# Patient Record
Sex: Female | Born: 1950
Health system: Southern US, Community
[De-identification: ages and names within clinical notes are randomized; demographics above are authoritative.]

## PROBLEM LIST (undated history)

## (undated) DIAGNOSIS — K5130 Ulcerative (chronic) rectosigmoiditis without complications: Secondary | ICD-10-CM

## (undated) DIAGNOSIS — G43909 Migraine, unspecified, not intractable, without status migrainosus: Secondary | ICD-10-CM

## (undated) DIAGNOSIS — D72819 Decreased white blood cell count, unspecified: Secondary | ICD-10-CM

## (undated) DIAGNOSIS — M1990 Unspecified osteoarthritis, unspecified site: Secondary | ICD-10-CM

## (undated) DIAGNOSIS — M810 Age-related osteoporosis without current pathological fracture: Secondary | ICD-10-CM

## (undated) DIAGNOSIS — K5190 Ulcerative colitis, unspecified, without complications: Secondary | ICD-10-CM

## (undated) DIAGNOSIS — M353 Polymyalgia rheumatica: Secondary | ICD-10-CM

## (undated) HISTORY — DX: Decreased white blood cell count, unspecified: D72.819

## (undated) HISTORY — DX: Age-related osteoporosis without current pathological fracture: M81.0

## (undated) HISTORY — PX: KNEE SURGERY: SHX244

## (undated) HISTORY — PX: HAND SURGERY: SHX662

## (undated) HISTORY — PX: COLONOSCOPY: SHX174

## (undated) HISTORY — DX: Unspecified osteoarthritis, unspecified site: M19.90

## (undated) HISTORY — DX: Ulcerative (chronic) rectosigmoiditis without complications: K51.30

## (undated) HISTORY — DX: Polymyalgia rheumatica: M35.3

---

## 2015-11-17 ENCOUNTER — Emergency Department: Payer: PRIVATE HEALTH INSURANCE

## 2015-11-17 ENCOUNTER — Encounter: Payer: Self-pay | Admitting: Emergency Medicine

## 2015-11-17 ENCOUNTER — Emergency Department
Admission: EM | Admit: 2015-11-17 | Discharge: 2015-11-17 | Disposition: A | Discharge status: Home / Self Care | Payer: PRIVATE HEALTH INSURANCE | Attending: Emergency Medicine | Admitting: Emergency Medicine

## 2015-11-17 DIAGNOSIS — M2550 Pain in unspecified joint: Secondary | ICD-10-CM | POA: Diagnosis not present

## 2015-11-17 DIAGNOSIS — Z79899 Other long term (current) drug therapy: Secondary | ICD-10-CM | POA: Insufficient documentation

## 2015-11-17 DIAGNOSIS — Z5181 Encounter for therapeutic drug level monitoring: Secondary | ICD-10-CM | POA: Insufficient documentation

## 2015-11-17 HISTORY — DX: Ulcerative colitis, unspecified, without complications: K51.90

## 2015-11-17 HISTORY — DX: Migraine, unspecified, not intractable, without status migrainosus: G43.909

## 2015-11-17 LAB — URINALYSIS COMPLETE WITH MICROSCOPIC (ARMC ONLY)
BACTERIA UA: NONE SEEN
Bilirubin Urine: NEGATIVE
GLUCOSE, UA: NEGATIVE mg/dL
Ketones, ur: NEGATIVE mg/dL
Leukocytes, UA: NEGATIVE
Nitrite: NEGATIVE
Protein, ur: NEGATIVE mg/dL
Specific Gravity, Urine: 1.014 (ref 1.005–1.030)
Squamous Epithelial / LPF: NONE SEEN
pH: 5 (ref 5.0–8.0)

## 2015-11-17 LAB — PROTIME-INR
INR: 1.09
Prothrombin Time: 14.3 seconds (ref 11.4–15.0)

## 2015-11-17 LAB — COMPREHENSIVE METABOLIC PANEL
ALBUMIN: 3.8 g/dL (ref 3.5–5.0)
ALK PHOS: 81 U/L (ref 38–126)
ALT: 13 U/L — ABNORMAL LOW (ref 14–54)
AST: 18 U/L (ref 15–41)
Anion gap: 8 (ref 5–15)
BILIRUBIN TOTAL: 0.9 mg/dL (ref 0.3–1.2)
BUN: 12 mg/dL (ref 6–20)
CALCIUM: 9.7 mg/dL (ref 8.9–10.3)
CO2: 26 mmol/L (ref 22–32)
CREATININE: 0.81 mg/dL (ref 0.44–1.00)
Chloride: 103 mmol/L (ref 101–111)
GFR calc Af Amer: >60 mL/min (ref >60)
GFR calc non Af Amer: >60 mL/min (ref >60)
GLUCOSE: 104 mg/dL — AB (ref 65–99)
Potassium: 3.8 mmol/L (ref 3.5–5.1)
Sodium: 137 mmol/L (ref 135–145)
TOTAL PROTEIN: 7.5 g/dL (ref 6.5–8.1)

## 2015-11-17 LAB — CBC WITH DIFFERENTIAL/PLATELET
BASOS ABS: 0 10*3/uL (ref 0–0.1)
BASOS PCT: 1 %
Eosinophils Absolute: 0.1 10*3/uL (ref 0–0.7)
Eosinophils Relative: 1 %
HEMATOCRIT: 36.4 % (ref 35.0–47.0)
HEMOGLOBIN: 12.5 g/dL (ref 12.0–16.0)
Lymphocytes Relative: 23 %
Lymphs Abs: 1.8 10*3/uL (ref 1.0–3.6)
MCH: 31 pg (ref 26.0–34.0)
MCHC: 34.2 g/dL (ref 32.0–36.0)
MCV: 90.5 fL (ref 80.0–100.0)
MONOS PCT: 12 %
Monocytes Absolute: 0.9 10*3/uL (ref 0.2–0.9)
NEUTROS ABS: 4.9 10*3/uL (ref 1.4–6.5)
NEUTROS PCT: 63 %
Platelets: 365 10*3/uL (ref 150–440)
RBC: 4.02 MIL/uL (ref 3.80–5.20)
RDW: 12 % (ref 11.5–14.5)
WBC: 7.8 10*3/uL (ref 3.6–11.0)

## 2015-11-17 LAB — CK: CK TOTAL: 54 U/L (ref 38–234)

## 2015-11-17 MED ORDER — CELECOXIB 100 MG PO CAPS: 100.0000 mg | ORAL_CAPSULE | Freq: Every day | ORAL | Status: AC

## 2015-11-17 NOTE — ED Provider Notes (Signed)
[facility] Emergency Department Provider Note        Time seen: ----------------------------------------- 3:52 PM on 11/17/2015 -----------------------------------------    I have reviewed the triage vital signs and the nursing notes.   HISTORY  Chief Complaint Joint Pain and Muscle Pain    HPI Terri Hudson is a 65 y.o. female who presents to ER with joint pain and stiffness in all of her major joints, including shoulders, knees, and hips. She denies fevers, chills, sweats, weight loss, chest pain, shortness of breath, vomiting or diarrhea. Patient states prior to the past 4 weeks she could do what she wanted to but now she has increased joint and muscle pain. Nothing is making better, she has particular pain when she raises her arms over her head.   Past Medical History  Diagnosis Date  . Migraine   . Ulcerative colitis ([place])     There are no active problems to display for this patient.   Past Surgical History  Procedure Laterality Date  . Knee surgery      Allergies Review of patient's allergies indicates no known allergies.  Social History Social History  Substance Use Topics  . Smoking status: Never Smoker   . Smokeless tobacco: None  . Alcohol Use: Yes     Comment: occasionally    Review of Systems Constitutional: Negative for fever. Eyes: Negative for visual changes. ENT: Negative for sore throat. Cardiovascular: Negative for chest pain. Respiratory: Negative for shortness of breath. Gastrointestinal: Negative for abdominal pain, vomiting and diarrhea. Genitourinary: Negative for dysuria. Musculoskeletal: Positive for joint pain Skin: Negative for rash. Neurological: Negative for headaches, focal weakness or numbness.  10-point ROS otherwise negative.  ____________________________________________   PHYSICAL EXAM:  VITAL SIGNS: ED Triage Vitals  Enc Vitals Group     BP 11/17/15 1540 135/73 mmHg     Pulse  Rate 11/17/15 1540 68     Resp 11/17/15 1540 18     Temp 11/17/15 1540 98.3 F (36.8 C)     Temp Source 11/17/15 1540 Oral     SpO2 11/17/15 1540 100 %     Weight 11/17/15 1540 145 lb (65.772 kg)     Height 11/17/15 1540 5' 8"  (1.727 m)     Head Cir --      Peak Flow --      Pain Score 11/17/15 1541 0     Pain Loc --      Pain Edu? --      Excl. in [place]? --     Constitutional: Alert and oriented. Well appearing and in no distress. Eyes: Conjunctivae are normal. PERRL. Normal extraocular movements. ENT   Head: Normocephalic and atraumatic.   Nose: No congestion/rhinnorhea.   Mouth/Throat: Mucous membranes are moist.   Neck: No stridor. Cardiovascular: Normal rate, regular rhythm. No murmurs, rubs, or gallops. Respiratory: Normal respiratory effort without tachypnea nor retractions. Breath sounds are clear and equal bilaterally. No wheezes/rales/rhonchi. Gastrointestinal: Soft and nontender. Normal bowel sounds Musculoskeletal: Mild pain with range of motion of her extremities, particularly upper extremities and overhead motion. Neurologic:  Normal speech and language. No gross focal neurologic deficits are appreciated.  Skin:  Skin is warm, dry and intact. No rash noted. Psychiatric: Mood and affect are normal. Speech and behavior are normal.  ___________________________________________  ED COURSE:  Pertinent labs & imaging results that were available during my care of the patient were reviewed by me and considered in my medical decision making (see chart for details).  No clear etiology for her symptoms, likely osteoarthritis. We'll check basic labs and imaging. ____________________________________________    LABS (pertinent positives/negatives)  Labs Reviewed  COMPREHENSIVE METABOLIC PANEL - Abnormal; Notable for the following:    Glucose, Bld 104 (*)    ALT 13 (*)    All other components within normal limits  URINALYSIS COMPLETEWITH MICROSCOPIC ([facility] ONLY) -  Abnormal; Notable for the following:    Color, Urine YELLOW (*)    APPearance CLEAR (*)    Hgb urine dipstick 1+ (*)    All other components within normal limits  CBC WITH DIFFERENTIAL/PLATELET  PROTIME-INR  CK  RHEUMATOID FACTOR    RADIOLOGY Images were viewed by me  Chest x-ray IMPRESSION: No edema or consolidation. ____________________________________________  FINAL ASSESSMENT AND PLAN  Arthralgia  Plan: Patient with labs and imaging as dictated above. No clear etiology for her arthritis symptoms specifically. I will refer her to rheumatology for outpatient follow-up. I will prescribe anti-inflammatory medications.   Earleen Newport, MD   Note: This dictation was prepared with Dragon dictation. Any transcriptional errors that result from this process are unintentional   Earleen Newport, MD 11/17/15 [phone]

## 2015-11-17 NOTE — ED Notes (Signed)
Pt complains of joint pain/stiffness worse in the morning, pt reports pain with raising arms above shoulders, pt denies fever and any other problems

## 2015-11-17 NOTE — ED Notes (Signed)
Patient presents to the ED with increased muscle and joint pain x 4 weeks.  Patient states, "I'm having difficulty picking things up off the floor and raising my arm above my head.  Before all this I was going to the gym daily."  Patient is in no obvious distress at this time.  Patient reports taking a beta blocker for migraine headaches and states she has taken these for years.

## 2015-11-17 NOTE — Discharge Instructions (Signed)
Musculoskeletal Pain Musculoskeletal pain is muscle and boney aches and pains. These pains can occur in any part of the body. Your caregiver may treat you without knowing the cause of the pain. They may treat you if blood or urine tests, X-rays, and other tests were normal.  CAUSES There is often not a definite cause or reason for these pains. These pains may be caused by a type of germ (virus). The discomfort may also come from overuse. Overuse includes working out too hard when your body is not fit. Boney aches also come from weather changes. Bone is sensitive to atmospheric pressure changes. HOME CARE INSTRUCTIONS   Ask when your test results will be ready. Make sure you get your test results.  Only take over-the-counter or prescription medicines for pain, discomfort, or fever as directed by your caregiver. If you were given medications for your condition, do not drive, operate machinery or power tools, or sign legal documents for 24 hours. Do not drink alcohol. Do not take sleeping pills or other medications that may interfere with treatment.  Continue all activities unless the activities cause more pain. When the pain lessens, slowly resume normal activities. Gradually increase the intensity and duration of the activities or exercise.  During periods of severe pain, bed rest may be helpful. Lay or sit in any position that is comfortable.  Putting ice on the injured area.  Put ice in a bag.  Place a towel between your skin and the bag.  Leave the ice on for 15 to 20 minutes, 3 to 4 times a day.  Follow up with your caregiver for continued problems and no reason can be found for the pain. If the pain becomes worse or does not go away, it may be necessary to repeat tests or do additional testing. Your caregiver may need to look further for a possible cause. SEEK IMMEDIATE MEDICAL CARE IF:  You have pain that is getting worse and is not relieved by medications.  You develop chest pain  that is associated with shortness or breath, sweating, feeling sick to your stomach (nauseous), or throw up (vomit).  Your pain becomes localized to the abdomen.  You develop any new symptoms that seem different or that concern you. MAKE SURE YOU:   Understand these instructions.  Will watch your condition.  Will get help right away if you are not doing well or get worse.   This information is not intended to replace advice given to you by your health care provider. Make sure you discuss any questions you have with your health care provider.   Document Released: 05/01/2005 Document Revised: 07/24/2011 Document Reviewed: 01/03/2013 Elsevier Interactive Patient Education 2016 [place] is a term that is commonly used to refer to joint pain or joint disease. There are more than 100 types of arthritis. CAUSES The most common cause of this condition is wear and tear of a joint. Other causes include:  Gout.  Inflammation of a joint.  An infection of a joint.  Sprains and other injuries near the joint.  A drug reaction or allergic reaction. In some cases, the cause may not be known. SYMPTOMS The main symptom of this condition is pain in the joint with movement. Other symptoms include:  Redness, swelling, or stiffness at a joint.  Warmth coming from the joint.  Fever.  Overall feeling of illness. DIAGNOSIS This condition may be diagnosed with a physical exam and tests, including:  Blood tests.  Urine tests.  Imaging tests, such as MRI, X-rays, or a CT scan. Sometimes, fluid is removed from a joint for testing. TREATMENT Treatment for this condition may involve:  Treatment of the cause, if it is known.  Rest.  Raising (elevating) the joint.  Applying cold or hot packs to the joint.  Medicines to improve symptoms and reduce inflammation.  Injections of a steroid such as cortisone into the joint to help reduce pain and  inflammation. Depending on the cause of your arthritis, you may need to make lifestyle changes to reduce stress on your joint. These changes may include exercising more and losing weight. HOME CARE INSTRUCTIONS Medicines  Take over-the-counter and prescription medicines only as told by your health care provider.  Do not take aspirin to relieve pain if gout is suspected. Activities  Rest your joint if told by your health care provider. Rest is important when your disease is active and your joint feels painful, swollen, or stiff.  Avoid activities that make the pain worse. It is important to balance activity with rest.  Exercise your joint regularly with range-of-motion exercises as told by your health care provider. Try doing low-impact exercise, such as:  Swimming.  Water aerobics.  Biking.  Walking. Joint Care  If your joint is swollen, keep it elevated if told by your health care provider.  If your joint feels stiff in the morning, try taking a warm shower.  If directed, apply heat to the joint. If you have diabetes, do not apply heat without permission from your health care provider.  Put a towel between the joint and the hot pack or heating pad.  Leave the heat on the area for 20-30 minutes.  If directed, apply ice to the joint:  Put ice in a plastic bag.  Place a towel between your skin and the bag.  Leave the ice on for 20 minutes, 2-3 times per day.  Keep all follow-up visits as told by your health care provider. This is important. SEEK MEDICAL CARE IF:  The pain gets worse.  You have a fever. SEEK IMMEDIATE MEDICAL CARE IF:  You develop severe joint pain, swelling, or redness.  Many joints become painful and swollen.  You develop severe back pain.  You develop severe weakness in your leg.  You cannot control your bladder or bowels.   This information is not intended to replace advice given to you by your health care provider. Make sure you discuss  any questions you have with your health care provider.   Document Released: 06/08/2004 Document Revised: 01/20/2015 Document Reviewed: 07/27/2014 Elsevier Interactive Patient Education [employer].

## 2015-11-18 LAB — RHEUMATOID FACTOR: RHEUMATOID FACTOR: 11.3 [IU]/mL (ref 0.0–13.9)

## 2017-06-07 DIAGNOSIS — M353 Polymyalgia rheumatica: Secondary | ICD-10-CM | POA: Insufficient documentation

## 2017-06-07 NOTE — Progress Notes (Addendum)
Office Visit Note  Patient: Terri Hudson             Date of Birth: 01-Jul-1950           MRN: [id number]             PCP: Maurice Small, MD Referring: Maurice Small, MD Visit Date: 06/11/2017 Occupation: Self employed coach    Subjective:  Joint and muscle pain   History of Present Illness: Terri Hudson is a 67 y.o. female seen in consultation per request of Dr. Justin Mend. According to patient about 2 years ago while she was living in [place] she started having difficulty turning over in bed due to shoulder joint pain. She also had difficulty bending over and getting out of the bathtub. At the time she was seen by her PCP who referred her to a rheumatologist. The rheumatologist after evaluation diagnosed with polymyalgia rheumatica. She was started on 15 mg of prednisone a day. It is gradually tapered over 12 months. When she reached the dose of 5 mg her symptoms recurred. The prednisone dose was again increased to 15 mg with gradual taper. She moved to [facility] in July 2018 at the time she was on prednisone 10 mg a day. She's been tapering her prednisone by 1 mg every month. She states when she reaches 5 mg again she started having aches and pains and recurrence of symptoms. She decided to continue to taper her prednisone and is currently on 2 mg a day. She's been having pain in her neck, shoulders, across her chest, hips, knee joints, elbows. She denies any joint swelling. She also has myalgias. She has difficulty getting out of chair. She has difficulty shampooing her hair. She was also placed on alendronate and omeprazole as a preventive therapy about 2 years ago by her rheumatologist. She does not recall having a DEXA scan. She was referred to physical therapy by Dr. Justin Mend for muscle strengthening. She's been going there on regular basis.  She states that it 18 she was diagnosed with Reiter's syndrome at the time she had pain and swelling in her right knee joint with gradually resolve  by itself.  She also has known history of ulcerative colitis for the last 30 years. She was initially under care of GI doctor in [place]. She states her last colonoscopy was 3 years ago and she was discharged from [facility] care. Her symptoms have been stable on mesalazine. Her last flare was about 3 years ago. It was treated with steroid enemas.  Activities of Daily Living:  Patient reports morning stiffness for all day minutes.   Patient Denies nocturnal pain.  Difficulty dressing/grooming: Denies Difficulty climbing stairs: Reports Difficulty getting out of chair: Reports Difficulty using hands for taps, buttons, cutlery, and/or writing: Denies   Review of Systems  Constitutional: Positive for fatigue. Negative for night sweats, weight gain, weight loss and weakness.  HENT: Negative for mouth sores, trouble swallowing, trouble swallowing, mouth dryness and nose dryness.   Eyes: Negative for pain, redness, visual disturbance and dryness.  Respiratory: Negative for cough, shortness of breath and difficulty breathing.   Cardiovascular: Negative for chest pain, palpitations, hypertension, irregular heartbeat and swelling in legs/feet.  Gastrointestinal: Negative for blood in stool, constipation and diarrhea.  Endocrine: Negative for increased urination.  Genitourinary: Negative for vaginal dryness.  Musculoskeletal: Positive for arthralgias, joint pain, myalgias, muscle weakness, morning stiffness, muscle tenderness and myalgias. Negative for joint swelling.  Skin: Negative for color change, rash, hair loss,  skin tightness, ulcers and sensitivity to sunlight.  Allergic/Immunologic: Negative for susceptible to infections.  Neurological: Negative for dizziness, headaches, memory loss and night sweats.  Hematological: Negative for swollen glands.  Psychiatric/Behavioral: Negative for depressed mood and sleep disturbance. The patient is not nervous/anxious.     PMFS History:  Patient Active Problem  List   Diagnosis Date Noted  . Polymyalgia rheumatica ([place]) 06/07/2017    Past Medical History:  Diagnosis Date  . Migraine   . Ulcerative colitis ([place])     Family History  Problem Relation Age of Onset  . Heart disease Mother   . Cancer Father        throat   . Migraines Daughter   . Bowel Disease Son    Past Surgical History:  Procedure Laterality Date  . KNEE SURGERY Right    Social History   Social History Narrative  . Not on file     Objective: Vital Signs: BP 131/73 (BP Location: Left Arm, Patient Position: Sitting, Cuff Size: Normal)   Pulse (!) 52   Resp 16   Ht 5' 8"  (1.727 m)   Wt 153 lb (69.4 kg)   BMI 23.26 kg/m    Physical Exam  Constitutional: She is oriented to person, place, and time. She appears well-developed and well-nourished.  HENT:  Head: Normocephalic and atraumatic.  Eyes: Conjunctivae and EOM are normal.  Neck: Normal range of motion.  Cardiovascular: Normal rate, regular rhythm, normal heart sounds and intact distal pulses.  Pulmonary/Chest: Effort normal and breath sounds normal.  Abdominal: Soft. Bowel sounds are normal.  Lymphadenopathy:    She has no cervical adenopathy.  Neurological: She is alert and oriented to person, place, and time.  Skin: Skin is warm and dry. Capillary refill takes less than 2 seconds.  Psychiatric: She has a normal mood and affect. Her behavior is normal.  Nursing note and vitals reviewed.    Musculoskeletal Exam: C-spine discomfort with range of motion. She is very limited range of motion of lumbar spine. She has discomfort with range of motion of bilateral shoulders abduction limited to 90. Elbow joints wrist joints MCPs PIPs with good range of motion. She has DIP thickening in her bilateral hands consistent with osteoarthritis. She is painful range of motion of bilateral hip joints and bilateral knee joints. No warmth swelling or effusion was noted. She is some osteoarthritic changes in her feet.  CDAI  Exam: No CDAI exam completed.    Investigation: No additional findings.  Component     Latest Ref Rng & Units 11/17/2015  RA Latex Turbid.     0.0 - 13.9 IU/mL 11.3  CK Total     38 - 234 U/L 54   CBC Latest Ref Rng & Units 11/17/2015  WBC 3.6 - 11.0 K/uL 7.8  Hemoglobin 12.0 - 16.0 g/dL 12.5  Hematocrit 35.0 - 47.0 % 36.4  Platelets 150 - 440 K/uL 365   CMP Latest Ref Rng & Units 11/17/2015  Glucose 65 - 99 mg/dL 104(H)  BUN 6 - 20 mg/dL 12  Creatinine 0.44 - 1.00 mg/dL 0.81  Sodium 135 - 145 mmol/L 137  Potassium 3.5 - 5.1 mmol/L 3.8  Chloride 101 - 111 mmol/L 103  CO2 22 - 32 mmol/L 26  Calcium 8.9 - 10.3 mg/dL 9.7  Total Protein 6.5 - 8.1 g/dL 7.5  Total Bilirubin 0.3 - 1.2 mg/dL 0.9  Alkaline Phos 38 - 126 U/L 81  AST 15 - 41 U/L 18  ALT  14 - 54 U/L 13(L)    Imaging: Xr Hip Unilat W Or W/o Pelvis 2-3 Views Left  Result Date: 06/11/2017 No hip joint narrowing noted, no chondrocalcinosis was noted. No SI joint changes were noted. Impression: Unremarkable x-ray of the hip joint.  Xr Hip Unilat W Or W/o Pelvis 2-3 Views Right  Result Date: 06/11/2017 No hip joint narrowing noted, no chondrocalcinosis was noted. No SI joint changes were noted. Impression: Unremarkable x-ray of the hip joint.  Xr Cervical Spine 2 Or 3 Views  Result Date: 06/11/2017 Anterior spurring was noted. No significant disc space narrowing was noted. Impression Lennox Grumbles is spondylosis of the cervical spine.  Xr Knee 3 View Left  Result Date: 06/11/2017 Moderate medial compartment narrowing was noted. Intercondylar osteophytes were noted. No chondrocalcinosis was noted. Moderate patellofemoral narrowing was noted. Impression: Moderate osteoarthritis and moderate chondromalacia patella of the knee joint.  Xr Knee 3 View Right  Result Date: 06/11/2017 Moderate medial compartment narrowing was noted. Intercondylar osteophytes were noted. No chondrocalcinosis was noted. Moderate patellofemoral narrowing  was noted. Impression: Moderate osteoarthritis and moderate chondromalacia patella of the knee joint.  Xr Lumbar Spine 2-3 Views  Result Date: 06/11/2017 Anterior spurring and facet joint arthropathy was noted. No significant discussed this narrowing was noted. Impression: These findings are consistent with mild spondylosis of the lumbar spine   Speciality Comments: No specialty comments available.    Procedures:  No procedures performed Allergies: Patient has no known allergies.   Assessment / Plan:     Visit Diagnoses: Polymyalgia rheumatica ([place]) - Prednisone 20 mg taper (started on 05/25/17) patient reports that she did not increase her prednisone to 20 mg and stayed on 2 mg a day. She was diagnosed with PMR in [place] about 2 years ago. She flared both times when she tapered her prednisone down to 5 mg. Currently she is having difficulty raising her arm and difficulty getting out of chair. The differential diagnosis will be the PMR versus inflammatory arthritis which could be associated with ulcerative colitis. I do not see any synovitis on examination today. Today she is difficulty getting out of the chair and raising her arms above 90. I would like to obtain following labs to confirm her diagnosis. If she does have polymyalgia and had 2 relapses then we should consider adding methotrexate. Indications side effects contraindications of methotrexate were briefly discussed. A handout was given consent was taken in case feet to start her on methotrexate along with prednisone. The risk of partially and untreated PMR was also discussed. I made her aware of the symptoms of temporal arteritis . If she develops any symptoms she should notify us. With the diagnosis of PMR is confirmed she will be starting on methotrexate 4 tablets by mouth every week. He may be able to increase it to 6 tablets by mouth every week. We will monitor labs every 2 weeks 2 and then every 2 months to monitor for drug toxicity. We  will call and folic acid 1 mg by mouth daily.  Pain bilateral shoulders: She has difficulty raising her arms.  Neck pain - Plan: XR Cervical Spine 2 or 3 views. The x-rays revealed mild is spondylosis.  Chronic midline low back pain without sciatica. She is very limited range of motion of her lumbar spine. - Plan: XR Lumbar Spine 2-3 Views. X-rays revealed mild spondylosis.  Chronic pain of both hips - Plan: XR HIP UNILAT W OR W/O PELVIS 2-3 VIEWS RIGHT, XR HIP UNILAT W OR  W/O PELVIS 2-3 VIEWS LEFT. The x-rays of bilateral hip joints were unremarkable.  Chronic pain of both knees. No warmth swelling or effusion was noted. - Plan: XR KNEE 3 VIEW RIGHT, XR KNEE 3 VIEW LEFT. The x-rays of knee joint showed moderate osteoarthritis.  Age-related osteoporosis without current pathological fracture. Patient reports she never had a DEXA scan. We will get DEXA today. - Alendronate 70 mg po qd stopped 61month ago due to bone pain. She was placed on alendronate for patient for prophylactic therapy.  Ulcerative colitis, chronic, unspecified complication (HCC) - Mesalazine 500 mg 1 tablet po BID daily. Under control per patient. She's not followed up by gastroenterology currently.  Hx of migraines : Controlled with propranolol.   Orders: Orders Placed This Encounter  Procedures  . XR Cervical Spine 2 or 3 views  . XR Lumbar Spine 2-3 Views  . XR HIP UNILAT W OR W/O PELVIS 2-3 VIEWS RIGHT  . XR HIP UNILAT W OR W/O PELVIS 2-3 VIEWS LEFT  . XR KNEE 3 VIEW RIGHT  . XR KNEE 3 VIEW LEFT  . DG Chest 2 View  . DG BONE DENSITY (DXA)  . CBC with Differential/Platelet  . COMPLETE METABOLIC PANEL WITH GFR  . Urinalysis, Routine w reflex microscopic  . Sedimentation rate  . CK  . TSH  . ANA  . Rheumatoid factor  . Cyclic citrul peptide antibody, IgG  . HIV antibody  . QuantiFERON-TB Gold Plus  . Serum protein electrophoresis with reflex  . IgG, IgA, IgM  . Hepatitis panel, acute   No orders of the  defined types were placed in this encounter.   Face-to-face time spent with patient was 60 minutes. Greater than 50% of time was spent in counseling and coordination of care.  Follow-Up Instructions: Return for Polymyalgia rheumatica.   SBo Merino MD  Note - This record has been created using DEditor, commissioning  Chart creation errors have been sought, but may not always  have been located. Such creation errors do not reflect on  the standard of medical care.

## 2017-06-11 ENCOUNTER — Ambulatory Visit (INDEPENDENT_AMBULATORY_CARE_PROVIDER_SITE_OTHER): Payer: Self-pay

## 2017-06-11 ENCOUNTER — Ambulatory Visit: Payer: BLUE CROSS/BLUE SHIELD | Admitting: Rheumatology

## 2017-06-11 ENCOUNTER — Ambulatory Visit (HOSPITAL_COMMUNITY)
Admission: RE | Admit: 2017-06-11 | Discharge: 2017-06-11 | Disposition: A | Discharge status: Home / Self Care | Payer: BLUE CROSS/BLUE SHIELD | Source: Ambulatory Visit | Attending: Rheumatology | Admitting: Rheumatology

## 2017-06-11 ENCOUNTER — Encounter: Payer: Self-pay | Admitting: Rheumatology

## 2017-06-11 VITALS — BP 131/73 | HR 52 | Resp 16 | Ht 68.0 in | Wt 153.0 lb

## 2017-06-11 DIAGNOSIS — M25561 Pain in right knee: Secondary | ICD-10-CM

## 2017-06-11 DIAGNOSIS — Z8669 Personal history of other diseases of the nervous system and sense organs: Secondary | ICD-10-CM

## 2017-06-11 DIAGNOSIS — R918 Other nonspecific abnormal finding of lung field: Secondary | ICD-10-CM | POA: Insufficient documentation

## 2017-06-11 DIAGNOSIS — M810 Age-related osteoporosis without current pathological fracture: Secondary | ICD-10-CM

## 2017-06-11 DIAGNOSIS — M545 Low back pain, unspecified: Secondary | ICD-10-CM

## 2017-06-11 DIAGNOSIS — M25552 Pain in left hip: Secondary | ICD-10-CM

## 2017-06-11 DIAGNOSIS — G8929 Other chronic pain: Secondary | ICD-10-CM

## 2017-06-11 DIAGNOSIS — M353 Polymyalgia rheumatica: Secondary | ICD-10-CM

## 2017-06-11 DIAGNOSIS — M25551 Pain in right hip: Secondary | ICD-10-CM

## 2017-06-11 DIAGNOSIS — M25562 Pain in left knee: Secondary | ICD-10-CM

## 2017-06-11 DIAGNOSIS — K51919 Ulcerative colitis, unspecified with unspecified complications: Secondary | ICD-10-CM

## 2017-06-11 DIAGNOSIS — M542 Cervicalgia: Secondary | ICD-10-CM

## 2017-06-11 MED ORDER — PREDNISONE 5 MG PO TABS: 10.0000 mg | ORAL_TABLET | Freq: Every day | ORAL | 0 refills | Status: DC

## 2017-06-11 NOTE — Addendum Note (Signed)
Addended by: Carole Binning on: 06/11/2017 11:20 AM   Modules accepted: Orders

## 2017-06-11 NOTE — Patient Instructions (Signed)
Please go to [facility] Radiology department to have a chest xray done.   Methotrexate tablets What is this medicine? METHOTREXATE (METH oh TREX ate) is a chemotherapy drug used to treat cancer including breast cancer, leukemia, and lymphoma. This medicine can also be used to treat psoriasis and certain kinds of arthritis. This medicine may be used for other purposes; ask your health care provider or pharmacist if you have questions. COMMON BRAND NAME(S): Rheumatrex, Trexall What should I tell my health care provider before I take this medicine? They need to know if you have any of these conditions: -fluid in the stomach area or lungs -if you often drink alcohol -infection or immune system problems -kidney disease or on hemodialysis -liver disease -low blood counts, like low white cell, platelet, or red cell counts -lung disease -radiation therapy -stomach ulcers -ulcerative colitis -an unusual or allergic reaction to methotrexate, other medicines, foods, dyes, or preservatives -pregnant or trying to get pregnant -breast-feeding How should I use this medicine? Take this medicine by mouth with a glass of water. Follow the directions on the prescription label. Take your medicine at regular intervals. Do not take it more often than directed. Do not stop taking except on your doctor's advice. Make sure you know why you are taking this medicine and how often you should take it. If this medicine is used for a condition that is not cancer, like arthritis or psoriasis, it should be taken weekly, NOT daily. Taking this medicine more often than directed can cause serious side effects, even death. Talk to your healthcare provider about safe handling and disposal of this medicine. You may need to take special precautions. Talk to your pediatrician regarding the use of this medicine in children. While this drug may be prescribed for selected conditions, precautions do apply. Overdosage: If you  think you have taken too much of this medicine contact a poison control center or emergency room at once. NOTE: This medicine is only for you. Do not share this medicine with others. What if I miss a dose? If you miss a dose, talk with your doctor or health care professional. Do not take double or extra doses. What may interact with this medicine? This medicine may interact with the following medication: -acitretin -aspirin and aspirin-like medicines including salicylates -azathioprine -certain antibiotics like penicillins, tetracycline, and chloramphenicol -cyclosporine -gold -hydroxychloroquine -live virus vaccines -NSAIDs, medicines for pain and inflammation, like ibuprofen or naproxen -other cytotoxic agents -penicillamine -phenylbutazone -phenytoin -probenecid -retinoids such as isotretinoin and tretinoin -steroid medicines like prednisone or cortisone -sulfonamides like sulfasalazine and trimethoprim/sulfamethoxazole -theophylline This list may not describe all possible interactions. Give your health care provider a list of all the medicines, herbs, non-prescription drugs, or dietary supplements you use. Also tell them if you smoke, drink alcohol, or use illegal drugs. Some items may interact with your medicine. What should I watch for while using this medicine? Avoid alcoholic drinks. This medicine can make you more sensitive to the sun. Keep out of the sun. If you cannot avoid being in the sun, wear protective clothing and use sunscreen. Do not use sun lamps or tanning beds/booths. You may need blood work done while you are taking this medicine. Call your doctor or health care professional for advice if you get a fever, chills or sore throat, or other symptoms of a cold or flu. Do not treat yourself. This drug decreases your body's ability to fight infections. Try to avoid being around people who are sick. This  medicine may increase your risk to bruise or bleed. Call your doctor  or health care professional if you notice any unusual bleeding. Check with your doctor or health care professional if you get an attack of severe diarrhea, nausea and vomiting, or if you sweat a lot. The loss of too much body fluid can make it dangerous for you to take this medicine. Talk to your doctor about your risk of cancer. You may be more at risk for certain types of cancers if you take this medicine. Both men and women must use effective birth control with this medicine. Do not become pregnant while taking this medicine or until at least 1 normal menstrual cycle has occurred after stopping it. Women should inform their doctor if they wish to become pregnant or think they might be pregnant. Men should not father a child while taking this medicine and for 3 months after stopping it. There is a potential for serious side effects to an unborn child. Talk to your health care professional or pharmacist for more information. Do not breast-feed an infant while taking this medicine. What side effects may I notice from receiving this medicine? Side effects that you should report to your doctor or health care professional as soon as possible: -allergic reactions like skin rash, itching or hives, swelling of the face, lips, or tongue -breathing problems or shortness of breath -diarrhea -dry, nonproductive cough -low blood counts - this medicine may decrease the number of white blood cells, red blood cells and platelets. You may be at increased risk for infections and bleeding. -mouth sores -redness, blistering, peeling or loosening of the skin, including inside the mouth -signs of infection - fever or chills, cough, sore throat, pain or trouble passing urine -signs and symptoms of bleeding such as bloody or black, tarry stools; red or dark-brown urine; spitting up blood or brown material that looks like coffee grounds; red spots on the skin; unusual bruising or bleeding from the eye, gums, or nose -signs  and symptoms of kidney injury like trouble passing urine or change in the amount of urine -signs and symptoms of liver injury like dark yellow or brown urine; general ill feeling or flu-like symptoms; light-colored stools; loss of appetite; nausea; right upper belly pain; unusually weak or tired; yellowing of the eyes or skin Side effects that usually do not require medical attention (report to your doctor or health care professional if they continue or are bothersome): -dizziness -hair loss -tiredness -upset stomach -vomiting This list may not describe all possible side effects. Call your doctor for medical advice about side effects. You may report side effects to FDA at 1-800-FDA-1088. Where should I keep my medicine? Keep out of the reach of children. Store at room temperature between 20 and 25 degrees C (68 and 77 degrees F). Protect from light. Throw away any unused medicine after the expiration date. NOTE: This sheet is a summary. It may not cover all possible information. If you have questions about this medicine, talk to your doctor, pharmacist, or health care provider.  2018 Elsevier/Gold Standard (2015-01-04 05:39:22)

## 2017-06-12 NOTE — Progress Notes (Signed)
Her labs are consistent with rheumatoid arthritis. We should be able to start her on methotrexate once all the labs are available.she can schedule an appointment at [facility] to discuss lab values and starting medication.

## 2017-06-12 NOTE — Progress Notes (Signed)
Chest x-ray normal

## 2017-06-12 NOTE — Progress Notes (Signed)
Will discuss at follow-up.

## 2017-06-13 NOTE — Progress Notes (Signed)
Based on her GFR I would suggest starting on Arava 10 mg by mouth daily for 2 weeks if labs are stable then we can increase it to 20 mg by mouth daily. Recheck labs in 2 weeks 2 and then every 2 months. I would avoid methotrexate due to low GFR.

## 2017-06-15 NOTE — Progress Notes (Signed)
Office Visit Note  Patient: Terri Hudson             Date of Birth: [birth year]           MRN: [id number]             PCP: Maurice Small, MD Referring: Maurice Small, MD Visit Date: 06/18/2017 Occupation: _0 @    Subjective:    History of Present Illness: Terri Hudson is a 67 y.o. female  Prednisone 10 mg  3rd taper in 2 year period  Improved pain in hsoulders, hips, and knees  No hand or feet pain at this time  Occasional hand pain  Plays piano  No joint swelling  Joint stiffness in knees   Activities of Daily Living:  Patient reports morning stiffness for 0 minutes.   Patient Denies nocturnal pain.  Difficulty dressing/grooming: Denies Difficulty climbing stairs: Denies Difficulty getting out of chair: Denies Difficulty using hands for taps, buttons, cutlery, and/or writing: Denies   Review of Systems  Constitutional: Negative for fatigue and weakness.  HENT: Negative for mouth sores, mouth dryness and nose dryness.   Eyes: Negative for pain, redness, visual disturbance and dryness.  Respiratory: Negative for cough, hemoptysis, shortness of breath, apnea and difficulty breathing.   Cardiovascular: Negative for chest pain, palpitations, hypertension, irregular heartbeat and swelling in legs/feet.  Gastrointestinal: Negative for blood in stool, constipation and diarrhea.  Endocrine: Negative for increased urination.  Genitourinary: Negative for painful urination.  Musculoskeletal: Negative for arthralgias, joint pain, joint swelling, myalgias, muscle weakness, morning stiffness, muscle tenderness and myalgias.  Skin: Positive for color change (Raynaud's). Negative for pallor, rash, hair loss, nodules/bumps, redness, skin tightness, ulcers and sensitivity to sunlight.  Allergic/Immunologic: Negative for susceptible to infections.  Neurological: Negative for dizziness, numbness and headaches.  Hematological: Negative for swollen glands.    Psychiatric/Behavioral: Negative for depressed mood and sleep disturbance. The patient is not nervous/anxious.     PMFS History:  Patient Active Problem List   Diagnosis Date Noted  . Polymyalgia rheumatica ([place]) 06/07/2017    Past Medical History:  Diagnosis Date  . Migraine   . Ulcerative colitis ([place])     Family History  Problem Relation Age of Onset  . Heart disease Mother   . Cancer Father        throat   . Migraines Daughter   . Bowel Disease Son    Past Surgical History:  Procedure Laterality Date  . KNEE SURGERY Right    Social History   Social History Narrative  . Not on file     Objective: Vital Signs: BP 122/72 (BP Location: Right Arm, Patient Position: Sitting, Cuff Size: Normal)   Pulse (!) 52   Resp 16   Ht _1  (1.727 m)   Wt 155 lb (70.3 kg)   BMI 23.57 kg/m    Physical Exam  Constitutional: She is oriented to person, place, and time. She appears well-developed and well-nourished.  HENT:  Head: Normocephalic and atraumatic.  Eyes: Conjunctivae and EOM are normal.  Neck: Normal range of motion.  Cardiovascular: Normal rate, regular rhythm, normal heart sounds and intact distal pulses.  Pulmonary/Chest: Effort normal and breath sounds normal.  Abdominal: Soft. Bowel sounds are normal.  Lymphadenopathy:    She has no cervical adenopathy.  Neurological: She is alert and oriented to person, place, and time.  Skin: Skin is warm and dry. Capillary refill takes less than 2 seconds.  Psychiatric: She has a normal mood and  affect. Her behavior is normal.  Nursing note and vitals reviewed.    Musculoskeletal Exam: C-spine limited ROM with lateral rotation with mild discomfort. Thoracic and lumbar spine good ROM.  Shoulder joints, elbow joints, wrist joints, MCPs, PIPs, and DIPs good ROM with no synovitis.  No discomfort with shoulder ROM.  She has mild DIP synovial thickening.  Hip joints, knee joints, ankle joints, MTPs, PIPs, and DIPs good ROM with  no synovitis. No discomfort with hip ROM.  She has crepitus of bilateral knees.  No warmth or effusion of knees. She has a bunion bilaterally on first MTP.  No tenderness of MTPs.  No midline spinal tenderness.  No SI joint tenderness.  No trochanteric bursitis.    CDAI Exam: No CDAI exam completed.    Investigation: No additional findings.  Component     Latest Ref Rng & Units 06/11/2017  Color, Urine     YELLOW YELLOW  Appearance     CLEAR CLEAR  Specific Gravity, Urine     1.001 - 1.03 1.010  pH     5.0 - 8.0 7.5  Glucose     NEGATIVE NEGATIVE  Bilirubin Urine     NEGATIVE NEGATIVE  Ketones, ur     NEGATIVE NEGATIVE  Hgb urine dipstick     NEGATIVE NEGATIVE  Protein     NEGATIVE NEGATIVE  Nitrite     NEGATIVE NEGATIVE  Leukocytes, UA     NEGATIVE NEGATIVE  Total Protein     6.1 - 8.1 g/dL 7.4  Albumin ELP     3.8 - 4.8 g/dL 4.3  Alpha 1     0.2 - 0.3 g/dL 0.3  Alpha 2     0.5 - 0.9 g/dL 0.8  Beta Globulin     0.4 - 0.6 g/dL 0.4  Beta 2     0.2 - 0.5 g/dL 0.4  Gamma Globulin     0.8 - 1.7 g/dL 1.2  SPE Interp.        Hep A Ab, IgM     NON-REACTI NON-REACTIVE  Hepatitis B Surface Ag     NON-REACTI NON-REACTIVE  Hep B Core Ab, IgM     NON-REACTI NON-REACTIVE  Hepatitis C Ab     NON-REACTI NON-REACTIVE  SIGNAL TO CUT-OFF     <1.00 0.03  Immunoglobulin A     81 - 463 mg/dL 238  IgG (Immunoglobin G), Serum     694 - 1,618 mg/dL 1,205  IgM, Serum     48 - 271 mg/dL 121  HIV     NON-REACTI NON-REACTIVE  Cyclic Citrullin Peptide Ab     UNITS 176 (H)  RA Latex Turbid.     <14 IU/mL <14  Anit Nuclear Antibody(ANA)     NEGATIVE NEGATIVE  TSH     0.40 - 4.50 mIU/L 0.87  CK Total     29 - 143 U/L 72  Sed Rate     0 - 30 mm/h 14   CBC Latest Ref Rng & Units 06/11/2017 11/17/2015  WBC 3.8 - 10.8 Thousand/uL 8.6 7.8  Hemoglobin 11.7 - 15.5 g/dL 12.7 12.5  Hematocrit 35.0 - 45.0 % 38.2 36.4  Platelets 140 - 400 Thousand/uL 324 365   CMP Latest Ref Rng  & Units 06/11/2017 06/11/2017 11/17/2015  Glucose 65 - 99 mg/dL 103(H) - 104(H)  BUN 7 - 25 mg/dL 15 - 12  Creatinine 0.50 - 0.99 mg/dL 1.03(H) - 0.81  Sodium 135 - 146 mmol/L 139 -  137  Potassium 3.5 - 5.3 mmol/L 4.5 - 3.8  Chloride 98 - 110 mmol/L 103 - 103  CO2 20 - 32 mmol/L 30 - 26  Calcium 8.6 - 10.4 mg/dL 10.1 - 9.7  Total Protein 6.1 - 8.1 g/dL 7.5 7.4 7.5  Total Bilirubin 0.2 - 1.2 mg/dL 0.6 - 0.9  Alkaline Phos 38 - 126 U/L - - 81  AST 10 - 35 U/L 18 - 18  ALT 6 - 29 U/L 14 - 13(L)    Imaging: Dg Chest 2 View  Result Date: 06/11/2017 CLINICAL DATA:  Polymyalgia rheumatica EXAM: CHEST  2 VIEW COMPARISON:  11/17/2015 FINDINGS: The cardiac silhouette, mediastinal and hilar contours are normal and stable. The lungs demonstrate fairly marked hyperinflation. No infiltrates, edema or effusions. No worrisome pulmonary lesions. The bony thorax is intact. IMPRESSION: Hyperinflation but no acute pulmonary findings. Electronically Signed   By: Marijo Sanes M.D.   On: 06/11/2017 17:46   Xr Hip Unilat W Or W/o Pelvis 2-3 Views Left  Result Date: 06/11/2017 No hip joint narrowing noted, no chondrocalcinosis was noted. No SI joint changes were noted. Impression: Unremarkable x-ray of the hip joint.  Xr Hip Unilat W Or W/o Pelvis 2-3 Views Right  Result Date: 06/11/2017 No hip joint narrowing noted, no chondrocalcinosis was noted. No SI joint changes were noted. Impression: Unremarkable x-ray of the hip joint.  Xr Cervical Spine 2 Or 3 Views  Result Date: 06/11/2017 Anterior spurring was noted. No significant disc space narrowing was noted. Impression Lennox Grumbles is spondylosis of the cervical spine.  Xr Knee 3 View Left  Result Date: 06/11/2017 Moderate medial compartment narrowing was noted. Intercondylar osteophytes were noted. No chondrocalcinosis was noted. Moderate patellofemoral narrowing was noted. Impression: Moderate osteoarthritis and moderate chondromalacia patella of the knee  joint.  Xr Knee 3 View Right  Result Date: 06/11/2017 Moderate medial compartment narrowing was noted. Intercondylar osteophytes were noted. No chondrocalcinosis was noted. Moderate patellofemoral narrowing was noted. Impression: Moderate osteoarthritis and moderate chondromalacia patella of the knee joint.  Xr Lumbar Spine 2-3 Views  Result Date: 06/11/2017 Anterior spurring and facet joint arthropathy was noted. No significant discussed this narrowing was noted. Impression: These findings are consistent with mild spondylosis of the lumbar spine   Speciality Comments: No specialty comments available.     Procedures:  No procedures performed Allergies: Patient has no known allergies.   Assessment / Plan:     Visit Diagnoses: Rheumatoid arthritis with rheumatoid factor of multiple sites without organ or systems involvement [facility]): She has no synovitis on exam.  No MTP tenderness.  She has DIP synovial thickening of bilateral hands and feet consistent with osteoarthritic changes.  She has no tenderness of MCPs or MTPs.  She is currently on Prednisone 10 mg daily.  We discussed her recent lab work and positive CCP (176) on 06/11/17.  She denies any joint pain.  She was hesitant to start a medication due to not having any clinical features of RA at this time but she agreed to start [place] today.  She was given a handout with information regarding rheumatoid arthritis and anti-ccp.  We discussed starting Arava 10 mg po daily.  We will recheck labs in 2 weeks x2 and then every 2 months.  A 30-day supply of Arava was sent to the pharmacy.  TB gold was drawn today. All questions were addressed and consent was taken.    Medication counseling:  TB Gold: Pending 06/18/17  Pregnancy status: Post-menopausal  Patient was counseled on the purpose, proper use, and adverse effects of leflunomide including risk of infection, nausea/diarrhea/weight loss, increase in blood pressure, rash, hair loss, tingling in  the hands and feet, and signs and symptoms of interstitial lung disease.  Discussed the importance of frequent monitoring of liver function and blood counts, and patient was provided with instructions for standing labs.  Discussed importance of birth control while on leflunomide due to risk of congenital abnormalities, and patient confirms she is postmenopausal.  Provided patient with educational materials on leflunomide and answered all questions.  Patient consented to [place] use, and consent will be uploaded into the media tab.    Polymyalgia rheumatica ([place]): ESR on 06/01/17 was WNL.  She is on Prednisone 10 mg po daily.  She has noticed significant improvement since increasing her Prednisone dose from 2 mg daily to 10 mg daily.  She has no muscle weakness or muscle tenderness at this time.  She has full ROM of bilateral hips and shoulders.  She has no temporal tenderness.  X-rays of bilateral hips were unremarkable on 06/11/17.  High risk medication use - CXR normal on 06/11/17. TB gold was drawn today.  GFR was low at 57 and creatinine was 1.03 on 06/11/17.  We will continue to monitor her renal function.  She will return in 2 weeks for lab work.  She will start on Arava 10 mg daily.  If her labs are stable in 2 weeks, we will increase it to Arava 20 mg daily. - Plan: QuantiFERON-TB Gold Plus, CBC with Differential/Platelet, COMPLETE METABOLIC PANEL WITH GFR  On prednisone therapy - 10 mg daily.  She has noticed improvement in muscle strengthening and muscle tenderness.  She has no synovitis on exam.     Primary osteoarthritis of both knees: She has bilateral knee crepitus.  No warmth or effusion.  She has occasional discomfort and stiffness.    Ulcerative colitis, chronic, unspecified complication (HCC) - Mesalazine 500 mg 1 tablet po BID daily.  No recent flares.  She is currently not followed by a GI specialist.    Spondylosis of lumbar spine: She continues to have slightly limited ROM, but her ROM is  improved since increasing her dose of Prednisone.    Orders: Orders Placed This Encounter  Procedures  . QuantiFERON-TB Gold Plus  . CBC with Differential/Platelet  . COMPLETE METABOLIC PANEL WITH GFR   Meds ordered this encounter  Medications  . leflunomide (ARAVA) 10 MG tablet    Sig: Take 1 tablet (10 mg total) by mouth daily.    Dispense:  30 tablet    Refill:  0    Face-to-face time spent with patient was 45 minutes. Greater than 50% of time was spent in counseling and coordination of care.  Follow-Up Instructions: Return in about 2 months (around 08/16/2017) for Polymyalgia Rheumatica, Rheumatoid arthritis, Osteoarthritis.   Ofilia Neas, PA-C  Note - This record has been created using Dragon software.  Chart creation errors have been sought, but may not always  have been located. Such creation errors do not reflect on  the standard of medical care.

## 2017-06-18 ENCOUNTER — Ambulatory Visit: Payer: BLUE CROSS/BLUE SHIELD | Admitting: Physician Assistant

## 2017-06-18 ENCOUNTER — Encounter: Payer: Self-pay | Admitting: Physician Assistant

## 2017-06-18 VITALS — BP 122/72 | HR 52 | Resp 16 | Ht 68.0 in | Wt 155.0 lb

## 2017-06-18 DIAGNOSIS — M353 Polymyalgia rheumatica: Secondary | ICD-10-CM | POA: Diagnosis not present

## 2017-06-18 DIAGNOSIS — M0579 Rheumatoid arthritis with rheumatoid factor of multiple sites without organ or systems involvement: Secondary | ICD-10-CM | POA: Diagnosis not present

## 2017-06-18 DIAGNOSIS — K51919 Ulcerative colitis, unspecified with unspecified complications: Secondary | ICD-10-CM

## 2017-06-18 DIAGNOSIS — M170 Bilateral primary osteoarthritis of knee: Secondary | ICD-10-CM

## 2017-06-18 DIAGNOSIS — M47816 Spondylosis without myelopathy or radiculopathy, lumbar region: Secondary | ICD-10-CM

## 2017-06-18 DIAGNOSIS — Z7952 Long term (current) use of systemic steroids: Secondary | ICD-10-CM

## 2017-06-18 DIAGNOSIS — Z79899 Other long term (current) drug therapy: Secondary | ICD-10-CM | POA: Diagnosis not present

## 2017-06-18 LAB — COMPLETE METABOLIC PANEL WITH GFR
AG RATIO: 1.5 (calc) (ref 1.0–2.5)
ALKALINE PHOSPHATASE (APISO): 59 U/L (ref 33–130)
ALT: 14 U/L (ref 6–29)
AST: 18 U/L (ref 10–35)
Albumin: 4.5 g/dL (ref 3.6–5.1)
BILIRUBIN TOTAL: 0.6 mg/dL (ref 0.2–1.2)
BUN/Creatinine Ratio: 15 (calc) (ref 6–22)
BUN: 15 mg/dL (ref 7–25)
CHLORIDE: 103 mmol/L (ref 98–110)
CO2: 30 mmol/L (ref 20–32)
Calcium: 10.1 mg/dL (ref 8.6–10.4)
Creat: 1.03 mg/dL — ABNORMAL HIGH (ref 0.50–0.99)
GFR, Est African American: 67 mL/min/{1.73_m2} (ref >=60)
GFR, Est Non African American: 57 mL/min/{1.73_m2} — ABNORMAL LOW (ref >=60)
GLUCOSE: 103 mg/dL — AB (ref 65–99)
Globulin: 3 g/dL (calc) (ref 1.9–3.7)
POTASSIUM: 4.5 mmol/L (ref 3.5–5.3)
Sodium: 139 mmol/L (ref 135–146)
Total Protein: 7.5 g/dL (ref 6.1–8.1)

## 2017-06-18 LAB — ANA: ANA: NEGATIVE

## 2017-06-18 LAB — CBC WITH DIFFERENTIAL/PLATELET
BASOS ABS: 34 {cells}/uL (ref 0–200)
BASOS PCT: 0.4 %
EOS PCT: 0.7 %
Eosinophils Absolute: 60 cells/uL (ref 15–500)
HCT: 38.2 % (ref 35.0–45.0)
HEMOGLOBIN: 12.7 g/dL (ref 11.7–15.5)
LYMPHS ABS: 1152 {cells}/uL (ref 850–3900)
MCH: 30.3 pg (ref 27.0–33.0)
MCHC: 33.2 g/dL (ref 32.0–36.0)
MCV: 91.2 fL (ref 80.0–100.0)
MPV: 10.8 fL (ref 7.5–12.5)
Monocytes Relative: 7.4 %
NEUTROS ABS: 6717 {cells}/uL (ref 1500–7800)
Neutrophils Relative %: 78.1 %
Platelets: 324 10*3/uL (ref 140–400)
RBC: 4.19 10*6/uL (ref 3.80–5.10)
RDW: 11.4 % (ref 11.0–15.0)
Total Lymphocyte: 13.4 %
WBC mixed population: 636 cells/uL (ref 200–950)
WBC: 8.6 10*3/uL (ref 3.8–10.8)

## 2017-06-18 LAB — PROTEIN ELECTROPHORESIS, SERUM, WITH REFLEX
ALBUMIN ELP: 4.3 g/dL (ref 3.8–4.8)
ALPHA 2: 0.8 g/dL (ref 0.5–0.9)
Alpha 1: 0.3 g/dL (ref 0.2–0.3)
BETA 2: 0.4 g/dL (ref 0.2–0.5)
Beta Globulin: 0.4 g/dL (ref 0.4–0.6)
Gamma Globulin: 1.2 g/dL (ref 0.8–1.7)
Total Protein: 7.4 g/dL (ref 6.1–8.1)

## 2017-06-18 LAB — HEPATITIS PANEL, ACUTE
HEP A IGM: NONREACTIVE
HEP C AB: NONREACTIVE
Hep B C IgM: NONREACTIVE
Hepatitis B Surface Ag: NONREACTIVE
SIGNAL TO CUT-OFF: 0.03 (ref <1.00)

## 2017-06-18 LAB — URINALYSIS, ROUTINE W REFLEX MICROSCOPIC
BILIRUBIN URINE: NEGATIVE
GLUCOSE, UA: NEGATIVE
HGB URINE DIPSTICK: NEGATIVE
Ketones, ur: NEGATIVE
Leukocytes, UA: NEGATIVE
Nitrite: NEGATIVE
PROTEIN: NEGATIVE
Specific Gravity, Urine: 1.01 (ref 1.001–1.03)
pH: 7.5 (ref 5.0–8.0)

## 2017-06-18 LAB — CK: CK TOTAL: 72 U/L (ref 29–143)

## 2017-06-18 LAB — HIV ANTIBODY (ROUTINE TESTING W REFLEX): HIV 1&2 Ab, 4th Generation: NONREACTIVE

## 2017-06-18 LAB — TSH: TSH: 0.87 m[IU]/L (ref 0.40–4.50)

## 2017-06-18 LAB — QUANTIFERON-TB GOLD PLUS

## 2017-06-18 LAB — CYCLIC CITRUL PEPTIDE ANTIBODY, IGG: Cyclic Citrullin Peptide Ab: 176 UNITS — ABNORMAL HIGH

## 2017-06-18 LAB — RHEUMATOID FACTOR

## 2017-06-18 LAB — SEDIMENTATION RATE: SED RATE: 14 mm/h (ref 0–30)

## 2017-06-18 LAB — IGG, IGA, IGM
IGM, SERUM: 121 mg/dL (ref 48–271)
IMMUNOGLOBULIN A: 238 mg/dL (ref 81–463)
IgG (Immunoglobin G), Serum: 1205 mg/dL (ref 694–1618)

## 2017-06-18 MED ORDER — LEFLUNOMIDE 10 MG PO TABS: 10.0000 mg | ORAL_TABLET | Freq: Every day | ORAL | 0 refills | Status: DC

## 2017-06-18 NOTE — Patient Instructions (Addendum)
Standing Labs We placed an order today for your standing lab work.    Please come back and get your standing labs in 2 weeks x 2 and then every 2 months CBC and CMP   We have open lab Monday through Friday from 8:30-11:30 AM and 1:30-4 PM at the office of Dr. Bo Merino.   The office is located at [address], [place], [place], [place] [address] No appointment is necessary.   Labs are drawn by [facility].  You may receive a bill from [facility] for your lab work. If you have any questions regarding directions or hours of operation,  please call [phone].       Leflunomide tablets What is this medicine? LEFLUNOMIDE (le FLOO na mide) is for rheumatoid arthritis. This medicine may be used for other purposes; ask your health care provider or pharmacist if you have questions. COMMON BRAND NAME(S): Arava What should I tell my health care provider before I take this medicine? They need to know if you have any of these conditions: -alcoholism -bone marrow problems -fever or infection -immune system problems -kidney disease -liver disease -an unusual or allergic reaction to leflunomide, teriflunomide, other medicines, lactose, foods, dyes, or preservatives -pregnant or trying to get pregnant -breast-feeding How should I use this medicine? Take this medicine by mouth with a full glass of water. Follow the directions on the prescription label. Take your medicine at regular intervals. Do not take your medicine more often than directed. Do not stop taking except on your doctor's advice. Talk to your pediatrician regarding the use of this medicine in children. Special care may be needed. Overdosage: If you think you have taken too much of this medicine contact a poison control center or emergency room at once. NOTE: This medicine is only for you. Do not share this medicine with others. What if I miss a dose? If you miss a dose, take it as soon as you can. If it is almost time for your  next dose, take only that dose. Do not take double or extra doses. What may interact with this medicine? Do not take this medicine with any of the following medications: -teriflunomide This medicine may also interact with the following medications: -charcoal -cholestyramine -methotrexate -NSAIDs, medicines for pain and inflammation, like ibuprofen or naproxen -phenytoin -rifampin -tolbutamide -vaccines -warfarin This list may not describe all possible interactions. Give your health care provider a list of all the medicines, herbs, non-prescription drugs, or dietary supplements you use. Also tell them if you smoke, drink alcohol, or use illegal drugs. Some items may interact with your medicine. What should I watch for while using this medicine? Visit your doctor or health care professional for regular checks on your progress. You will need frequent blood checks while you are receiving the medicine. If you get a cold or other infection while receiving this medicine, call your doctor or health care professional. Do not treat yourself. The medicine may increase your risk of getting an infection. If you are a woman who has the potential to become pregnant, discuss birth control options with your doctor or health care professional. Dennis Bast must not be pregnant, and you must be using a reliable form of birth control. The medicine may harm an unborn baby. Immediately call your doctor if you think you might be pregnant. Alcoholic drinks may increase possible damage to your liver. Do not drink alcohol while taking this medicine. What side effects may I notice from receiving this medicine? Side effects that  you should report to your doctor or health care professional as soon as possible: -allergic reactions like skin rash, itching or hives, swelling of the face, lips, or tongue -cough -difficulty breathing or shortness of breath -fever, chills or any other sign of infection -redness, blistering, peeling  or loosening of the skin, including inside the mouth -unusual bleeding or bruising -unusually weak or tired -vomiting -yellowing of eyes or skin Side effects that usually do not require medical attention (report to your doctor or health care professional if they continue or are bothersome): -diarrhea -hair loss -headache -nausea This list may not describe all possible side effects. Call your doctor for medical advice about side effects. You may report side effects to FDA at 1-800-FDA-1088. Where should I keep my medicine? Keep out of the reach of children. Store at room temperature between 15 and 30 degrees C (59 and 86 degrees F). Protect from moisture and light. Throw away any unused medicine after the expiration date. NOTE: This sheet is a summary. It may not cover all possible information. If you have questions about this medicine, talk to your doctor, pharmacist, or health care provider.  2018 Elsevier/Gold Standard (2013-04-29 94:17:40) Anticyclic-Citrullinated Peptide Antibody Test Why am I having this test? The anticyclic-citrullinated peptide antibody test may be done to:  Help your health care provider diagnose rheumatoid arthritis.  Determine the severity and progression of your rheumatoid arthritis.  This test may be done if you have unexplained joint inflammation and have previously tested negative for rheumatoid factor. It may also be done if you have been diagnosed with undifferentiated arthritis and your health care provider suspects rheumatoid arthritis. What kind of sample is taken? A blood sample is required for this test. It is usually collected by inserting a needle into a vein. How do I prepare for this test? There is no preparation required for this test. How are the results reported? Your test results will be reported as either positive or negative. It is your responsibility to obtain your test results. Ask the lab or department performing the test when and how  you will get your results. What do the results mean?  A positive blood test may mean that you have rheumatoid arthritis.  A negative blood test means that it is unlikely that you have rheumatoid arthritis. Talk with your health care provider to discuss your results, treatment options, and if necessary, the need for more tests. Talk with your health care provider if you have any questions about your results. Talk with your health care provider to discuss your results, treatment options, and if necessary, the need for more tests. Talk with your health care provider if you have any questions about your results. This information is not intended to replace advice given to you by your health care provider. Make sure you discuss any questions you have with your health care provider. Document Released: 05/23/2004 Document Revised: 01/03/2016 Document Reviewed: 09/24/2013 Elsevier Interactive Patient Education  2018 [place].  Rheumatoid Arthritis Rheumatoid arthritis (RA) is a long-term (chronic) disease that causes inflammation in your joints. RA may start slowly. It usually affects the small joints of the hands and feet. Usually, the same joints are affected on both sides of your body. Inflammation from RA can also affect other parts of your body, including your heart, eyes, or lungs. RA is an autoimmune disease. That means that your body's defense system (immune system) mistakenly attacks healthy body tissues. There is no cure for RA, but medicines can help  your symptoms and halt or slow down the progression of the disease. What are the causes? The exact cause of RA is not known. What increases the risk? This condition is more likely to develop in:  Women.  People who have a family history of RA or other autoimmune diseases.  What are the signs or symptoms? Symptoms of this condition vary from person to person. Symptoms usually start gradually. They are often worse in the morning. The first  symptom may be morning stiffness that lasts longer than 30 minutes. As RA progresses, symptoms may include:  Pain, stiffness, swelling, warmth, and tenderness in joints on both sides of your body.  Loss of energy.  Loss of appetite.  Weight loss.  Low-grade fever.  Dry eyes and dry mouth.  Firm lumps (rheumatoid nodules) that grow beneath your skin in areas such as your forearm bones near your elbows and on your hands.  Changes in the appearance of joints (deformity) and loss of joint function.  Symptoms of RA often come and go. Sometimes, symptoms get worse for a period of time. These are called flares. How is this diagnosed? This condition is diagnosed based on your symptoms, medical history, and physical exam. You may have X-rays or MRI to check for the type of joint changes that are caused by RA. You may also have blood tests to look for:  Proteins (antibodies) that your immune system may make if you have RA. They include rheumatoid factor (RF) and anti-CCP. ? When blood tests show these proteins, you are said to have "seropositive RA." ? When blood tests do not show these proteins, you may have "seronegative RA."  Inflammation in your blood.  A low number of red blood cells (anemia).  How is this treated? The goals of treatment are to relieve pain, reduce inflammation, and slow down or stop joint damage and disability. Treatment may include:  Lifestyle changes. It is important to rest, eat a healthy diet, and exercise.  Medicines. Your health care provider may adjust your medicines every 3 months until treatment goals are reached. Common medicines include: ? Pain relievers (analgesics). ? Corticosteroids and NSAIDs to reduce inflammation. ? Disease-modifying antirheumatic drugs (DMARDs) to try to slow the course of the disease. ? Biologic response modifiers to reduce inflammation and damage.  Physical therapy and occupational therapy.  Surgery, if you have severe  joint damage. Joint replacement or fusing of joints may be needed.  Your health care provider will work with you to identify the best treatment option for you based on assessment of the overall disease activity in your body. Follow these instructions at home:  Take over-the-counter and prescription medicines only as told by your health care provider.  Start an exercise program as told by your health care provider.  Rest when you are having a flare.  Return to your normal activities as told by your health care provider. Ask your health care provider what activities are safe for you.  Keep all follow-up visits as told by your health care provider. This is important. Where to find more information:  [employer]umat[employer]: www.rheumatology.[place]: www.arthritis.org Contact a health care provider if:  You have a flare-up of RA symptoms.  You have a fever.  You have side effects from your medicines. Get help right away if:  You have chest pain.  You have trouble breathing.  You quickly develop a hot, painful joint that is more severe than your usual joint aches. This information  is not intended to replace advice given to you by your health care provider. Make sure you discuss any questions you have with your health care provider. Document Released: 04/28/2000 Document Revised: 10/05/2015 Document Reviewed: 02/11/2015 Elsevier Interactive Patient Education  2018 [place].  Rheumatoid Arthritis Rheumatoid arthritis (RA) is a long-term (chronic) disease. RA causes inflammation in your joints. Your joints may feel painful, stiff, swollen, warm, or tender. RA may start slowly. Usually, it affects the small joints of the hands and feet. It can also affect other parts of the body, even the heart, eyes, or lungs. Symptoms of RA often come and go. Sometimes, symptoms get worse for a while. These are called flares. There is no cure for RA, but your doctor  will work with you to find the best treatment option for you. This will depend on how the disease is changing in your body. Follow these instructions at home:  Take over-the-counter and prescription medicines only as told by your doctor. Your doctor may change (adjust) your medicines every 3 months.  Start an exercise program as told by your doctor.  Rest when you have a flare.  Return to your normal activities as told by your doctor. Ask your doctor what activities are safe for you.  Keep all follow-up visits as told by your doctor. This is important. Contact a doctor if:  You have a flare.  You have a fever.  You have problems (side effects) because of your medicines. Get help right away if:  You have chest pain.  You have trouble breathing.  You have a hot, painful joint all of a sudden, and it is worse than your usual joint aches. This information is not intended to replace advice given to you by your health care provider. Make sure you discuss any questions you have with your health care provider. Document Released: 07/24/2011 Document Revised: 10/07/2015 Document Reviewed: 02/11/2015 Elsevier Interactive Patient Education  [employer].

## 2017-06-20 LAB — QUANTIFERON-TB GOLD PLUS
Mitogen-NIL: >10 IU/mL
NIL: 0.08 IU/mL
QuantiFERON-TB Gold Plus: NEGATIVE
TB1-NIL: <0 IU/mL
TB2-NIL: <0 IU/mL

## 2017-06-20 NOTE — Progress Notes (Signed)
TB gold is negative.

## 2017-06-26 ENCOUNTER — Telehealth: Payer: Self-pay | Admitting: Rheumatology

## 2017-06-26 DIAGNOSIS — M2550 Pain in unspecified joint: Secondary | ICD-10-CM

## 2017-06-26 NOTE — Telephone Encounter (Signed)
Patient call stating that she has questions regarding the [place] that was prescribed to her.

## 2017-06-26 NOTE — Telephone Encounter (Signed)
Patient advised she may have the CCP rechecked if she would like and order has been placed. Patient reminded of lab hours.

## 2017-06-26 NOTE — Telephone Encounter (Signed)
Patient states when she did research about the [place] and it made her nervous about taking it. Patient states she would like to know if we can rerun the CCP to make sure it was not a false positive before starting such a heavy medication. Please advise.

## 2017-06-26 NOTE — Telephone Encounter (Signed)
She previously had a strong positive CCP. She can return to recheck her CCP if she would like.

## 2017-07-02 ENCOUNTER — Other Ambulatory Visit: Payer: Self-pay

## 2017-07-02 DIAGNOSIS — M2550 Pain in unspecified joint: Secondary | ICD-10-CM

## 2017-07-03 LAB — CYCLIC CITRUL PEPTIDE ANTIBODY, IGG: CYCLIC CITRULLIN PEPTIDE AB: 120 U — AB

## 2017-07-03 NOTE — Progress Notes (Signed)
CCP continues to be elevated.

## 2017-07-04 ENCOUNTER — Other Ambulatory Visit: Payer: Self-pay | Admitting: *Deleted

## 2017-07-04 MED ORDER — PREDNISONE 5 MG PO TABS: 10.0000 mg | ORAL_TABLET | Freq: Every day | ORAL | 1 refills | Status: DC

## 2017-07-04 NOTE — Progress Notes (Signed)
Patient needs refill on Prednisone.  Last Visit: 06/18/17 Next Visit: 08/29/17  Okay to refill per Dr. Estanislado Pandy

## 2017-07-13 ENCOUNTER — Ambulatory Visit: Payer: Self-pay | Admitting: Rheumatology

## 2017-07-17 ENCOUNTER — Other Ambulatory Visit: Payer: Self-pay | Admitting: Physician Assistant

## 2017-07-18 NOTE — Telephone Encounter (Signed)
Last Visit: 06/18/17 Next Visit: 08/29/17 Labs: 06/11/17 cbc/cmp wnl  Okay to refill per Dr.Deveshwar

## 2017-07-25 ENCOUNTER — Other Ambulatory Visit: Payer: Self-pay

## 2017-07-25 DIAGNOSIS — Z79899 Other long term (current) drug therapy: Secondary | ICD-10-CM

## 2017-07-26 LAB — CBC WITH DIFFERENTIAL/PLATELET
BASOS ABS: 17 {cells}/uL (ref 0–200)
BASOS PCT: 0.2 %
EOS ABS: 59 {cells}/uL (ref 15–500)
Eosinophils Relative: 0.7 %
HCT: 40.4 % (ref 35.0–45.0)
HEMOGLOBIN: 13.5 g/dL (ref 11.7–15.5)
Lymphs Abs: 1151 cells/uL (ref 850–3900)
MCH: 30.8 pg (ref 27.0–33.0)
MCHC: 33.4 g/dL (ref 32.0–36.0)
MCV: 92 fL (ref 80.0–100.0)
MPV: 10.7 fL (ref 7.5–12.5)
Monocytes Relative: 8.1 %
Neutro Abs: 6493 cells/uL (ref 1500–7800)
Neutrophils Relative %: 77.3 %
PLATELETS: 318 10*3/uL (ref 140–400)
RBC: 4.39 10*6/uL (ref 3.80–5.10)
RDW: 11.5 % (ref 11.0–15.0)
TOTAL LYMPHOCYTE: 13.7 %
WBC: 8.4 10*3/uL (ref 3.8–10.8)
WBCMIX: 680 {cells}/uL (ref 200–950)

## 2017-07-26 LAB — COMPLETE METABOLIC PANEL WITH GFR
AG RATIO: 2 (calc) (ref 1.0–2.5)
ALT: 16 U/L (ref 6–29)
AST: 17 U/L (ref 10–35)
Albumin: 4.5 g/dL (ref 3.6–5.1)
Alkaline phosphatase (APISO): 53 U/L (ref 33–130)
BILIRUBIN TOTAL: 0.6 mg/dL (ref 0.2–1.2)
BUN/Creatinine Ratio: 19 (calc) (ref 6–22)
BUN: 21 mg/dL (ref 7–25)
CHLORIDE: 103 mmol/L (ref 98–110)
CO2: 29 mmol/L (ref 20–32)
Calcium: 10.7 mg/dL — ABNORMAL HIGH (ref 8.6–10.4)
Creat: 1.08 mg/dL — ABNORMAL HIGH (ref 0.50–0.99)
GFR, EST AFRICAN AMERICAN: 63 mL/min/{1.73_m2} (ref >=60)
GFR, Est Non African American: 54 mL/min/{1.73_m2} — ABNORMAL LOW (ref >=60)
Globulin: 2.3 g/dL (calc) (ref 1.9–3.7)
Glucose, Bld: 101 mg/dL — ABNORMAL HIGH (ref 65–99)
POTASSIUM: 4.7 mmol/L (ref 3.5–5.3)
Sodium: 138 mmol/L (ref 135–146)
TOTAL PROTEIN: 6.8 g/dL (ref 6.1–8.1)

## 2017-07-26 NOTE — Progress Notes (Signed)
Creatinine elevated. We will continue to monitor.  We do not need to reduce her dose of Arava.   Calcium is elevated.  Please advise her to discontinue her calcium supplement at this time.

## 2017-08-12 ENCOUNTER — Other Ambulatory Visit: Payer: Self-pay | Admitting: Rheumatology

## 2017-08-13 ENCOUNTER — Other Ambulatory Visit: Payer: Self-pay

## 2017-08-13 DIAGNOSIS — Z79899 Other long term (current) drug therapy: Secondary | ICD-10-CM

## 2017-08-13 NOTE — Telephone Encounter (Signed)
Last Visit: 06/18/17 Next Visit: 08/29/17 Labs: 06/11/17 cbc/cmp wnl  Okay to refill per Dr.Deveshwar

## 2017-08-14 LAB — CBC WITH DIFFERENTIAL/PLATELET
BASOS ABS: 34 {cells}/uL (ref 0–200)
Basophils Relative: 0.3 %
EOS PCT: 0.4 %
Eosinophils Absolute: 45 cells/uL (ref 15–500)
HEMATOCRIT: 41.1 % (ref 35.0–45.0)
Hemoglobin: 13.9 g/dL (ref 11.7–15.5)
LYMPHS ABS: 904 {cells}/uL (ref 850–3900)
MCH: 30.8 pg (ref 27.0–33.0)
MCHC: 33.8 g/dL (ref 32.0–36.0)
MCV: 90.9 fL (ref 80.0–100.0)
MONOS PCT: 6.3 %
MPV: 11.2 fL (ref 7.5–12.5)
NEUTROS PCT: 85 %
Neutro Abs: 9605 cells/uL — ABNORMAL HIGH (ref 1500–7800)
PLATELETS: 287 10*3/uL (ref 140–400)
RBC: 4.52 10*6/uL (ref 3.80–5.10)
RDW: 11.6 % (ref 11.0–15.0)
TOTAL LYMPHOCYTE: 8 %
WBC mixed population: 712 cells/uL (ref 200–950)
WBC: 11.3 10*3/uL — AB (ref 3.8–10.8)

## 2017-08-14 LAB — COMPLETE METABOLIC PANEL WITH GFR
AG Ratio: 1.8 (calc) (ref 1.0–2.5)
ALT: 19 U/L (ref 6–29)
AST: 20 U/L (ref 10–35)
Albumin: 4.5 g/dL (ref 3.6–5.1)
Alkaline phosphatase (APISO): 59 U/L (ref 33–130)
BUN/Creatinine Ratio: 15 (calc) (ref 6–22)
BUN: 15 mg/dL (ref 7–25)
CALCIUM: 10.3 mg/dL (ref 8.6–10.4)
CO2: 30 mmol/L (ref 20–32)
Chloride: 105 mmol/L (ref 98–110)
Creat: 1.01 mg/dL — ABNORMAL HIGH (ref 0.50–0.99)
GFR, EST NON AFRICAN AMERICAN: 59 mL/min/{1.73_m2} — AB (ref >=60)
GFR, Est African American: 68 mL/min/{1.73_m2} (ref >=60)
Globulin: 2.5 g/dL (calc) (ref 1.9–3.7)
Glucose, Bld: 101 mg/dL — ABNORMAL HIGH (ref 65–99)
Potassium: 4.3 mmol/L (ref 3.5–5.3)
Sodium: 141 mmol/L (ref 135–146)
Total Bilirubin: 0.7 mg/dL (ref 0.2–1.2)
Total Protein: 7 g/dL (ref 6.1–8.1)

## 2017-08-14 NOTE — Progress Notes (Signed)
Labs are stable.

## 2017-08-16 NOTE — Progress Notes (Signed)
Office Visit Note  Patient: Terri Hudson             Date of Birth: 17-Nov-1950           MRN: [id number]             PCP: Maurice Small, MD Referring: Maurice Small, MD Visit Date: 08/29/2017 Occupation: _0 @    Subjective:  Medication Management   History of Present Illness: Terri Hudson is a 67 y.o. female with history of most likely polymyalgia rheumatica.  She was a started on prednisone 20 mg a day while she was visiting [place].  She has tapered her prednisone from 20-10 mg a day.  She denies any relapse of her disease.  She is able to raise her arms without difficulty.  She denies any joint swelling.  She was started on [place] as she was concerned about the side effects of methotrexate.  She has been tolerating Arava 10 mg p.o. daily quite well.  Activities of Daily Living:  Patient reports morning stiffness for 0 minute.   Patient Denies nocturnal pain.  Difficulty dressing/grooming: Denies Difficulty climbing stairs: Denies Difficulty getting out of chair: Denies Difficulty using hands for taps, buttons, cutlery, and/or writing: Denies   Review of Systems  Constitutional: Negative for fatigue, night sweats, weight gain and weight loss.  HENT: Negative for mouth sores, trouble swallowing, trouble swallowing, mouth dryness and nose dryness.   Eyes: Negative for pain, redness, visual disturbance and dryness.  Respiratory: Negative for cough, shortness of breath and difficulty breathing.   Cardiovascular: Negative for chest pain, palpitations, hypertension, irregular heartbeat and swelling in legs/feet.  Gastrointestinal: Negative for blood in stool, constipation and diarrhea.  Endocrine: Negative for increased urination.  Genitourinary: Negative for vaginal dryness.  Musculoskeletal: Negative for arthralgias, joint pain, joint swelling, myalgias, muscle weakness, morning stiffness, muscle tenderness and myalgias.  Skin: Negative for color change, rash, hair  loss, skin tightness, ulcers and sensitivity to sunlight.  Allergic/Immunologic: Negative for susceptible to infections.  Neurological: Negative for dizziness, memory loss, night sweats and weakness.  Hematological: Negative for swollen glands.  Psychiatric/Behavioral: Negative for depressed mood and sleep disturbance. The patient is not nervous/anxious.     PMFS History:  Patient Active Problem List   Diagnosis Date Noted  . Polymyalgia rheumatica ([place]) 06/07/2017    Past Medical History:  Diagnosis Date  . Migraine   . Ulcerative colitis ([place])     Family History  Problem Relation Age of Onset  . Heart disease Mother   . Cancer Father        throat   . Migraines Daughter   . Bowel Disease Son    Past Surgical History:  Procedure Laterality Date  . KNEE SURGERY Right    Social History   Social History Narrative  . Not on file     Objective: Vital Signs: BP (!) 97/58 (BP Location: Left Arm, Patient Position: Sitting, Cuff Size: Small)   Pulse (!) 58   Resp 13   Ht _1  (1.727 m)   Wt 149 lb (67.6 kg)   BMI 22.66 kg/m    Physical Exam  Constitutional: She is oriented to person, place, and time. She appears well-developed and well-nourished.  HENT:  Head: Normocephalic and atraumatic.  Eyes: Conjunctivae and EOM are normal.  Neck: Normal range of motion.  Cardiovascular: Normal rate, regular rhythm, normal heart sounds and intact distal pulses.  Pulmonary/Chest: Effort normal and breath sounds normal.  Abdominal: Soft. Bowel  sounds are normal.  Lymphadenopathy:    She has no cervical adenopathy.  Neurological: She is alert and oriented to person, place, and time.  Skin: Skin is warm and dry. Capillary refill takes less than 2 seconds.  Psychiatric: She has a normal mood and affect. Her behavior is normal.  Nursing note and vitals reviewed.    Musculoskeletal Exam: Thoracic lumbar spine good range of motion.  Shoulder joints elbow joints wrist joint MCPs  PIPs DIPs were in good range of motion with no synovitis.  Hip joints knee joints ankles MTPs PIPs were in good range of motion with no synovitis.  CDAI Exam: No CDAI exam completed.    Investigation: No additional findings. CBC Latest Ref Rng & Units 08/13/2017 07/25/2017 06/11/2017  WBC 3.8 - 10.8 Thousand/uL 11.3(H) 8.4 8.6  Hemoglobin 11.7 - 15.5 g/dL 13.9 13.5 12.7  Hematocrit 35.0 - 45.0 % 41.1 40.4 38.2  Platelets 140 - 400 Thousand/uL 287 318 324   CMP Latest Ref Rng & Units 08/13/2017 07/25/2017 06/11/2017  Glucose 65 - 99 mg/dL 101(H) 101(H) 103(H)  BUN 7 - 25 mg/dL _0 Creatinine 0.50 - 0.99 mg/dL 1.01(H) 1.08(H) 1.03(H)  Sodium 135 - 146 mmol/L 141 138 139  Potassium 3.5 - 5.3 mmol/L 4.3 4.7 4.5  Chloride 98 - 110 mmol/L 105 103 103  CO2 20 - 32 mmol/L _1 Calcium 8.6 - 10.4 mg/dL 10.3 10.7(H) 10.1  Total Protein 6.1 - 8.1 g/dL 7.0 6.8 7.5  Total Bilirubin 0.2 - 1.2 mg/dL 0.7 0.6 0.6  Alkaline Phos 38 - 126 U/L - - -  AST 10 - 35 U/L _2 ALT 6 - 29 U/L _3 Imaging: No results found.  Speciality Comments: No specialty comments available.    Procedures:  No procedures performed Allergies: Patient has no known allergies.   Assessment / Plan:     Visit Diagnoses: Polymyalgia rheumatica ([place]) - ESR on 06/01/17 was WNL.  She is on Prednisone 10 mg po daily.  We had detailed discussion regarding polymyalgia rheumatica.  I have advised to taper her prednisone by 1 mg every month until she reaches 5 mg p.o. daily.  She did not want to take methotrexate for that reason she has been on [place].  High risk medication use - Arava 10 mg po qd.  Her labs have been stable.  We will continue to monitor her labs every 3 months.  She will continue on current dose of Arava.  Abnormal laboratory test - anti-CCP+, RF-, ANA-.  She had no clinical features of rheumatoid arthritis at this time.  Anti-CCP can be positive with ulcerative colitis as well.  On prednisone  therapy - 10 mg daily.  Patient should be getting bone density in the near future.  Need for resistive exercises were discussed.  Primary osteoarthritis of both knees - She has bilateral knee crepitus.  Other ulcerative colitis with other complication (HCC) - Mesalazine 500 mg 1 tablet po BID daily.  No recent flares.  She is currently not followed by a GI specialist.   Spondylosis of lumbar spine: She is not having much discomfort currently.   Orders: No orders of the defined types were placed in this encounter.  No orders of the defined types were placed in this encounter.   Face-to-face time spent with patient was 30 minutes.> 50% of time was spent in counseling and coordination of care.  Follow-Up Instructions: Return in  about 4 months (around 12/29/2017) for Polymyalgia rheumatica.   Bo Merino, MD  Note - This record has been created using Editor, commissioning.  Chart creation errors have been sought, but may not always  have been located. Such creation errors do not reflect on  the standard of medical care.

## 2017-08-26 ENCOUNTER — Other Ambulatory Visit: Payer: Self-pay | Admitting: Rheumatology

## 2017-08-27 NOTE — Telephone Encounter (Signed)
Last Visit: 06/18/17 Next Visit: 08/29/17  Okay to refill per Dr.Deveshwar

## 2017-08-29 ENCOUNTER — Encounter: Payer: Self-pay | Admitting: Rheumatology

## 2017-08-29 ENCOUNTER — Ambulatory Visit: Payer: BLUE CROSS/BLUE SHIELD | Admitting: Rheumatology

## 2017-08-29 VITALS — BP 97/58 | HR 58 | Resp 13 | Ht 68.0 in | Wt 149.0 lb

## 2017-08-29 DIAGNOSIS — Z7952 Long term (current) use of systemic steroids: Secondary | ICD-10-CM | POA: Diagnosis not present

## 2017-08-29 DIAGNOSIS — R899 Unspecified abnormal finding in specimens from other organs, systems and tissues: Secondary | ICD-10-CM

## 2017-08-29 DIAGNOSIS — M47816 Spondylosis without myelopathy or radiculopathy, lumbar region: Secondary | ICD-10-CM | POA: Diagnosis not present

## 2017-08-29 DIAGNOSIS — Z79899 Other long term (current) drug therapy: Secondary | ICD-10-CM

## 2017-08-29 DIAGNOSIS — M170 Bilateral primary osteoarthritis of knee: Secondary | ICD-10-CM | POA: Diagnosis not present

## 2017-08-29 DIAGNOSIS — M353 Polymyalgia rheumatica: Secondary | ICD-10-CM | POA: Diagnosis not present

## 2017-08-29 DIAGNOSIS — K51818 Other ulcerative colitis with other complication: Secondary | ICD-10-CM

## 2017-08-29 NOTE — Patient Instructions (Addendum)
Please decrease prednisone by 1 mg every month until you reach 5 mg tablet by mouth daily.  Standing Labs We placed an order today for your standing lab work.    Please come back and get your standing labs in July and every 3 months  We have open lab Monday through Friday from 8:30-11:30 AM and 1:30-4:00 PM  at the office of Dr. Bo Merino.   You may experience shorter wait times on Monday and Friday afternoons. The office is located at [address], [place], [place], [place] [address] No appointment is necessary.   Labs are drawn by [facility].  You may receive a bill from [facility] for your lab work. If you have any questions regarding directions or hours of operation,  please call [phone].

## 2017-09-08 ENCOUNTER — Other Ambulatory Visit: Payer: Self-pay | Admitting: Rheumatology

## 2017-09-10 NOTE — Telephone Encounter (Signed)
Last Visit: 08/29/17 Next Visit: 11/29/17 Labs: 08/13/17 stable  Okay to refill per Dr. Estanislado Pandy

## 2017-11-03 ENCOUNTER — Other Ambulatory Visit: Payer: Self-pay | Admitting: Rheumatology

## 2017-11-05 NOTE — Telephone Encounter (Signed)
Last Visit: 08/29/17 Next Visit: 11/29/17 Labs: 08/13/17 stable  Okay to refill per Dr. Estanislado Pandy

## 2017-11-07 ENCOUNTER — Other Ambulatory Visit: Payer: Self-pay | Admitting: Rheumatology

## 2017-11-07 NOTE — Telephone Encounter (Signed)
Last Visit: 08/29/17 Next Visit: 11/29/17  Okay to refill per Dr. Estanislado Pandy

## 2017-11-08 NOTE — Progress Notes (Signed)
Office Visit Note  Patient: Terri Hudson             Date of Birth: 1950/11/12           MRN: [id number]             PCP: Maurice Small, MD Referring: Maurice Small, MD Visit Date: 11/20/2017 Occupation: @GUAROCC @    Subjective:  Medication monitoring   History of Present Illness: Zanae Kuehnle is a 67 y.o. female with history of polymyalgia rheumatica and osteoarthritis.  Patient is on Arava 10 mg by mouth daily and prednisone 7 mg by mouth daily.  She denies any joint pain or joint swelling.  She denies any knee pain or knee swelling.  She denies any joint stiffness.  She denies any muscle weakness or muscle tenderness at this time.  She denies any difficulty getting up from a chair or raising her arms above her head.  She states she has noticed some incresed fatigue as she has been tapering prednisone.  She understands she will be tapering by 1 mg every month.  She reports she has been having some discomfort in coccyx region, especially when laying flat at night.  She describes it as a burning sensation.  She denies any radiation or pain or numbness.  She denies any recent flares of ulcerative colitis.      Activities of Daily Living:  Patient reports morning stiffness for 0  minutes.   Patient Denies nocturnal pain.  Difficulty dressing/grooming: Denies Difficulty climbing stairs: Denies Difficulty getting out of chair: Denies Difficulty using hands for taps, buttons, cutlery, and/or writing: Denies   Review of Systems  Constitutional: Positive for fatigue.  HENT: Negative for mouth sores, trouble swallowing, trouble swallowing, mouth dryness and nose dryness.   Eyes: Negative for pain, visual disturbance and dryness.  Respiratory: Negative for cough, hemoptysis, shortness of breath and difficulty breathing.   Cardiovascular: Negative for chest pain, palpitations, hypertension and swelling in legs/feet.  Gastrointestinal: Negative for blood in stool, constipation and  diarrhea.  Endocrine: Negative for increased urination.  Genitourinary: Negative for painful urination.  Musculoskeletal: Negative for arthralgias, joint pain, joint swelling, myalgias, muscle weakness, morning stiffness, muscle tenderness and myalgias.  Skin: Positive for color change (in bilateral feet). Negative for pallor, rash, hair loss, nodules/bumps, skin tightness, ulcers and sensitivity to sunlight.  Allergic/Immunologic: Negative for susceptible to infections.  Neurological: Negative for dizziness, numbness, headaches and weakness.  Hematological: Negative for swollen glands.  Psychiatric/Behavioral: Negative for depressed mood and sleep disturbance. The patient is not nervous/anxious.     PMFS History:  Patient Active Problem List   Diagnosis Date Noted  . Polymyalgia rheumatica ([place]) 06/07/2017    Past Medical History:  Diagnosis Date  . Migraine   . Ulcerative colitis ([place])     Family History  Problem Relation Age of Onset  . Heart disease Mother   . Cancer Father        throat   . Osteoporosis Father   . Migraines Daughter   . Bowel Disease Son    Past Surgical History:  Procedure Laterality Date  . KNEE SURGERY Right    Social History   Social History Narrative  . Not on file     Objective: Vital Signs: BP 140/85 (BP Location: Left Arm, Patient Position: Sitting, Cuff Size: Normal)   Pulse (!) 50   Resp 14   Ht 5' 8"  (1.727 m)   Wt 149 lb (67.6 kg)   BMI  22.66 kg/m    Physical Exam  Constitutional: She is oriented to person, place, and time. She appears well-developed and well-nourished.  HENT:  Head: Normocephalic and atraumatic.  Eyes: Conjunctivae and EOM are normal.  Neck: Normal range of motion.  Cardiovascular: Normal rate, regular rhythm, normal heart sounds and intact distal pulses.  Pulmonary/Chest: Effort normal and breath sounds normal.  Abdominal: Soft. Bowel sounds are normal.  Lymphadenopathy:    She has no cervical adenopathy.   Neurological: She is alert and oriented to person, place, and time.  Skin: Skin is warm and dry. Capillary refill takes less than 2 seconds.  Psychiatric: She has a normal mood and affect. Her behavior is normal.  Nursing note and vitals reviewed.    Musculoskeletal Exam: C-spine, thoracic spine, and lumbar spine good ROM.  No midline spinal tenderness.  No SI joint tenderness.  Full strength of upper extremities.  Shoulder joints, elbow joints, wrist joints, MCPs, PIPs, and DIPs good ROM with no synovitis.PIP and DIP synovial thickening consistent with osteoarthritis. Hip joints, knee joints, ankle joints, MTPs, PIPs, and DIPs good ROM with no synovitis.  No warmth or effusion of knee joints.  1st MTP, PIP, and DIP synovial thickening consistent with osteoarthritis. Full strength of lower extremities.  No tenderness of trochanteric bursa bilaterally.    CDAI Exam: No CDAI exam completed.    Investigation: No additional findings. CBC Latest Ref Rng & Units 11/16/2017 08/13/2017 07/25/2017  WBC 3.8 - 10.8 Thousand/uL 7.5 11.3(H) 8.4  Hemoglobin 11.7 - 15.5 g/dL 12.9 13.9 13.5  Hematocrit 35.0 - 45.0 % 39.5 41.1 40.4  Platelets 140 - 400 Thousand/uL 283 287 318   CMP Latest Ref Rng & Units 11/16/2017 08/13/2017 07/25/2017  Glucose 65 - 99 mg/dL 96 101(H) 101(H)  BUN 7 - 25 mg/dL 16 15 21   Creatinine 0.50 - 0.99 mg/dL 0.95 1.01(H) 1.08(H)  Sodium 135 - 146 mmol/L 139 141 138  Potassium 3.5 - 5.3 mmol/L 4.1 4.3 4.7  Chloride 98 - 110 mmol/L 104 105 103  CO2 20 - 32 mmol/L 28 30 29   Calcium 8.6 - 10.4 mg/dL 9.9 10.3 10.7(H)  Total Protein 6.1 - 8.1 g/dL 6.4 7.0 6.8  Total Bilirubin 0.2 - 1.2 mg/dL 1.0 0.7 0.6  Alkaline Phos 38 - 126 U/L - - -  AST 10 - 35 U/L 20 20 17   ALT 6 - 29 U/L 23 19 16     Imaging: No results found.  Speciality Comments: No specialty comments available.    Procedures:  No procedures performed Allergies: Patient has no known allergies.   Assessment / Plan:       Visit Diagnoses: Polymyalgia rheumatica ([place]) -She has no muscle weakness and muscle aches at this time. She has full strength of upper and lower extremities.  She has no difficulty getting up from a chair or with shoulder ROM.   She has no joint pain or joint swelling at this time. She has clinically been doing well on Arava 10 mg by mouth daily and Prednisone 7 mg po daily.  She is in agreement to continue to taper prednisone by 1 mg every month.  She does not need any refills at this time. She was advised to notify if she develops new or worsening symptoms while tapering prednisone by 1 mg every month.   High risk medication use - Arava 10 mg po qd. CBC and CMP were WNL on 11/16/17.  She was given a print out of her most  recent lab work.  She will return in October and every 3 months for lab work to monitor for drug toxicity.  CBC and CMP standing orders are in place.    Abnormal laboratory test - anti-CCP+, RF-, ANA-  On prednisone therapy: She is currently on Prednisone 7 mg by mouth daily.  She will continue to taper prednisone by 1 mg every month.  She does not need any refills at this time.   Primary osteoarthritis of both knees: No warmth or effusion of knee joints.  Good ROM on exam with no discomfort.    Spondylosis of lumbar spine: No midline spinal tenderness.  Good ROM on exam.  She has had some discomfort in the coccygeal region likely due to how she has been sitting.  She has been experiencing occasional discomfort when laying flat on her back at night.  Other ulcerative colitis with other complication [facility]): She has not had any recent flares. She has no abdominal pain at this time.    Orders: No orders of the defined types were placed in this encounter.  No orders of the defined types were placed in this encounter.   Face-to-face time spent with patient was 30 minutes. Greater than 50% of time was spent in counseling and coordination of care.  Follow-Up Instructions: Return in  about 5 months (around 04/22/2018) for Polymyalgia Rheumatica, Osteoarthritis.   Ofilia Neas, PA-C  I examined and evaluated the patient with Hazel Sams PA.  Patient had no muscle weakness or joint pain or joint inflammation on examination today.  She will continue to taper her prednisone by 1 mg every month.  We will monitor her labs that she is on [place].  She is been also advised to get her bone density.  She will resume Fosamax.  The plan of care was discussed as noted above.  Bo Merino, MD Note - This record has been created using Editor, commissioning.  Chart creation errors have been sought, but may not always  have been located. Such creation errors do not reflect on  the standard of medical care.

## 2017-11-16 ENCOUNTER — Other Ambulatory Visit: Payer: Self-pay

## 2017-11-16 DIAGNOSIS — Z79899 Other long term (current) drug therapy: Secondary | ICD-10-CM

## 2017-11-16 LAB — COMPLETE METABOLIC PANEL WITH GFR
AG Ratio: 2.2 (calc) (ref 1.0–2.5)
ALBUMIN MSPROF: 4.4 g/dL (ref 3.6–5.1)
ALKALINE PHOSPHATASE (APISO): 51 U/L (ref 33–130)
ALT: 23 U/L (ref 6–29)
AST: 20 U/L (ref 10–35)
BILIRUBIN TOTAL: 1 mg/dL (ref 0.2–1.2)
BUN: 16 mg/dL (ref 7–25)
CO2: 28 mmol/L (ref 20–32)
CREATININE: 0.95 mg/dL (ref 0.50–0.99)
Calcium: 9.9 mg/dL (ref 8.6–10.4)
Chloride: 104 mmol/L (ref 98–110)
GFR, Est African American: 73 mL/min/{1.73_m2} (ref >=60)
GFR, Est Non African American: 63 mL/min/{1.73_m2} (ref >=60)
Globulin: 2 g/dL (calc) (ref 1.9–3.7)
Glucose, Bld: 96 mg/dL (ref 65–99)
Potassium: 4.1 mmol/L (ref 3.5–5.3)
Sodium: 139 mmol/L (ref 135–146)
Total Protein: 6.4 g/dL (ref 6.1–8.1)

## 2017-11-16 LAB — CBC WITH DIFFERENTIAL/PLATELET
BASOS PCT: 0.4 %
Basophils Absolute: 30 cells/uL (ref 0–200)
EOS ABS: 98 {cells}/uL (ref 15–500)
Eosinophils Relative: 1.3 %
HCT: 39.5 % (ref 35.0–45.0)
HEMOGLOBIN: 12.9 g/dL (ref 11.7–15.5)
Lymphs Abs: 1403 cells/uL (ref 850–3900)
MCH: 30.5 pg (ref 27.0–33.0)
MCHC: 32.7 g/dL (ref 32.0–36.0)
MCV: 93.4 fL (ref 80.0–100.0)
MONOS PCT: 10.4 %
MPV: 11.2 fL (ref 7.5–12.5)
NEUTROS ABS: 5190 {cells}/uL (ref 1500–7800)
Neutrophils Relative %: 69.2 %
Platelets: 283 10*3/uL (ref 140–400)
RBC: 4.23 10*6/uL (ref 3.80–5.10)
RDW: 11.6 % (ref 11.0–15.0)
Total Lymphocyte: 18.7 %
WBC mixed population: 780 cells/uL (ref 200–950)
WBC: 7.5 10*3/uL (ref 3.8–10.8)

## 2017-11-19 NOTE — Progress Notes (Signed)
Labs are WNL.

## 2017-11-20 ENCOUNTER — Encounter: Payer: Self-pay | Admitting: Rheumatology

## 2017-11-20 ENCOUNTER — Ambulatory Visit: Payer: BLUE CROSS/BLUE SHIELD | Admitting: Rheumatology

## 2017-11-20 VITALS — BP 140/85 | HR 50 | Resp 14 | Ht 68.0 in | Wt 149.0 lb

## 2017-11-20 DIAGNOSIS — R899 Unspecified abnormal finding in specimens from other organs, systems and tissues: Secondary | ICD-10-CM | POA: Diagnosis not present

## 2017-11-20 DIAGNOSIS — M353 Polymyalgia rheumatica: Secondary | ICD-10-CM | POA: Diagnosis not present

## 2017-11-20 DIAGNOSIS — Z79899 Other long term (current) drug therapy: Secondary | ICD-10-CM | POA: Diagnosis not present

## 2017-11-20 DIAGNOSIS — Z7952 Long term (current) use of systemic steroids: Secondary | ICD-10-CM | POA: Diagnosis not present

## 2017-11-20 DIAGNOSIS — K51818 Other ulcerative colitis with other complication: Secondary | ICD-10-CM

## 2017-11-20 DIAGNOSIS — M170 Bilateral primary osteoarthritis of knee: Secondary | ICD-10-CM | POA: Diagnosis not present

## 2017-11-20 DIAGNOSIS — K5190 Ulcerative colitis, unspecified, without complications: Secondary | ICD-10-CM | POA: Insufficient documentation

## 2017-11-20 DIAGNOSIS — M47816 Spondylosis without myelopathy or radiculopathy, lumbar region: Secondary | ICD-10-CM | POA: Diagnosis not present

## 2017-11-20 NOTE — Patient Instructions (Addendum)
Standing Labs We placed an order today for your standing lab work.    Please come back and get your standing labs in October and every 3 months   We have open lab Monday through Friday from 8:30-11:30 AM and 1:30-4:00 PM  at the office of Dr. Bo Merino.   You may experience shorter wait times on Monday and Friday afternoons. The office is located at [address] [place], [place], [place] [address] No appointment is necessary.   Labs are drawn by [facility].  You may receive a bill from [facility] for your lab work. If you have any questions regarding directions or hours of operation,  please call [phone].     Taper prednisone by 1 mg every month

## 2017-11-29 ENCOUNTER — Ambulatory Visit: Payer: BLUE CROSS/BLUE SHIELD | Admitting: Rheumatology

## 2017-12-04 ENCOUNTER — Other Ambulatory Visit: Payer: Self-pay | Admitting: Rheumatology

## 2017-12-04 NOTE — Telephone Encounter (Signed)
Last Visit: 11/20/17 Next Visit: 04/23/18 Labs: 11/16/17 WNL  Okay to refill per Dr. Estanislado Pandy

## 2018-01-24 ENCOUNTER — Other Ambulatory Visit: Payer: Self-pay | Admitting: Rheumatology

## 2018-01-24 NOTE — Telephone Encounter (Signed)
Last Visit: 11/20/17 Next Visit: 04/23/18  Okay to refill per Dr. Estanislado Pandy

## 2018-01-24 NOTE — Telephone Encounter (Signed)
Attempted to contact patient and left message for patient to call the office. Need to verify dose.

## 2018-02-04 ENCOUNTER — Telehealth: Payer: Self-pay | Admitting: Rheumatology

## 2018-02-04 MED ORDER — PREDNISONE 1 MG PO TABS: 4.0000 mg | ORAL_TABLET | Freq: Every day | ORAL | 0 refills | Status: DC

## 2018-02-04 NOTE — Telephone Encounter (Signed)
Patient [place] was [place]. Patient states is currently on 4 mg of prednisone and will taper to 3 mg on March 01, 2018.

## 2018-02-04 NOTE — Telephone Encounter (Signed)
Patient called stating she was returning a call to our office regarding her prescription of Prednisone.  Patient requested a return call.

## 2018-02-06 ENCOUNTER — Other Ambulatory Visit: Payer: Self-pay | Admitting: Rheumatology

## 2018-02-06 NOTE — Telephone Encounter (Signed)
Last Visit: 11/20/17 Next Visit: 04/23/18 Labs: 11/16/17 WNL  Okay to refill per Dr. Estanislado Pandy

## 2018-02-12 DIAGNOSIS — M810 Age-related osteoporosis without current pathological fracture: Secondary | ICD-10-CM

## 2018-02-12 HISTORY — DX: Age-related osteoporosis without current pathological fracture: M81.0

## 2018-03-04 ENCOUNTER — Other Ambulatory Visit: Payer: Self-pay | Admitting: Rheumatology

## 2018-03-04 NOTE — Telephone Encounter (Signed)
Last Visit: 11/20/17 Next Visit: 04/23/18 Labs: 11/16/17 WNL  Patient advised she is due for labs and will update this week.   Okay to refill 30 day supply per Dr. Estanislado Pandy

## 2018-03-06 ENCOUNTER — Other Ambulatory Visit: Payer: Self-pay

## 2018-03-06 DIAGNOSIS — Z79899 Other long term (current) drug therapy: Secondary | ICD-10-CM

## 2018-03-07 ENCOUNTER — Telehealth: Payer: Self-pay | Admitting: Rheumatology

## 2018-03-07 LAB — COMPLETE METABOLIC PANEL WITH GFR
AG Ratio: 1.7 (calc) (ref 1.0–2.5)
ALT: 22 U/L (ref 6–29)
AST: 23 U/L (ref 10–35)
Albumin: 4.3 g/dL (ref 3.6–5.1)
Alkaline phosphatase (APISO): 55 U/L (ref 33–130)
BUN/Creatinine Ratio: 15 (calc) (ref 6–22)
BUN: 15 mg/dL (ref 7–25)
CALCIUM: 9.9 mg/dL (ref 8.6–10.4)
CO2: 26 mmol/L (ref 20–32)
CREATININE: 1 mg/dL — AB (ref 0.50–0.99)
Chloride: 106 mmol/L (ref 98–110)
GFR, EST NON AFRICAN AMERICAN: 59 mL/min/{1.73_m2} — AB (ref >=60)
GFR, Est African American: 68 mL/min/{1.73_m2} (ref >=60)
GLOBULIN: 2.6 g/dL (ref 1.9–3.7)
GLUCOSE: 73 mg/dL (ref 65–99)
Potassium: 4.4 mmol/L (ref 3.5–5.3)
Sodium: 140 mmol/L (ref 135–146)
Total Bilirubin: 0.7 mg/dL (ref 0.2–1.2)
Total Protein: 6.9 g/dL (ref 6.1–8.1)

## 2018-03-07 LAB — CBC WITH DIFFERENTIAL/PLATELET
BASOS PCT: 1 %
Basophils Absolute: 52 cells/uL (ref 0–200)
EOS PCT: 3.1 %
Eosinophils Absolute: 161 cells/uL (ref 15–500)
HCT: 40 % (ref 35.0–45.0)
Hemoglobin: 13 g/dL (ref 11.7–15.5)
Lymphs Abs: 1518 cells/uL (ref 850–3900)
MCH: 29.7 pg (ref 27.0–33.0)
MCHC: 32.5 g/dL (ref 32.0–36.0)
MCV: 91.3 fL (ref 80.0–100.0)
MONOS PCT: 15.4 %
MPV: 11.3 fL (ref 7.5–12.5)
Neutro Abs: 2668 cells/uL (ref 1500–7800)
Neutrophils Relative %: 51.3 %
Platelets: 334 10*3/uL (ref 140–400)
RBC: 4.38 10*6/uL (ref 3.80–5.10)
RDW: 11.8 % (ref 11.0–15.0)
Total Lymphocyte: 29.2 %
WBC: 5.2 10*3/uL (ref 3.8–10.8)
WBCMIX: 801 {cells}/uL (ref 200–950)

## 2018-03-07 NOTE — Telephone Encounter (Signed)
Advised patient of lab results and patient verbalized understanding. See lab note.

## 2018-03-07 NOTE — Telephone Encounter (Signed)
Patient called stating she was returning  Andrea's call regarding her labwork results. 

## 2018-03-07 NOTE — Progress Notes (Signed)
Creatinine borderline elevated and GFR borderline low. All other labs are WNL.

## 2018-04-09 NOTE — Progress Notes (Signed)
Office Visit Note  Patient: Terri Hudson             Date of Birth: 06-29-1950           MRN: [id number]             PCP: Maurice Small, MD Referring: Maurice Small, MD Visit Date: 04/23/2018 Occupation: @GUAROCC @  Subjective:  Medication management.   History of Present Illness: Alaira Level is a 67 y.o. female with history of polymyalgia rheumatica and osteoarthritis..  According to patient she has been tapering prednisone by 1 mg every month.  She is currently on prednisone 1 mg p.o. daily and Arava 10 mg p.o. daily.  She denies any muscle pain or muscle weakness.  She denies any joint pain.  She has osteoarthritis in her knee joints which is not causing much discomfort.  She is having a flare of ulcerative colitis which is causing lower back pain.  States she had blood in her stool about a month ago when she started using the steroid enema.  She currently does not have a gastroenterologist.  She would like a referral.  Activities of Daily Living:  Patient reports morning stiffness for 0 minutes.   Patient Denies nocturnal pain.  Difficulty dressing/grooming: Denies Difficulty climbing stairs: Denies Difficulty getting out of chair: Denies Difficulty using hands for taps, buttons, cutlery, and/or writing: Denies  Review of Systems  Constitutional: Positive for fatigue.  HENT: Negative for mouth sores, trouble swallowing, trouble swallowing and mouth dryness.   Eyes: Negative for pain, redness, itching and dryness.  Respiratory: Negative for shortness of breath, wheezing and difficulty breathing.   Cardiovascular: Negative for chest pain, palpitations and swelling in legs/feet.  Gastrointestinal: Positive for blood in stool. Negative for abdominal pain, nausea and vomiting.  Endocrine: Negative for increased urination.  Genitourinary: Negative for painful urination, nocturia and pelvic pain.  Musculoskeletal: Negative for arthralgias, joint pain, joint swelling and  morning stiffness.  Skin: Negative for rash and hair loss.  Allergic/Immunologic: Negative for susceptible to infections.  Neurological: Negative for dizziness, light-headedness, headaches, memory loss and weakness.  Hematological: Negative for bruising/bleeding tendency.  Psychiatric/Behavioral: Negative for confusion. The patient is not nervous/anxious.     PMFS History:  Patient Active Problem List   Diagnosis Date Noted  . High risk medication use 11/20/2017  . On prednisone therapy 11/20/2017  . Primary osteoarthritis of both knees 11/20/2017  . Spondylosis of lumbar spine 11/20/2017  . Ulcerative colitis ([place]) 11/20/2017  . Abnormal laboratory test 11/20/2017  . Polymyalgia rheumatica ([place]) 06/07/2017    Past Medical History:  Diagnosis Date  . Migraine   . Ulcerative colitis ([place])     Family History  Problem Relation Age of Onset  . Heart disease Mother   . Cancer Father        throat   . Osteoporosis Father   . Migraines Daughter   . Bowel Disease Son    Past Surgical History:  Procedure Laterality Date  . KNEE SURGERY Right    Social History   Social History Narrative  . Not on file    Objective: Vital Signs: BP (!) 142/86 (BP Location: Left Arm, Patient Position: Sitting, Cuff Size: Normal)   Pulse 60   Resp 14   Ht 5' 8"  (1.727 m)   Wt 145 lb 6.4 oz (66 kg)   BMI 22.11 kg/m    Physical Exam  Constitutional: She is oriented to person, place, and time. She appears well-developed  and well-nourished.  HENT:  Head: Normocephalic and atraumatic.  Eyes: Conjunctivae and EOM are normal.  Neck: Normal range of motion.  Cardiovascular: Normal rate, regular rhythm, normal heart sounds and intact distal pulses.  Pulmonary/Chest: Effort normal and breath sounds normal.  Abdominal: Soft. Bowel sounds are normal.  Lymphadenopathy:    She has no cervical adenopathy.  Neurological: She is alert and oriented to person, place, and time.  Skin: Skin is warm and  dry. Capillary refill takes less than 2 seconds.  Psychiatric: She has a normal mood and affect. Her behavior is normal.  Nursing note and vitals reviewed.    Musculoskeletal Exam: C-spine thoracic lumbar spine good range of motion.  Shoulder joints, elbow joints, wrist joints, MCPs PIPs DIPs were in good range of motion with no synovitis.  Hip joints, knee joints, ankles and MTP joints were in good range of motion with no synovitis.  He had no muscular weakness or tenderness on examination.  CDAI Exam: CDAI Score: Not documented Patient Global Assessment: Not documented; Provider Global Assessment: Not documented Swollen: Not documented; Tender: Not documented Joint Exam   Not documented   There is currently no information documented on the homunculus. Go to the Rheumatology activity and complete the homunculus joint exam.  Investigation: No additional findings.  Imaging: No results found.  Recent Labs: Lab Results  Component Value Date   WBC 5.2 03/06/2018   HGB 13.0 03/06/2018   PLT 334 03/06/2018   NA 140 03/06/2018   K 4.4 03/06/2018   CL 106 03/06/2018   CO2 26 03/06/2018   GLUCOSE 73 03/06/2018   BUN 15 03/06/2018   CREATININE 1.00 (H) 03/06/2018   BILITOT 0.7 03/06/2018   ALKPHOS 81 11/17/2015   AST 23 03/06/2018   ALT 22 03/06/2018   PROT 6.9 03/06/2018   ALBUMIN 3.8 11/17/2015   CALCIUM 9.9 03/06/2018   GFRAA 68 03/06/2018   QFTBGOLDPLUS NEGATIVE 06/18/2017    Speciality Comments: No specialty comments available.  Procedures:  No procedures performed Allergies: Patient has no known allergies.   Assessment / Plan:     Visit Diagnoses: Polymyalgia rheumatica ([facility])-patient had no increased muscle weakness or tenderness on tapering prednisone.  She is currently on prednisone 1 mg p.o. daily.  She will be tapering prednisone over the next 1 month.  High risk medication use - Arava 10 mg po qd. her labs are stable.  We will continue to monitor labs every 3  months.  On prednisone therapy-she will be getting off prednisone this month.DXA scan was ordered which is pending at this time.  Abnormal laboratory test - anti-CCP+, RF-, ANA-  Primary osteoarthritis of both knees-she is currently not having much discomfort in her knee joints.  Spondylosis of lumbar spine-she has been experiencing some lower back pain which she relates to ulcerative colitis.  Other ulcerative colitis with other complication ([place]) -she is having a flare of ulcerative colitis.  Patient states she started bleeding per rectum about a month ago.  She has been using steroid enemas which has been helpful.  I will make a GI referral for her.  Orders: Orders Placed This Encounter  Procedures  . Ambulatory referral to Gastroenterology   No orders of the defined types were placed in this encounter.   Follow-Up Instructions: Return in about 3 months (around 07/23/2018) for PMR , OA, UC.   Bo Merino, MD  Note - This record has been created using Editor, commissioning.  Chart creation errors have been sought,  but may not always  have been located. Such creation errors do not reflect on  the standard of medical care.

## 2018-04-10 ENCOUNTER — Other Ambulatory Visit: Payer: Self-pay | Admitting: Rheumatology

## 2018-04-10 NOTE — Telephone Encounter (Signed)
Last Visit: 11/20/17 Next Visit: 04/23/18 Labs: 03/06/18 Creatinine borderline elevated and GFR borderline low. All other labs are WNL.   Okay to refill per Dr. Estanislado Pandy

## 2018-04-23 ENCOUNTER — Telehealth: Payer: Self-pay | Admitting: Pharmacist

## 2018-04-23 ENCOUNTER — Ambulatory Visit (INDEPENDENT_AMBULATORY_CARE_PROVIDER_SITE_OTHER): Payer: Medicare HMO | Admitting: Rheumatology

## 2018-04-23 ENCOUNTER — Encounter: Payer: Self-pay | Admitting: Rheumatology

## 2018-04-23 VITALS — BP 142/86 | HR 60 | Resp 14 | Ht 68.0 in | Wt 145.4 lb

## 2018-04-23 DIAGNOSIS — Z79899 Other long term (current) drug therapy: Secondary | ICD-10-CM | POA: Diagnosis not present

## 2018-04-23 DIAGNOSIS — M170 Bilateral primary osteoarthritis of knee: Secondary | ICD-10-CM | POA: Diagnosis not present

## 2018-04-23 DIAGNOSIS — Z7952 Long term (current) use of systemic steroids: Secondary | ICD-10-CM

## 2018-04-23 DIAGNOSIS — M47816 Spondylosis without myelopathy or radiculopathy, lumbar region: Secondary | ICD-10-CM | POA: Diagnosis not present

## 2018-04-23 DIAGNOSIS — K51818 Other ulcerative colitis with other complication: Secondary | ICD-10-CM

## 2018-04-23 DIAGNOSIS — M353 Polymyalgia rheumatica: Secondary | ICD-10-CM | POA: Diagnosis not present

## 2018-04-23 DIAGNOSIS — R899 Unspecified abnormal finding in specimens from other organs, systems and tissues: Secondary | ICD-10-CM | POA: Diagnosis not present

## 2018-04-23 NOTE — Telephone Encounter (Signed)
Called patient to request DEXA be scheduled at [facility].  Instructed patient to call [phone] to schedule appointment at her convenience.   Also reminded patient she is due for labs in January.   Patient verbalized understanding.  Instructed patient to call with any other questions or concerns.

## 2018-04-23 NOTE — Patient Instructions (Signed)
Standing Labs We placed an order today for your standing lab work.    Please come back and get your standing labs in January and every 3 months   We have open lab Monday through Friday from 8:30-11:30 AM and 1:30-4:00 PM  at the office of Dr.  .   You may experience shorter wait times on Monday and Friday afternoons. The office is located at [address], [address], [place], [place] [address] No appointment is necessary.   Labs are drawn by [facility].  You may receive a bill from [facility] for your lab work. If you have any questions regarding directions or hours of operation,  please call [phone].   Just as a reminder please drink plenty of water prior to coming for your lab work. Thanks!   

## 2018-04-26 DIAGNOSIS — Z1231 Encounter for screening mammogram for malignant neoplasm of breast: Secondary | ICD-10-CM | POA: Diagnosis not present

## 2018-05-09 ENCOUNTER — Other Ambulatory Visit: Payer: Self-pay | Admitting: Rheumatology

## 2018-05-09 NOTE — Telephone Encounter (Signed)
Last Visit: 04/23/18 Next Visit: 08/06/18 Labs: 03/06/18 Creatinine borderline elevated and GFR borderline low. All other labs are WNL.   Okay to refill per Dr. Estanislado Pandy

## 2018-05-17 ENCOUNTER — Encounter: Payer: Self-pay | Admitting: Internal Medicine

## 2018-05-28 ENCOUNTER — Other Ambulatory Visit: Payer: Self-pay

## 2018-05-28 DIAGNOSIS — Z79899 Other long term (current) drug therapy: Secondary | ICD-10-CM

## 2018-05-28 LAB — COMPLETE METABOLIC PANEL WITH GFR
AG RATIO: 1.7 (calc) (ref 1.0–2.5)
ALT: 18 U/L (ref 6–29)
AST: 20 U/L (ref 10–35)
Albumin: 4.1 g/dL (ref 3.6–5.1)
Alkaline phosphatase (APISO): 54 U/L (ref 33–130)
BUN / CREAT RATIO: 15 (calc) (ref 6–22)
BUN: 17 mg/dL (ref 7–25)
CALCIUM: 9.8 mg/dL (ref 8.6–10.4)
CHLORIDE: 103 mmol/L (ref 98–110)
CO2: 25 mmol/L (ref 20–32)
Creat: 1.11 mg/dL — ABNORMAL HIGH (ref 0.50–0.99)
GFR, EST NON AFRICAN AMERICAN: 52 mL/min/{1.73_m2} — AB (ref >=60)
GFR, Est African American: 60 mL/min/{1.73_m2} (ref >=60)
GLOBULIN: 2.4 g/dL (ref 1.9–3.7)
Glucose, Bld: 81 mg/dL (ref 65–99)
POTASSIUM: 4.1 mmol/L (ref 3.5–5.3)
Sodium: 142 mmol/L (ref 135–146)
TOTAL PROTEIN: 6.5 g/dL (ref 6.1–8.1)
Total Bilirubin: 0.7 mg/dL (ref 0.2–1.2)

## 2018-05-28 LAB — CBC WITH DIFFERENTIAL/PLATELET
Absolute Monocytes: 756 cells/uL (ref 200–950)
Basophils Absolute: 32 cells/uL (ref 0–200)
Basophils Relative: 0.6 %
EOS PCT: 2 %
Eosinophils Absolute: 108 cells/uL (ref 15–500)
HCT: 41.1 % (ref 35.0–45.0)
Hemoglobin: 13.6 g/dL (ref 11.7–15.5)
Lymphs Abs: 875 cells/uL (ref 850–3900)
MCH: 30.4 pg (ref 27.0–33.0)
MCHC: 33.1 g/dL (ref 32.0–36.0)
MCV: 91.7 fL (ref 80.0–100.0)
MONOS PCT: 14 %
MPV: 11.4 fL (ref 7.5–12.5)
NEUTROS PCT: 67.2 %
Neutro Abs: 3629 cells/uL (ref 1500–7800)
PLATELETS: 292 10*3/uL (ref 140–400)
RBC: 4.48 10*6/uL (ref 3.80–5.10)
RDW: 11.5 % (ref 11.0–15.0)
Total Lymphocyte: 16.2 %
WBC: 5.4 10*3/uL (ref 3.8–10.8)

## 2018-05-29 NOTE — Progress Notes (Signed)
Creatinine is elevated and GFR is low.  Please advise patient to avoid NSAIDs.  She is on Arava 10 mg po daily.   CBC WNL.  Please forward labs to PCP.

## 2018-06-03 DIAGNOSIS — Z780 Asymptomatic menopausal state: Secondary | ICD-10-CM | POA: Diagnosis not present

## 2018-06-03 DIAGNOSIS — Z8262 Family history of osteoporosis: Secondary | ICD-10-CM | POA: Diagnosis not present

## 2018-06-03 DIAGNOSIS — K5190 Ulcerative colitis, unspecified, without complications: Secondary | ICD-10-CM | POA: Diagnosis not present

## 2018-06-03 DIAGNOSIS — M810 Age-related osteoporosis without current pathological fracture: Secondary | ICD-10-CM | POA: Diagnosis not present

## 2018-06-07 ENCOUNTER — Telehealth: Payer: Self-pay | Admitting: *Deleted

## 2018-06-07 NOTE — Telephone Encounter (Signed)
Patient advised Bone Density Scan show Osteoporosis. T-Score -2.5. Patient is not on treatment and has been scheduled for 06/20/18 at 2:00 pm to discuss treatment options.

## 2018-06-07 NOTE — Progress Notes (Signed)
Office Visit Note  Patient: Terri Hudson             Date of Birth: 1950-12-18           MRN: [id number]             PCP: Maurice Small, MD Referring: Maurice Small, MD Visit Date: 06/20/2018 Occupation: @GUAROCC @  Subjective:  Discuss treatment options   History of Present Illness: [name] is a 68 y.o. female with history of PMR, osteoarthritis, and osteoporosis.  She presents today to discuss treatment options for management of osteoporosis.  She was previously taking Fosamax for 2 months but discontinued due to experiencing arthralgias in both lower extremities. She restarted taking it once she received the DEXA result  She continues to take Arava 10 mg po daily for management of PMR.  She denies any recent flares.  She has been off of prednisone since 06/01/18. She does not take NSAIDs on a regular basis.     Activities of Daily Living:  Patient reports morning stiffness for0  minutes.   Patient Denies nocturnal pain.  Difficulty dressing/grooming: Denies Difficulty climbing stairs: Denies Difficulty getting out of chair: Denies Difficulty using hands for taps, buttons, cutlery, and/or writing: Denies  Review of Systems  Constitutional: Positive for fatigue.  HENT: Negative for mouth sores, mouth dryness and nose dryness.   Eyes: Negative for pain, visual disturbance and dryness.  Respiratory: Negative for cough, hemoptysis, shortness of breath and difficulty breathing.   Cardiovascular: Negative for chest pain, palpitations, hypertension and swelling in legs/feet.  Gastrointestinal: Positive for blood in stool and constipation (Hx of UC). Negative for diarrhea.  Endocrine: Negative for increased urination.  Genitourinary: Negative for painful urination.  Musculoskeletal: Negative for arthralgias, joint pain, joint swelling, myalgias, muscle weakness, morning stiffness, muscle tenderness and myalgias.  Skin: Negative for color change, pallor, rash, hair loss,  nodules/bumps, skin tightness, ulcers and sensitivity to sunlight.  Allergic/Immunologic: Negative for susceptible to infections.  Neurological: Negative for dizziness, numbness, headaches and weakness.  Hematological: Negative for swollen glands.  Psychiatric/Behavioral: Negative for depressed mood and sleep disturbance. The patient is not nervous/anxious.     PMFS History:  Patient Active Problem List   Diagnosis Date Noted  . High risk medication use 11/20/2017  . On prednisone therapy 11/20/2017  . Primary osteoarthritis of both knees 11/20/2017  . Spondylosis of lumbar spine 11/20/2017  . Ulcerative colitis ([place]) 11/20/2017  . Abnormal laboratory test 11/20/2017  . Polymyalgia rheumatica ([place]) 06/07/2017    Past Medical History:  Diagnosis Date  . Migraine   . Osteoarthritis   . Polymyalgia rheumatica ([place])   . Ulcerative colitis ([place])     Family History  Problem Relation Age of Onset  . Heart disease Mother   . Cancer Father        throat   . Osteoporosis Father   . Migraines Daughter   . Bowel Disease Son    Past Surgical History:  Procedure Laterality Date  . KNEE SURGERY Right    Social History   Social History Narrative  . Not on file    There is no immunization history on file for this patient.   Objective: Vital Signs: BP 121/83 (BP Location: Right Arm, Patient Position: Sitting, Cuff Size: Normal)   Pulse 68   Resp 13   Ht 5' 8"  (1.727 m)   Wt 141 lb 12.8 oz (64.3 kg)   BMI 21.56 kg/m    Physical Exam Vitals  signs and nursing note reviewed.  Constitutional:      Appearance: She is well-developed.  HENT:     Head: Normocephalic and atraumatic.  Eyes:     Conjunctiva/sclera: Conjunctivae normal.  Neck:     Musculoskeletal: Normal range of motion.  Cardiovascular:     Rate and Rhythm: Normal rate and regular rhythm.     Heart sounds: Normal heart sounds.  Pulmonary:     Effort: Pulmonary effort is normal.     Breath sounds: Normal breath  sounds.  Abdominal:     General: Bowel sounds are normal.     Palpations: Abdomen is soft.  Lymphadenopathy:     Cervical: No cervical adenopathy.  Skin:    General: Skin is warm and dry.     Capillary Refill: Capillary refill takes less than 2 seconds.  Neurological:     Mental Status: She is alert and oriented to person, place, and time.  Psychiatric:        Behavior: Behavior normal.      Musculoskeletal Exam: C-spine good ROM.  Thoracic kyphosis noted.  Lumbar spine good ROM with no discomfort.  No midline spinal tenderness.  No SI joint tenderness. Shoulder joints, elbow joints, wrist joints, MCPs, PIPs, and DIPs good ROM with no synovitis.  Complete fist formation bilaterally.  Hip joints, knee joints, ankle joints, MTPs, PIPs, and DIPs good ROM with no synovitis.  No warmth or effusion of knee joints.  No tenderness or swelling of ankle joints.    CDAI Exam: CDAI Score: Not documented Patient Global Assessment: Not documented; Provider Global Assessment: Not documented Swollen: Not documented; Tender: Not documented Joint Exam   Not documented   There is currently no information documented on the homunculus. Go to the Rheumatology activity and complete the homunculus joint exam.  Investigation: No additional findings.  Imaging: No results found.  Recent Labs: Lab Results  Component Value Date   WBC 5.4 05/28/2018   HGB 13.6 05/28/2018   PLT 292 05/28/2018   NA 142 05/28/2018   K 4.1 05/28/2018   CL 103 05/28/2018   CO2 25 05/28/2018   GLUCOSE 81 05/28/2018   BUN 17 05/28/2018   CREATININE 1.11 (H) 05/28/2018   BILITOT 0.7 05/28/2018   ALKPHOS 81 11/17/2015   AST 20 05/28/2018   ALT 18 05/28/2018   PROT 6.5 05/28/2018   ALBUMIN 3.8 11/17/2015   CALCIUM 9.8 05/28/2018   GFRAA 60 05/28/2018   QFTBGOLDPLUS NEGATIVE 06/18/2017    Speciality Comments: No specialty comments available.  Procedures:  No procedures performed Allergies: Patient has no known  allergies.   Assessment / Plan:     Visit Diagnoses: Polymyalgia rheumatica ([place]): She has not had any recent flares.  She is off prednisone.  Her last dose was 06/01/18.  She continues to take Arava 10 mg po daily.   She denies any muscle aches or muscle tenderness. She denies any muscle weakness.  No muscle weakness noted on exam. She denies any joint pain or joint swelling at this time.  She has no synovitis on exam.  She was advised to notify us if she develops any signs or symptoms of a flare. She will follow up in 5 months.    High risk medication use - Arava 10 mg po qd.  Creatinine is 1.11 and GFR is 52 on 05/28/18. She will return for lab work in 1 month and then every 3 months.   On prednisone therapy - She was previously on  longterm prednisone therapy.  She discontinued Prednisone on 06/01/18. DEXA 06/03/2018 T-score: -2.5, BMD: 0.574 right femoral neck.   Abnormal laboratory test - anti-CCP+, RF-, ANA-: She has no synovitis on exam.   Primary osteoarthritis of both knees: No warmth or effusion of knee joints.  She has good ROM with no discomfort.   Spondylosis of lumbar spine: She has good ROM with no discomfort.  No midline spinal tenderness.   Other ulcerative colitis with other complication ([place]) - She has been having a UC flare, and she will be following up with Dr. Hilarie Fredrickson on 06/24/2018 to discuss treatment options.   Age-related osteoporosis without current pathological fracture - 06/03/18: Right femoral neck BMD 0.574, T-score -2.5. We discussed DEXA results with the patient.  All questions were addressed.  She previously was taking Fosamax several months ago and took it for about 2 months and discontinued due to experienced arthralgias in both lower extremities.  She restarted Fosamax 70 mg once weekly once she found out about the DEXA results.  She has been tolerating it well.   We discussed that increasing dietary intake of calcium.  She cannot tolerate taking calcium supplements due  to it exacerbated UC flares.  She takes a vitamin D supplement.  We discussed resistive exercises.  We discussed walking and squatting.  She will work on balance exercises as well. She does not need a refill of Fosamax at this time.   Orders: No orders of the defined types were placed in this encounter.  Meds ordered this encounter  Medications  . leflunomide (ARAVA) 10 MG tablet    Sig: Take 1 tablet (10 mg total) by mouth daily.    Dispense:  90 tablet    Refill:  0    Face-to-face time spent with patient was 30 minutes. Greater than 50% of time was spent in counseling and coordination of care.  Follow-Up Instructions: Return in about 5 months (around 11/18/2018) for Polymyalgia Rheumatica, Osteoarthritis.   Ofilia Neas, PA-C  Note - This record has been created using Dragon software.  Chart creation errors have been sought, but may not always  have been located. Such creation errors do not reflect on  the standard of medical care.

## 2018-06-12 ENCOUNTER — Encounter: Payer: Self-pay | Admitting: *Deleted

## 2018-06-20 ENCOUNTER — Encounter: Payer: Self-pay | Admitting: Physician Assistant

## 2018-06-20 ENCOUNTER — Ambulatory Visit: Payer: Medicare HMO | Admitting: Physician Assistant

## 2018-06-20 VITALS — BP 121/83 | HR 68 | Resp 13 | Ht 68.0 in | Wt 141.8 lb

## 2018-06-20 DIAGNOSIS — M47816 Spondylosis without myelopathy or radiculopathy, lumbar region: Secondary | ICD-10-CM | POA: Diagnosis not present

## 2018-06-20 DIAGNOSIS — Z79899 Other long term (current) drug therapy: Secondary | ICD-10-CM | POA: Diagnosis not present

## 2018-06-20 DIAGNOSIS — M810 Age-related osteoporosis without current pathological fracture: Secondary | ICD-10-CM

## 2018-06-20 DIAGNOSIS — M170 Bilateral primary osteoarthritis of knee: Secondary | ICD-10-CM

## 2018-06-20 DIAGNOSIS — K51818 Other ulcerative colitis with other complication: Secondary | ICD-10-CM | POA: Diagnosis not present

## 2018-06-20 DIAGNOSIS — R899 Unspecified abnormal finding in specimens from other organs, systems and tissues: Secondary | ICD-10-CM | POA: Diagnosis not present

## 2018-06-20 DIAGNOSIS — Z7952 Long term (current) use of systemic steroids: Secondary | ICD-10-CM | POA: Diagnosis not present

## 2018-06-20 DIAGNOSIS — M353 Polymyalgia rheumatica: Secondary | ICD-10-CM | POA: Diagnosis not present

## 2018-06-20 MED ORDER — LEFLUNOMIDE 10 MG PO TABS: 10.0000 mg | ORAL_TABLET | Freq: Every day | ORAL | 0 refills | Status: DC

## 2018-06-20 NOTE — Patient Instructions (Addendum)
Standing Labs We placed an order today for your standing lab work.    Please come back and get your standing labs in 1 month and every 3 months   We have open lab Monday through Friday from 8:30-11:30 AM and 1:30-4:00 PM  at the office of Dr. Bo Merino.   You may experience shorter wait times on Monday and Friday afternoons. The office is located at [address] [place], [place], [place] [address] No appointment is necessary.   Labs are drawn by [facility].  You may receive a bill from [facility] for your lab work.  If you wish to have your labs drawn at another location, please call the office 24 hours in advance to send orders.  If you have any questions regarding directions or hours of operation,  please call [phone].   Just as a reminder please drink plenty of water prior to coming for your lab work. Thanks!

## 2018-06-24 ENCOUNTER — Ambulatory Visit: Payer: Medicare HMO | Admitting: Internal Medicine

## 2018-06-24 ENCOUNTER — Encounter: Payer: Self-pay | Admitting: Internal Medicine

## 2018-06-24 ENCOUNTER — Encounter

## 2018-06-24 VITALS — BP 112/72 | HR 67 | Ht 68.0 in | Wt 144.0 lb

## 2018-06-24 DIAGNOSIS — M353 Polymyalgia rheumatica: Secondary | ICD-10-CM

## 2018-06-24 DIAGNOSIS — K51911 Ulcerative colitis, unspecified with rectal bleeding: Secondary | ICD-10-CM | POA: Diagnosis not present

## 2018-06-24 MED ORDER — MESALAMINE 1.2 G PO TBEC: 2.4000 g | DELAYED_RELEASE_TABLET | Freq: Every day | ORAL | 3 refills | Status: DC

## 2018-06-24 MED ORDER — PEG-KCL-NACL-NASULF-NA ASC-C 140 G PO SOLR: 1.0000 | ORAL | 0 refills | Status: DC

## 2018-06-24 MED ORDER — MESALAMINE 1000 MG RE SUPP: 1000.0000 mg | Freq: Every day | RECTAL | 3 refills | Status: DC

## 2018-06-24 NOTE — Progress Notes (Signed)
Patient ID: Terri Hudson, female   DOB: 08-20-1950, 68 y.o.   MRN: [id number] HPI: Terri Hudson is a 68 yo female with PMH of ulcerative colitis (diagnosis 34 yrs ago in [place]), polymyalgia rheumatica, osteoarthritis who is seen in consultation at the request of Dr. Estanislado Pandy to establish care for ulcerative colitis.  She is here alone today.  She states that she was diagnosed with ulcerative colitis but she has also heard the word proctitis over 34 years ago in the [place].  In the past for her colitis she has taken a colo-foam enema, prednisone and sulfasalazine.  She was diagnosed with PMR 3 years ago and has slowly weaned off prednisone and is now taking [place].  She has been off of steroids completely since June 01, 2018 but her colitis symptoms started in November 2019 when she weaned down to a steroid dose of 3 mg.  Of note prior to the steroid initiation for PMR she did not have colitis symptoms.  Recently she has been having severe urgency, blood in her stool and more frequent stool.  She is having 3-4 formed stools with mucus and blood per day.  Normal for her is 1/day.  Good appetite.  No nausea or vomiting.  No fever.  Some mild lower abdominal discomfort and borborygmi type symptom.  No rashes, oral ulcers, ocular complaint or new joint pains.  Jolee Ewing seems to be working very well for her PMR symptom.  She does not use tobacco.  There is no family history of IBD.  Her last colonoscopy was performed in [place] on 03/19/2016.  This was normal with adequate preparation.  On this exam there was no evidence of colitis.  Past Medical History:  Diagnosis Date  . Migraine   . Osteoarthritis   . Polymyalgia rheumatica ([place])   . Ulcerative colitis [facility])     Past Surgical History:  Procedure Laterality Date  . KNEE SURGERY Right     Outpatient Medications Prior to Visit  Medication Sig Dispense Refill  . alendronate (FOSAMAX) 70 MG tablet Take 70 mg by mouth once a  week. Take with a full glass of water on an empty stomach.    Marland Kitchen atenolol (TENORMIN) 50 MG tablet Take 25 mg by mouth daily.     Marland Kitchen leflunomide (ARAVA) 10 MG tablet Take 1 tablet (10 mg total) by mouth daily. 90 tablet 0  . naratriptan (AMERGE) 2.5 MG tablet Take 2.5 mg by mouth as needed for migraine. Take one (1) tablet at onset of headache; if returns or does not resolve, may repeat after 4 hours; do not exceed five (5) mg in 24 hours.    Marland Kitchen OVER THE COUNTER MEDICATION daily.    . SUMAtriptan (IMITREX) 20 MG/ACT nasal spray Place 20 mg into the nose as needed for migraine or headache. May repeat in 2 hours if headache persists or recurs.     No facility-administered medications prior to visit.     No Known Allergies  Family History  Problem Relation Age of Onset  . Heart disease Mother   . Cancer Father        throat   . Osteoporosis Father   . Migraines Daughter   . Bowel Disease Son     Social History   Tobacco Use  . Smoking status: Never Smoker  . Smokeless tobacco: Never Used  Substance Use Topics  . Alcohol use: Yes    Comment: occasionally  . Drug use: Never  ROS: As per history of present illness, otherwise negative  BP 112/72   Pulse 67   Ht 5' 8"  (1.727 m)   Wt 144 lb (65.3 kg)   BMI 21.90 kg/m  Constitutional: Well-developed and well-nourished. No distress. HEENT: Normocephalic and atraumatic. Marland Kitchen Conjunctivae are normal.  No scleral icterus. Neck: Neck supple. Trachea midline. Cardiovascular: Normal rate, regular rhythm and intact distal pulses. No M/R/G Pulmonary/chest: Effort normal and breath sounds normal. No wheezing, rales or rhonchi. Abdominal: Soft, nontender, nondistended. Bowel sounds active throughout. There are no masses palpable. No hepatosplenomegaly. Extremities: no clubbing, cyanosis, or edema Neurological: Alert and oriented to person place and time. Skin: Skin is warm and dry.  Psychiatric: Normal mood and affect. Behavior is  normal.  RELEVANT LABS AND IMAGING: CBC    Component Value Date/Time   WBC 5.4 05/28/2018 1115   RBC 4.48 05/28/2018 1115   HGB 13.6 05/28/2018 1115   HCT 41.1 05/28/2018 1115   PLT 292 05/28/2018 1115   MCV 91.7 05/28/2018 1115   MCH 30.4 05/28/2018 1115   MCHC 33.1 05/28/2018 1115   RDW 11.5 05/28/2018 1115   LYMPHSABS 875 05/28/2018 1115   MONOABS 0.9 11/17/2015 1604   EOSABS 108 05/28/2018 1115   BASOSABS 32 05/28/2018 1115    CMP     Component Value Date/Time   NA 142 05/28/2018 1115   K 4.1 05/28/2018 1115   CL 103 05/28/2018 1115   CO2 25 05/28/2018 1115   GLUCOSE 81 05/28/2018 1115   BUN 17 05/28/2018 1115   CREATININE 1.11 (H) 05/28/2018 1115   CALCIUM 9.8 05/28/2018 1115   PROT 6.5 05/28/2018 1115   ALBUMIN 3.8 11/17/2015 1604   AST 20 05/28/2018 1115   ALT 18 05/28/2018 1115   ALKPHOS 81 11/17/2015 1604   BILITOT 0.7 05/28/2018 1115   GFRNONAA 52 (L) 05/28/2018 1115   GFRAA 60 05/28/2018 1115    ASSESSMENT/PLAN: 68 yo female with PMH of ulcerative colitis (diagnosis 34 yrs ago in [place]), polymyalgia rheumatica, osteoarthritis who is seen in consultation at the request of Dr. Estanislado Pandy to establish care for ulcerative colitis.  1. Long-standing UC versus ulcerative proctitis --she is having symptoms of active proctitis but possibly left-sided colitis.  We discussed the importance of understanding her prior disease extent because this has implications for surveillance examination.  I have explained that ulcerative proctitis does not confer an increased risk for colon cancer however if she had left-sided colitis of longstanding duration surveillance exams every 1 to 2 years are indicated. --At present I have recommended that we perform colonoscopy to evaluate the activity and extent of her colitis.  We discussed the risk, benefits and alternatives and she is agreeable and wishes to proceed --Begin Lialda 2.4 g daily --Begin Canasa 1 g nightly  I looked up [place]  again and it does not seem to be associated with an increased risk of IBD.  It is interesting that as she tapered steroids for PMR her colitis symptoms returned, having previously been clinically quiescent before steroids.    BW:[id number], [place], [place], [place] [id number]

## 2018-06-24 NOTE — Patient Instructions (Signed)
You have been scheduled for a colonoscopy. Please follow written instructions given to you at your visit today.  Please pick up your prep supplies at the pharmacy within the next 1-3 days. If you use inhalers (even only as needed), please bring them with you on the day of your procedure. Your physician has requested that you go to www.startemmi.com and enter the access code given to you at your visit today. This web site gives a general overview about your procedure. However, you should still follow specific instructions given to you by our office regarding your preparation for the procedure.  We have sent the following medications to your pharmacy for you to pick up at your convenience: Lialda 2.4 grams daily Canasa 1 gram every night.  If you are age 46 or older, your body mass index should be between 23-30. Your Body mass index is 21.9 kg/m. If this is out of the aforementioned range listed, please consider follow up with your Primary Care Provider.  If you are age 8 or younger, your body mass index should be between 19-25. Your Body mass index is 21.9 kg/m. If this is out of the aformentioned range listed, please consider follow up with your Primary Care Provider.

## 2018-06-25 ENCOUNTER — Encounter: Payer: Self-pay | Admitting: Internal Medicine

## 2018-06-27 ENCOUNTER — Telehealth: Payer: Self-pay | Admitting: Internal Medicine

## 2018-07-01 ENCOUNTER — Telehealth: Payer: Self-pay | Admitting: *Deleted

## 2018-07-01 NOTE — Telephone Encounter (Signed)
Patient's mesalamine has been approved or lower copayment through [facility] from 05/14/18-05/15/19. Referral #[id number].

## 2018-07-08 ENCOUNTER — Other Ambulatory Visit: Payer: Self-pay

## 2018-07-08 DIAGNOSIS — Z79899 Other long term (current) drug therapy: Secondary | ICD-10-CM | POA: Diagnosis not present

## 2018-07-09 LAB — CBC WITH DIFFERENTIAL/PLATELET
ABSOLUTE MONOCYTES: 655 {cells}/uL (ref 200–950)
Basophils Absolute: 40 cells/uL (ref 0–200)
Basophils Relative: 0.8 %
Eosinophils Absolute: 160 cells/uL (ref 15–500)
Eosinophils Relative: 3.2 %
HCT: 37.1 % (ref 35.0–45.0)
Hemoglobin: 12.4 g/dL (ref 11.7–15.5)
Lymphs Abs: 1270 cells/uL (ref 850–3900)
MCH: 30.6 pg (ref 27.0–33.0)
MCHC: 33.4 g/dL (ref 32.0–36.0)
MCV: 91.6 fL (ref 80.0–100.0)
MPV: 11.3 fL (ref 7.5–12.5)
Monocytes Relative: 13.1 %
Neutro Abs: 2875 cells/uL (ref 1500–7800)
Neutrophils Relative %: 57.5 %
Platelets: 296 10*3/uL (ref 140–400)
RBC: 4.05 10*6/uL (ref 3.80–5.10)
RDW: 11.6 % (ref 11.0–15.0)
Total Lymphocyte: 25.4 %
WBC: 5 10*3/uL (ref 3.8–10.8)

## 2018-07-09 LAB — COMPLETE METABOLIC PANEL WITH GFR
AG Ratio: 1.6 (calc) (ref 1.0–2.5)
ALBUMIN MSPROF: 3.9 g/dL (ref 3.6–5.1)
ALT: 18 U/L (ref 6–29)
AST: 21 U/L (ref 10–35)
Alkaline phosphatase (APISO): 52 U/L (ref 37–153)
BUN: 16 mg/dL (ref 7–25)
CO2: 27 mmol/L (ref 20–32)
CREATININE: 0.83 mg/dL (ref 0.50–0.99)
Calcium: 9.8 mg/dL (ref 8.6–10.4)
Chloride: 105 mmol/L (ref 98–110)
GFR, Est African American: 86 mL/min/{1.73_m2} (ref >=60)
GFR, Est Non African American: 74 mL/min/{1.73_m2} (ref >=60)
Globulin: 2.4 g/dL (calc) (ref 1.9–3.7)
Glucose, Bld: 103 mg/dL — ABNORMAL HIGH (ref 65–99)
Potassium: 4.1 mmol/L (ref 3.5–5.3)
Sodium: 139 mmol/L (ref 135–146)
Total Bilirubin: 0.7 mg/dL (ref 0.2–1.2)
Total Protein: 6.3 g/dL (ref 6.1–8.1)

## 2018-07-09 NOTE — Progress Notes (Signed)
Glucose is 103. Rest of lab work is WNL.

## 2018-07-16 ENCOUNTER — Encounter: Payer: Self-pay | Admitting: Internal Medicine

## 2018-07-16 ENCOUNTER — Ambulatory Visit (AMBULATORY_SURGERY_CENTER): Payer: Medicare HMO | Admitting: Internal Medicine

## 2018-07-16 ENCOUNTER — Other Ambulatory Visit: Payer: Self-pay

## 2018-07-16 VITALS — BP 116/62 | HR 84 | Temp 96.9°F | Resp 15 | Ht 68.0 in | Wt 144.0 lb

## 2018-07-16 DIAGNOSIS — K51911 Ulcerative colitis, unspecified with rectal bleeding: Secondary | ICD-10-CM | POA: Diagnosis not present

## 2018-07-16 DIAGNOSIS — K5190 Ulcerative colitis, unspecified, without complications: Secondary | ICD-10-CM | POA: Diagnosis not present

## 2018-07-16 DIAGNOSIS — I10 Essential (primary) hypertension: Secondary | ICD-10-CM | POA: Diagnosis not present

## 2018-07-16 DIAGNOSIS — M353 Polymyalgia rheumatica: Secondary | ICD-10-CM | POA: Diagnosis not present

## 2018-07-16 MED ORDER — BUDESONIDE ER 9 MG PO TB24: 9.0000 mg | ORAL_TABLET | Freq: Every day | ORAL | 0 refills | Status: DC

## 2018-07-16 MED ORDER — SODIUM CHLORIDE 0.9 % IV SOLN: 500.0000 mL | Freq: Once | INTRAVENOUS | Status: DC

## 2018-07-16 MED ORDER — MESALAMINE 4 G RE ENEM: 4.0000 g | ENEMA | Freq: Every day | RECTAL | 5 refills | Status: DC

## 2018-07-16 NOTE — Progress Notes (Signed)
Report to PACU, RN, vss, BBS= Clear.  

## 2018-07-16 NOTE — Progress Notes (Signed)
Called to room to assist during endoscopic procedure.  Patient ID and intended procedure confirmed with present staff. Received instructions for my participation in the procedure from the performing physician.  

## 2018-07-16 NOTE — Patient Instructions (Signed)
YOU HAD AN ENDOSCOPIC PROCEDURE TODAY AT [facility]:   Refer to the procedure report that was given to you for any specific questions about what was found during the examination.  If the procedure report does not answer your questions, please call your gastroenterologist to clarify.  If you requested that your care partner not be given the details of your procedure findings, then the procedure report has been included in a sealed envelope for you to review at your convenience later.  YOU SHOULD EXPECT: Some feelings of bloating in the abdomen. Passage of more gas than usual.  Walking can help get rid of the air that was put into your GI tract during the procedure and reduce the bloating. If you had a lower endoscopy (such as a colonoscopy or flexible sigmoidoscopy) you may notice spotting of blood in your stool or on the toilet paper. If you underwent a bowel prep for your procedure, you may not have a normal bowel movement for a few days.  Please Note:  You might notice some irritation and congestion in your nose or some drainage.  This is from the oxygen used during your procedure.  There is no need for concern and it should clear up in a day or so.  SYMPTOMS TO REPORT IMMEDIATELY:   Following lower endoscopy (colonoscopy or flexible sigmoidoscopy):  Excessive amounts of blood in the stool  Significant tenderness or worsening of abdominal pains  Swelling of the abdomen that is new, acute  Fever of 100F or higher  For urgent or emergent issues, a gastroenterologist can be reached at any hour by calling [phone].   DIET:  We do recommend a small meal at first, but then you may proceed to your regular diet.  Drink plenty of fluids but you should avoid alcoholic beverages for 24 hours.  ACTIVITY:  You should plan to take it easy for the rest of today and you should NOT DRIVE or use heavy machinery until tomorrow (because of the sedation medicines used during the test).     FOLLOW UP: Our staff will call the number listed on your records the next business day following your procedure to check on you and address any questions or concerns that you may have regarding the information given to you following your procedure. If we do not reach you, we will leave a message.  However, if you are feeling well and you are not experiencing any problems, there is no need to return our call.  We will assume that you have returned to your regular daily activities without incident.  If any biopsies were taken you will be contacted by phone or by letter within the next 1-3 weeks.  Please call us at [phone] if you have not heard about the biopsies in 3 weeks.   Await for biopsy results Change Liada and Canasa to Rowasa  Enemas 4 g daily at bedtime Begin uceris 9 mg x daily x 6 weeks  SIGNATURES/CONFIDENTIALITY: You and/or your care partner have signed paperwork which will be entered into your electronic medical record.  These signatures attest to the fact that that the information above on your After Visit Summary has been reviewed and is understood.  Full responsibility of the confidentiality of this discharge information lies with you and/or your care-partner.

## 2018-07-17 ENCOUNTER — Telehealth: Payer: Self-pay | Admitting: *Deleted

## 2018-07-17 ENCOUNTER — Telehealth: Payer: Self-pay

## 2018-07-17 ENCOUNTER — Telehealth: Payer: Self-pay | Admitting: Internal Medicine

## 2018-07-17 NOTE — Telephone Encounter (Signed)
Pt is returning your call

## 2018-07-17 NOTE — Op Note (Signed)
[facility] Patient Name: Terri Hudson Procedure Date: 07/16/2018 11:27 AM MRN: [id number] Endoscopist: Jerene Bears , MD Age: 68 Referring MD:  Date of Birth: [birth year] Gender: Female Account #: [id number] Procedure:                Colonoscopy Indications:              Chronic ulcerative proctosigmoiditis, Disease                            activity assessment of chronic ulcerative                            proctosigmoiditis diagnosis approximately 1986 in                            [place], recent loose bloody stools (now 40%                            better per patient with initiation of Lialda and                            Canasa) Medicines:                Monitored Anesthesia Care Procedure:                Pre-Anesthesia Assessment:                           - Prior to the procedure, a History and Physical                            was performed, and patient medications and                            allergies were reviewed. The patient's tolerance of                            previous anesthesia was also reviewed. The risks                            and benefits of the procedure and the sedation                            options and risks were discussed with the patient.                            All questions were answered, and informed consent                            was obtained. Prior Anticoagulants: The patient has                            taken no previous anticoagulant or antiplatelet  agents. ASA Grade Assessment: II - A patient with                            mild systemic disease. After reviewing the risks                            and benefits, the patient was deemed in                            satisfactory condition to undergo the procedure.                           After obtaining informed consent, the colonoscope                            was passed under direct vision. Throughout the          procedure, the patient's blood pressure, pulse, and                            oxygen saturations were monitored continuously. The                            Colonoscope was introduced through the anus and                            advanced to the cecum, identified by appendiceal                            orifice and ileocecal valve. The colonoscopy was                            performed without difficulty. The patient tolerated                            the procedure well. The quality of the bowel                            preparation was good. The ileocecal valve,                            appendiceal orifice, and rectum were photographed. Scope In: 11:33:04 AM Scope Out: 11:51:09 AM Scope Withdrawal Time: 0 hours 10 minutes 18 seconds  Total Procedure Duration: 0 hours 18 minutes 5 seconds  Findings:                 The digital rectal exam was normal.                           Inflammation characterized by altered vascularity,                            congestion (edema), erosions, erythema and  granularity was found in a continuous and                            circumferential pattern from the rectum to the                            sigmoid colon. The proximal sigmoid colon, the mid                            sigmoid colon, the descending colon, the transverse                            colon, the ascending colon and the cecum were                            spared. This was mild to moderate in severity.                            Biopsies were taken in the involved segments with a                            cold forceps for histology.                           Internal hemorrhoids were found during                            retroflexion. The hemorrhoids were small. Complications:            No immediate complications. Estimated Blood Loss:     Estimated blood loss was minimal. Impression:               - Proctosigmoid ulcerative colitis.  Inflammation                            was found from the rectum to the distal sigmoid                            colon (15-20 cm from dentate line to distal                            sigmoid). This was mild to moderate in severity.                            Biopsied.                           - Small internal hemorrhoids. Recommendation:           - Patient has a contact number available for                            emergencies. The signs and symptoms of potential  delayed complications were discussed with the                            patient. Return to normal activities tomorrow.                            Written discharge instructions were provided to the                            patient.                           - Resume previous diet.                           - Continue present medications. Change Lialda and                            Canasa to Rowasa enemas 4 g daily at bedtime.                           - Begin Uceris 9 mg x daily x 6 weeks.                           - Await pathology results.                           - Repeat colonoscopy is recommended for                            surveillance. The colonoscopy date will be                            determined after pathology results from today's                            exam become available for review. Jerene Bears, MD 07/16/2018 12:00:56 PM This report has been signed electronically.

## 2018-07-17 NOTE — Telephone Encounter (Signed)
-----   Message from Jerene Bears, MD sent at 07/17/2018  2:03 PM EST ----- Regarding: FW: question re medications Contact: [phone] Please let patient know these medications should be okay together Rowasa (she was already on but in a different form, previously on Canasa and Lialda) Uceris is mostly not absorbed If it would be help she can run this by the prescribing provider for her leflunomide too JMP ----- Message ----- From: Phineas Semen, RN Sent: 07/17/2018   1:48 PM EST To: Jerene Bears, MD Subject: question re medications                        Dr. Hilarie Fredrickson, The patient would like you or your nurse to call her to clarify that the two new medications you prescribed for her ( Rowasa and Uceris) are ok to take with her arthritis medication (Leflunomide).

## 2018-07-17 NOTE — Telephone Encounter (Signed)
  Follow up Call-  Call back number 07/16/2018  Post procedure Call Back phone  # [phone]  Permission to leave phone message Yes     Patient questions:  Do you have a fever, pain , or abdominal swelling? No. Pain Score  0 *  Have you tolerated food without any problems? Yes.    Have you been able to return to your normal activities? Yes.    Do you have any questions about your discharge instructions: Diet   No. Medications  No. Follow up visit  No.  Do you have questions or concerns about your Care? No.  Actions: * If pain score is 4 or above: No action needed, pain <4.

## 2018-07-17 NOTE — Telephone Encounter (Signed)
Pt had colon 3.3.20 and has questions regarding her medication changes.

## 2018-07-17 NOTE — Telephone Encounter (Signed)
Left message on answering machine. 

## 2018-07-17 NOTE — Telephone Encounter (Signed)
Patient already spoke with Loni Dolly, RN.  States she is doing well.

## 2018-07-17 NOTE — Telephone Encounter (Signed)
Left voicemail for patient to call back. 

## 2018-07-18 ENCOUNTER — Encounter: Payer: Self-pay | Admitting: *Deleted

## 2018-07-18 NOTE — Telephone Encounter (Signed)
Patient advised and she verbalizes understanding. She also indicates that she thinks her Uceris needs prior authorization because it was going to be $2000 dollars. I have completed prior authorization request on [email or web address] and we will await response from insurance.

## 2018-07-19 NOTE — Telephone Encounter (Signed)
Patient has been advised that her Gaetano Hawthorne has been approved by [place] from 05/13/18-05/15/19. Reference  #[id number].

## 2018-07-22 ENCOUNTER — Telehealth: Payer: Self-pay | Admitting: Internal Medicine

## 2018-07-22 NOTE — Telephone Encounter (Signed)
Pt stated that she was returning your call and that you can reach her after 12:00pm

## 2018-07-22 NOTE — Telephone Encounter (Signed)
See 07/16/2018 surgical pathology note for further correspondence. I have left a message for patient to call back.

## 2018-08-06 ENCOUNTER — Ambulatory Visit: Payer: Medicare HMO | Admitting: Rheumatology

## 2018-08-08 ENCOUNTER — Other Ambulatory Visit: Payer: Self-pay | Admitting: Internal Medicine

## 2018-08-08 DIAGNOSIS — K51911 Ulcerative colitis, unspecified with rectal bleeding: Secondary | ICD-10-CM

## 2018-08-27 ENCOUNTER — Telehealth: Payer: Self-pay | Admitting: Internal Medicine

## 2018-08-27 NOTE — Telephone Encounter (Signed)
Pt requested a refill for Budesonide.  Pt is scheduled for Zoom visit 09/16/18.

## 2018-08-27 NOTE — Telephone Encounter (Signed)
Advised patient that she was only to take a 6 week course of budesonide. She states she is doing much better and only has a few more tablets of this to go before finishing. I have advised she should finish the budesonide and continue rowasa enemas until her visit on 09/16/18. She verbalizes understanding.

## 2018-09-13 ENCOUNTER — Ambulatory Visit: Payer: Medicare HMO | Admitting: Internal Medicine

## 2018-09-16 ENCOUNTER — Other Ambulatory Visit: Payer: Self-pay

## 2018-09-16 ENCOUNTER — Ambulatory Visit (INDEPENDENT_AMBULATORY_CARE_PROVIDER_SITE_OTHER): Payer: Medicare HMO | Admitting: Internal Medicine

## 2018-09-16 ENCOUNTER — Encounter: Payer: Self-pay | Admitting: Internal Medicine

## 2018-09-16 VITALS — Ht 68.0 in | Wt 136.0 lb

## 2018-09-16 DIAGNOSIS — K51319 Ulcerative (chronic) rectosigmoiditis with unspecified complications: Secondary | ICD-10-CM

## 2018-09-16 MED ORDER — FOLIC ACID 1 MG PO TABS: 1.0000 mg | ORAL_TABLET | Freq: Every day | ORAL | 0 refills | Status: DC

## 2018-09-16 MED ORDER — SULFASALAZINE 500 MG PO TABS: 1000.0000 mg | ORAL_TABLET | Freq: Two times a day (BID) | ORAL | 0 refills | Status: DC

## 2018-09-16 MED ORDER — BUDESONIDE 2 MG/ACT RE FOAM: 2.0000 mg | Freq: Every day | RECTAL | 1 refills | Status: DC

## 2018-09-16 NOTE — Progress Notes (Signed)
   Subjective:    Patient ID: Terri Hudson, female    DOB: 1950/11/06, 68 y.o.   MRN: [id number]  This service was provided via telemedicine.  Telephone call The patient was located at home The provider was located in provider's GI office. The patient did consent to this telephone visit and is aware of possible charges through their insurance for this visit.   The persons participating in this telemedicine service were the patient and I. Time spent on call:  18 min   HPI Terri Hudson is a 68 year old female with a history of ulcerative proctosigmoiditis (diagnosed 34 years ago in [place]), PMR, osteoarthritis here for follow-up.  Virtual visit in the setting of COVID-19.  She was initially seen on 06/24/2018 in the office and then for colonoscopy on 07/16/2018.  Colonoscopy revealed ulcerative proctosigmoiditis involving the rectum and distal sigmoid.  Biopsies consistent with the same.  Remainder of the colon was normal.  After the colonoscopy we had her do a 6-week course of oral Uceris 9 mg daily and Rowasa enemas nightly.  She reports that her rectal bleeding has stopped.  She is still having fecal urgency in the morning.  She will have 4-5 loose but not watery stools.  Lower abdominal pain is better.  There is still some borborygmi.  She is unsure if the enemas are causing some of this urgency.  She does not have additional bowel movements after about noon.  She feels very restricted from an activity perspective prior to noon due to the possibility of urgent bowel movements.  Appetite has been good.  No nausea or vomiting.  No fevers.  She has been free from any upper respiratory/viral symptoms.   Review of Systems As per HPI, otherwise negative  Current Medications, Allergies, Past Medical History, Past Surgical History, Family History and Social History were reviewed in [facility] record.     Objective:   Physical Exam No physical examination, virtual visit       Assessment & Plan:   68 year old female with a history of ulcerative proctosigmoiditis (diagnosed 34 years ago in [place]), PMR, osteoarthritis here for follow-up.   1.  Ulcerative proctosigmoiditis --no definitive improvement with the oral budesonide and the Rowasa may be causing some loose stools and urgency symptoms.  Of course this may be secondary to IBD as well.  I recommended the following --Discontinue Rowasa --Begin Uceris foam 2 mg, 1 m dose, nightly x6 weeks.  Hopefully the phone formulation will cause less urgency. --Begin sulfasalazine 1 g twice daily.  She has taken this before and tolerated it. --Begin folate 1 mg daily --Would anticipate the need for sulfasalazine somewhat long-term hopefully to achieve a deep remission. --Office follow-up, or virtual visit in about 6 weeks

## 2018-09-16 NOTE — Patient Instructions (Addendum)
Discontinue Rowasa  We have sent the following medications to your pharmacy for you to pick up at your convenience: Uceris foam 2 mg, 1 m dose, nightly x 6 weeks.  (Hopefully the foam formulation will cause less urgency) sulfasalazine 1 g twice daily. folate 1 mg daily  Please follow up with Dr Hilarie Fredrickson in 6 weeks in office. Call in about 2 weeks for appointment as we currently do not have a June/July calender available.

## 2018-09-16 NOTE — Addendum Note (Signed)
Addended by: Larina Bras on: 09/16/2018 11:57 AM   Modules accepted: Orders

## 2018-09-17 ENCOUNTER — Telehealth: Payer: Self-pay | Admitting: Internal Medicine

## 2018-09-17 NOTE — Telephone Encounter (Signed)
Per patient, insurance requires prior authorization for uceris foam. I have contacted patient insurance and spoke with Julie(PA- phone [phone]) and have received approval for uceris foam until 05/15/2019.   Patient has ulcerative rectosigmoiditis and has tried and failed prednisone, rowasa, lialda, canasa and uceris oral in the past.

## 2018-09-17 NOTE — Telephone Encounter (Signed)
PA Referral # from [facility] is [id number].

## 2018-09-17 NOTE — Telephone Encounter (Signed)
Pt wants to speak to the nurse about the medication.

## 2018-10-03 ENCOUNTER — Other Ambulatory Visit: Payer: Self-pay | Admitting: Physician Assistant

## 2018-10-03 NOTE — Telephone Encounter (Signed)
Last Visit: 06/20/18 Next Visit: 11/21/18 Labs: 07/08/18 Glucose is 103. Rest of lab work is WNL.   Left message to remind patient she is due for labs this month  Okay to refill per Dr. Estanislado Pandy

## 2018-10-08 ENCOUNTER — Telehealth: Payer: Self-pay | Admitting: Internal Medicine

## 2018-10-08 NOTE — Telephone Encounter (Signed)
Pt requested a call back to discuss Uceris and mesalamine.

## 2018-10-09 ENCOUNTER — Other Ambulatory Visit: Payer: Self-pay | Admitting: *Deleted

## 2018-10-09 DIAGNOSIS — Z79899 Other long term (current) drug therapy: Secondary | ICD-10-CM

## 2018-10-09 NOTE — Telephone Encounter (Signed)
Patient states that she has been taking the uceris foam as well as sulfasalazine 1 gram twice daily since we saw her on 09/16/18 and has seen very little improvement in her symptoms. She indicates that she is having quite a bit of fecal urgency, going 6-7 times daily and at times almost not making it to the restroom. She is having small amounts of brb as well as rectal pain.  Dr Hilarie Fredrickson, any recommendations?

## 2018-10-10 ENCOUNTER — Other Ambulatory Visit: Payer: Self-pay | Admitting: Internal Medicine

## 2018-10-10 LAB — CBC WITH DIFFERENTIAL/PLATELET
Absolute Monocytes: 687 cells/uL (ref 200–950)
Basophils Absolute: 51 cells/uL (ref 0–200)
Basophils Relative: 1.5 %
Eosinophils Absolute: 41 cells/uL (ref 15–500)
Eosinophils Relative: 1.2 %
HCT: 39.1 % (ref 35.0–45.0)
Hemoglobin: 12.8 g/dL (ref 11.7–15.5)
Lymphs Abs: 983 cells/uL (ref 850–3900)
MCH: 30.5 pg (ref 27.0–33.0)
MCHC: 32.7 g/dL (ref 32.0–36.0)
MCV: 93.1 fL (ref 80.0–100.0)
MPV: 10.8 fL (ref 7.5–12.5)
Monocytes Relative: 20.2 %
Neutro Abs: 1639 cells/uL (ref 1500–7800)
Neutrophils Relative %: 48.2 %
Platelets: 303 10*3/uL (ref 140–400)
RBC: 4.2 10*6/uL (ref 3.80–5.10)
RDW: 11.8 % (ref 11.0–15.0)
Total Lymphocyte: 28.9 %
WBC: 3.4 10*3/uL — ABNORMAL LOW (ref 3.8–10.8)

## 2018-10-10 LAB — COMPLETE METABOLIC PANEL WITH GFR
AG Ratio: 1.7 (calc) (ref 1.0–2.5)
ALT: 25 U/L (ref 6–29)
AST: 24 U/L (ref 10–35)
Albumin: 4.1 g/dL (ref 3.6–5.1)
Alkaline phosphatase (APISO): 53 U/L (ref 37–153)
BUN: 13 mg/dL (ref 7–25)
CO2: 24 mmol/L (ref 20–32)
Calcium: 9.8 mg/dL (ref 8.6–10.4)
Chloride: 108 mmol/L (ref 98–110)
Creat: 0.88 mg/dL (ref 0.50–0.99)
GFR, Est African American: 80 mL/min/{1.73_m2} (ref >=60)
GFR, Est Non African American: 69 mL/min/{1.73_m2} (ref >=60)
Globulin: 2.4 g/dL (calc) (ref 1.9–3.7)
Glucose, Bld: 95 mg/dL (ref 65–99)
Potassium: 4.3 mmol/L (ref 3.5–5.3)
Sodium: 139 mmol/L (ref 135–146)
Total Bilirubin: 0.7 mg/dL (ref 0.2–1.2)
Total Protein: 6.5 g/dL (ref 6.1–8.1)

## 2018-10-10 MED ORDER — SULFASALAZINE 500 MG PO TABS: 1000.0000 mg | ORAL_TABLET | Freq: Four times a day (QID) | ORAL | 0 refills | Status: DC

## 2018-10-10 MED ORDER — HYDROCORTISONE ACETATE 25 MG RE SUPP: 25.0000 mg | Freq: Every day | RECTAL | 1 refills | Status: DC

## 2018-10-10 NOTE — Telephone Encounter (Signed)
Patient has been advised to increase sulfasalazine to 1 g QID, discontinue uceris foam for now and start hydrocortisone suppositories every night. She should keep follow up appointment 10/30/18 at which time we will discuss possible escalation to biologic therapy should her symptoms not improve with change of therapy. She verbalizes understanding of this. Hydrocortisone suppositories sent to pharmacy.

## 2018-10-10 NOTE — Progress Notes (Signed)
Repeat CBC in 1 month.

## 2018-10-10 NOTE — Telephone Encounter (Signed)
Left message for patient to call back  

## 2018-10-10 NOTE — Telephone Encounter (Signed)
Patient has tried Uceris, Rowasa, and most recently sulfasalazine by mouth and Uceris foam PR Still with ongoing symptoms of proctosigmoiditis  I would recommend the following and if no improvement in 2 to 4 weeks we may need to consider escalation of therapy to include a biologic medication such as Humira --Increase sulfasalazine to 1 g 4 times daily --Continue supplemental folate --Discontinue budesonide foam and begin hydrocortisone suppository 25 mg nightly --Office follow-up or virtual visit in 3 to 4 weeks

## 2018-10-11 ENCOUNTER — Telehealth: Payer: Self-pay | Admitting: Internal Medicine

## 2018-10-11 MED ORDER — HYDROCORTISONE ACETATE 25 MG RE SUPP: 25.0000 mg | Freq: Every day | RECTAL | 1 refills | Status: DC

## 2018-10-11 NOTE — Telephone Encounter (Signed)
Patient prefers to try hydrocortisone suppositories. She will use [facility] and goodrx.

## 2018-10-11 NOTE — Telephone Encounter (Signed)
I am sorry to hear this drug is not covered and it is ridiculously expensive  Here are her 2 options: --Obtain hydrocortisone suppository 25 mg for use nightly from [employer] with good Rx coupon; the listed online price is $90 for 28 suppository (if this is affordable) --Or try a prednisone taper 40 mg x 7 days and down by 5 mg every 7 days until off --All to the above options would be with the newer sulfasalazine at 1 g 4 times daily

## 2018-10-11 NOTE — Telephone Encounter (Signed)
I attempted PA for hydrocortisone suppositories on this patient for her rectosigmoiditis and was sent a denial from her Medicare part D. "The drug you are asking [place] to cover is a DESI drug. This means that the FDA considers the drug to be safe but possibly less-than-effective. A DESI drug is a medication that was approved before 1962 based on safety only. After 1962, Congress required drug companies to show that medications were effective or worked properly as well. Unfortunately, Medicare part D rules do not allow coverage of DESI drugs."    Dr Hilarie Fredrickson, should I request an appeal? See how she does on sulfasalazine 2 tablets qid dosing alone or increase her treatment to biologics instead?

## 2018-10-29 ENCOUNTER — Encounter: Payer: Self-pay | Admitting: *Deleted

## 2018-10-30 ENCOUNTER — Other Ambulatory Visit: Payer: Self-pay

## 2018-10-30 ENCOUNTER — Encounter: Payer: Self-pay | Admitting: Internal Medicine

## 2018-10-30 ENCOUNTER — Ambulatory Visit (INDEPENDENT_AMBULATORY_CARE_PROVIDER_SITE_OTHER): Payer: Medicare HMO | Admitting: Internal Medicine

## 2018-10-30 VITALS — Ht 68.0 in | Wt 135.0 lb

## 2018-10-30 DIAGNOSIS — K5130 Ulcerative (chronic) rectosigmoiditis without complications: Secondary | ICD-10-CM | POA: Diagnosis not present

## 2018-10-30 DIAGNOSIS — D72819 Decreased white blood cell count, unspecified: Secondary | ICD-10-CM

## 2018-10-30 DIAGNOSIS — M353 Polymyalgia rheumatica: Secondary | ICD-10-CM

## 2018-10-30 NOTE — Progress Notes (Signed)
Subjective:    Patient ID: Terri Hudson, female    DOB: 1951-04-01, 68 y.o.   MRN: [id number]  This service was provided via telemedicine.  Doximity app with A/V communication The patient was located at home The provider was located in provider's GI office. The patient did consent to this telephone visit and is aware of possible charges through their insurance for this visit.   The persons participating in this telemedicine service were the patient and I. Time spent on call: 26 minutes   HPI Terri Hudson is a 68 year old female with a history of ulcerative proctosigmoiditis (diagnosed 34 years ago in the [place]), PMR, osteoarthritis who is seen for follow-up.  Virtual visit in the setting of COVID-19.  She was last seen in the office on 09/16/2018.  Colonoscopy on 07/16/2018 revealed proctosigmoiditis involving the rectum and distal sigmoid.  Biopsies consistent with the same.  We have had a difficult time of late getting her proctitis and sigmoiditis symptoms under control.  We tried Rowasa initially and then changed to Uceris foam and sulfasalazine initially 1 g twice daily.  We have increased sulfasalazine and after Uceris phone was not helpful started her on hydrocortisone suppositories.  Hydrocortisone suppository initially was cost prohibitive but we were able to find a relatively affordable price at a local grocery store.  She preferred trying this over prednisone taper.  We also plan to continue sulfasalazine 1 g 3 times daily.  When she tried 4 times a day with sulfasalazine she got headaches.  From her colitis perspective she is 70% better.  She still having some urgency but stools are definitively less loose and more formed.  Even a few days stools have been somewhat hard.  No further rectal bleeding or blood in her stool.  The lower abdominal pain is now absent.  She is having 1-2 bowel movements per day rather than 3-5.  She has a follow-up with Dr. Estanislado Pandy on 11/21/2018.  She  continues [place] for her PMR    Review of Systems As per HPI, otherwise negative  Current Medications, Allergies, Past Medical History, Past Surgical History, Family History and Social History were reviewed in [employer] record.     Objective:   Physical Exam No physical exam, virtual visit  CBC    Component Value Date/Time   WBC 3.4 (L) 10/09/2018 1056   RBC 4.20 10/09/2018 1056   HGB 12.8 10/09/2018 1056   HCT 39.1 10/09/2018 1056   PLT 303 10/09/2018 1056   MCV 93.1 10/09/2018 1056   MCH 30.5 10/09/2018 1056   MCHC 32.7 10/09/2018 1056   RDW 11.8 10/09/2018 1056   LYMPHSABS 983 10/09/2018 1056   MONOABS 0.9 11/17/2015 1604   EOSABS 41 10/09/2018 1056   BASOSABS 51 10/09/2018 1056   CMP     Component Value Date/Time   NA 139 10/09/2018 1056   K 4.3 10/09/2018 1056   CL 108 10/09/2018 1056   CO2 24 10/09/2018 1056   GLUCOSE 95 10/09/2018 1056   BUN 13 10/09/2018 1056   CREATININE 0.88 10/09/2018 1056   CALCIUM 9.8 10/09/2018 1056   PROT 6.5 10/09/2018 1056   ALBUMIN 3.8 11/17/2015 1604   AST 24 10/09/2018 1056   ALT 25 10/09/2018 1056   ALKPHOS 81 11/17/2015 1604   BILITOT 0.7 10/09/2018 1056   GFRNONAA 69 10/09/2018 1056   GFRAA 80 10/09/2018 1056        Assessment & Plan:  68 year old female with a history  of ulcerative proctosigmoiditis (diagnosed 34 years ago in the [place]), PMR, osteoarthritis who is seen for follow-up.   1.  Ulcerative proctosigmoiditis --did not respond to Rowasa/mesalamine enemas nor sulfasalazine plus Uceris phone.  She has responded to hydrocortisone suppository plus sulfasalazine 1 g 3 times daily.  Symptoms are now improved but not absent.  We spent time today discussing steroid therapy and how I would like to get her off of this after 6 to 8 weeks.  We also discussed how steroids are not good at maintaining remission and carry significant side effects long-term.  She is aware of the side effects given her need  for prednisone in the past for PMR. We discussed maintenance therapy including immunomodulators and biologics.  We also discussed the risks in detail including the risk of infection, hepatotoxicity, leukopenia. We discussed potential options of azathioprine, adalimumab or vedolizumab.  I asked that she discuss this with Dr. Estanislado Pandy to see if any of these medications may work for her rheumatologic condition and possibly even replace her [place].  We may not be able to use [place]rav[place] with immunomodulator or biologic due to risk of toxicity.  For now: --Continue hydrocortisone suppository 25 mg nightly to complete 6 weeks of therapy --Continue sulfasalazine 1 g 3 times daily --Continue folic acid supplementation daily 1 mg --Discuss possible immunomodulator and biologic therapy with Dr. Estanislado Pandy at her visit on 11/21/2018 --Follow-up with me 1 to 2 weeks after 11/21/2018  2.  PMR --on [place] and following closely with Dr. Estanislado Pandy, see above  3.  Leukopenia --slight decrease in white cell count may be secondary to sulfasalazine therapy.  She has follow-up labs with rheumatology next week and we will evaluate white count at that time.

## 2018-10-30 NOTE — Patient Instructions (Addendum)
Continue hydrocortisone suppositories, 1 suppository 25 mg nightly to complete 6 weeks of therapy.  Continue sulfasalazine 1 g 3 times daily.  Continue folic acid 68m supplementation daily.  Discuss possible immunomodulator and biologic therapy with Dr. DEstanislado Pandyat your visit on 11/21/2018  You have been scheduled for a VIRTUAL follow up with Dr PHilarie Fredricksonon 12/03/18 at 11:30 am.   If you are age 3558or older, your body mass index should be between 23-30. Your Body mass index is 20.53 kg/m. If this is out of the aforementioned range listed, please consider follow up with your Primary Care Provider.  If you are age 3541or younger, your body mass index should be between 19-25. Your Body mass index is 20.53 kg/m. If this is out of the aformentioned range listed, please consider follow up with your Primary Care Provider.

## 2018-11-07 ENCOUNTER — Other Ambulatory Visit: Payer: Self-pay

## 2018-11-07 DIAGNOSIS — Z79899 Other long term (current) drug therapy: Secondary | ICD-10-CM

## 2018-11-07 NOTE — Progress Notes (Signed)
Office Visit Note  Patient: Terri Hudson             Date of Birth: 12-04-1950           MRN: [id number]             PCP: Maurice Small, MD Referring: Maurice Small, MD Visit Date: 11/21/2018 Occupation: @GUAROCC @  Subjective:  Ulcerative colitis flare     History of Present Illness: Terri Hudson is a 68 y.o. female with history of PMR and osteoarthritis.  She is taking Arava 10 mg 1 tablet po daily and Sulfasalazine 500 mg 2 tablets 2 times daily. She denies any recent PMR.  She denies any muscle aches, tenderness, or weakness.  She denies any joint pain or joint swelling at this time.  She has been having some stiffness in both hands but has no tenderness or inflammation at this time. She reports that she has been having recurrent ulcerative colitis flares since November.  She continues to follow-up with Dr. Hilarie Fredrickson on a regular basis.  She reports that he recommended either starting on azathioprine, Humira, or Entyvio in the future.  She is currently on a 60-day course of hydrocortisone acetate.  She will be following up with Dr. Hilarie Fredrickson next week.   Activities of Daily Living:  Patient reports morning stiffness for 0 minutes.   Patient Denies nocturnal pain.  Difficulty dressing/grooming: Denies Difficulty climbing stairs: Denies Difficulty getting out of chair: Denies Difficulty using hands for taps, buttons, cutlery, and/or writing: Reports  Review of Systems  Constitutional: Positive for fatigue.  HENT: Negative for mouth sores, mouth dryness and nose dryness.   Eyes: Negative for pain, itching and dryness.  Respiratory: Negative for shortness of breath, wheezing and difficulty breathing.   Cardiovascular: Negative for chest pain, palpitations and swelling in legs/feet.  Gastrointestinal: Positive for diarrhea. Negative for abdominal pain and constipation.  Endocrine: Negative for increased urination.  Genitourinary: Negative for painful urination and pelvic  pain.  Musculoskeletal: Positive for arthralgias and joint pain. Negative for joint swelling and morning stiffness.  Skin: Negative for rash and redness.  Allergic/Immunologic: Negative for susceptible to infections.  Neurological: Negative for dizziness, headaches, memory loss and weakness.  Hematological: Negative for bruising/bleeding tendency.  Psychiatric/Behavioral: Negative for confusion. The patient is not nervous/anxious.     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[place])     Family History  Problem Relation Age of Onset   Heart disease Mother    Osteoporosis Father    Barrett's esophagus Father    Migraines Daughter    Bowel Disease Son    Colon cancer Neg Hx    Colon polyps Neg Hx    Esophageal cancer Neg Hx    Stomach cancer Neg Hx    Rectal cancer Neg Hx    Past Surgical History:  Procedure Laterality Date   COLONOSCOPY     HAND SURGERY Left    2015 pt reports cutting her hand accidentally  and needing exploratory surgery to check for nerve damage   KNEE SURGERY Right    Social History   Social History Narrative   Not on file   Immunization History  Administered Date(s) Administered   Influenza, High Dose Seasonal PF 03/06/2018     Objective: Vital Signs: BP 136/85 (BP Location: Left Arm, Patient Position: Sitting, Cuff Size: Normal)    Pulse 93    Resp 12    Ht 5' 8.5" (1.74 m)    Wt 134 lb 6.4 oz (61 kg)    BMI 20.14 kg/m     Physical Exam Vitals signs and nursing note reviewed.  Constitutional:      Appearance: She is well-developed.  HENT:     Head: Normocephalic and atraumatic.  Eyes:     Conjunctiva/sclera: Conjunctivae normal.  Neck:     Musculoskeletal: Normal range of motion.  Cardiovascular:     Rate and Rhythm: Normal rate and regular rhythm.     Heart sounds: Normal heart sounds.  Pulmonary:     Effort: Pulmonary effort is normal.     Breath sounds: Normal breath sounds.  Abdominal:     General: Bowel sounds are normal.     Palpations: Abdomen is soft.  Lymphadenopathy:     Cervical: No cervical adenopathy.  Skin:    General: Skin is warm and dry.     Capillary Refill: Capillary refill takes less than 2 seconds.  Neurological:     Mental Status: She is alert and oriented to person, place, and time.  Psychiatric:        Behavior: Behavior normal.      Musculoskeletal Exam: C-spine, thoracic spine, and lumbar spine good ROM.  No midline spinal tenderness.  No SI joint tenderness.  Shoulder joints, elbow joints, wrist joints, MCPs, PIPs, and DIPs good ROM with no synovitis. DIP synovial thickening.  Hip joints, knee joints, ankle joints, MTPs, PIPs, and DIPs good ROM with no synovitis.  No warmth or effusion of knee joints.  No tenderness or swelling of ankle joints.  No tenderness over trochanteric bursa bilaterally.   CDAI Exam: CDAI Score: -- Patient Global: --; Provider Global: -- Swollen: --; Tender: -- Joint Exam   No joint exam has been documented for this visit   There is currently no information documented on the homunculus. Go to the Rheumatology activity and complete the homunculus joint exam.  Investigation: No additional findings.  Imaging: No results found.  Recent Labs: Lab Results  Component Value Date   WBC 3.3 (L) 11/07/2018   HGB 13.3 11/07/2018   PLT 291 11/07/2018   NA 142 11/07/2018   K 4.4 11/07/2018   CL 107 11/07/2018   CO2 25 11/07/2018    GLUCOSE 64 (L) 11/07/2018   BUN 13 11/07/2018   CREATININE 0.85 11/07/2018   BILITOT 0.8 11/07/2018   ALKPHOS 81 11/17/2015   AST 18 11/07/2018   ALT 16 11/07/2018   PROT 6.8 11/07/2018   ALBUMIN 3.8 11/17/2015   CALCIUM 10.6 (H) 11/07/2018   GFRAA 83 11/07/2018   QFTBGOLDPLUS NEGATIVE 06/18/2017    Speciality Comments: No specialty comments available.  Procedures:  No procedures performed Allergies: Patient has no known allergies.          Assessment / Plan:     Visit Diagnoses: Polymyalgia rheumatica ([place]) - She has not had any recent PMR flares.  She has no muscle aches, muscle tenderness or muscle weakness at this time.  She has no difficulty  getting up from a chair or raising her arms above her head.  She is clinically doing well on Arava 10 mg po daily and SSZ 500 mg 2 tablets BID.  She has been on [place] since February 2019.  We typically keep patients on immunosuppressive agents for 2 years.  She has been in remission.  Due to recurrent ulcerative colitis flares she has been following up with Dr. Hilarie Fredrickson on a regular basis.  There has been discussion about switching her from her current treatment regimen to Humira, Entyvio, or Imuran. Dr. Estanislado Pandy is ok with Dr. Hilarie Fredrickson proceeding with prescribing any of these medication options.  She was advised to notify us if she develops any signs or symptoms of a flare.   High risk medication use - Arava 10 mg po qd and sulfasalazine 500 mg 2 tablet BID.  Most recent CBC/CMP within normal limits except for elevated calcium and low WBC count on 11/07/2018.  Due for CBC/CMP in September and will monitor every 3 months.  Standing orders are in place.  She received the high-dose flu vaccine in October.  On prednisone therapy - She is currently on a 60-day course of hydrocoritsone acetate for a UC flare.    Abnormal laboratory test - anti-CCP+, RF-, ANA- She has no synovitis on exam.   Primary osteoarthritis of both knees - She has good ROM  with no discomfort.  No warmth or effusion of knee joints.    Age-related osteoporosis without current pathological fracture - She is on Fosamax 70 mg once weekly started in January 2020 along with vitamin D supplementation.  She can not tolerate calcium supplements to do exacerbati on of UC flares. Most recent DEXA from January 2020 showed t score of -2.5 and BMD 0.574 at right hip. T-score -2.5. Due for repeat DEXA January 2022.  Spondylosis of lumbar spine - She has good ROM with no discomfort.   Other ulcerative colitis with other complication [facility]) - She has been having recurrent ulcerative colitis flares since November 2019.  She is currently taking a 60-day course of hydrocortisone acetate.  Dr. Hilarie Fredrickson discussed azathioprine, adalimumab or vedolizumab as options for treatment of UC and PMR at last visit but wanted input from our office.  Dr. Estanislado Pandy is okay with her discontinuing her current treatment regimen and starting on one of the three options Dr. Hilarie Fredrickson suggested.  She has been in remission for almost 2 years and she been taking Arava since February 2019.  She will be following up with Dr. Hilarie Fredrickson next week to further discuss.   Orders: No orders of the defined types were placed in this encounter.  No orders of the defined types were placed in this encounter.   Face-to-face time spent with patient was 30 minutes. Greater than 50% of time was spent in counseling and coordination of care.  Follow-Up Instructions: Return in about 5 months (around 04/23/2019) for Polymyalgia Rheumatica, Osteoarthritis.   Ofilia Neas, PA-C  Note - This record has been created using Dragon software.  Chart creation errors have been sought, but may not always  have been located. Such creation errors do not reflect on  the standard of medical care.

## 2018-11-08 LAB — COMPLETE METABOLIC PANEL WITH GFR
AG Ratio: 2 (calc) (ref 1.0–2.5)
ALT: 16 U/L (ref 6–29)
AST: 18 U/L (ref 10–35)
Albumin: 4.5 g/dL (ref 3.6–5.1)
Alkaline phosphatase (APISO): 57 U/L (ref 37–153)
BUN: 13 mg/dL (ref 7–25)
CO2: 25 mmol/L (ref 20–32)
Calcium: 10.6 mg/dL — ABNORMAL HIGH (ref 8.6–10.4)
Chloride: 107 mmol/L (ref 98–110)
Creat: 0.85 mg/dL (ref 0.50–0.99)
GFR, Est African American: 83 mL/min/{1.73_m2} (ref >=60)
GFR, Est Non African American: 72 mL/min/{1.73_m2} (ref >=60)
Globulin: 2.3 g/dL (calc) (ref 1.9–3.7)
Glucose, Bld: 64 mg/dL — ABNORMAL LOW (ref 65–99)
Potassium: 4.4 mmol/L (ref 3.5–5.3)
Sodium: 142 mmol/L (ref 135–146)
Total Bilirubin: 0.8 mg/dL (ref 0.2–1.2)
Total Protein: 6.8 g/dL (ref 6.1–8.1)

## 2018-11-08 LAB — CBC WITH DIFFERENTIAL/PLATELET
Absolute Monocytes: 568 cells/uL (ref 200–950)
Basophils Absolute: 20 cells/uL (ref 0–200)
Basophils Relative: 0.6 %
Eosinophils Absolute: 0 cells/uL — ABNORMAL LOW (ref 15–500)
Eosinophils Relative: 0 %
HCT: 41.3 % (ref 35.0–45.0)
Hemoglobin: 13.3 g/dL (ref 11.7–15.5)
Lymphs Abs: 861 cells/uL (ref 850–3900)
MCH: 31.1 pg (ref 27.0–33.0)
MCHC: 32.2 g/dL (ref 32.0–36.0)
MCV: 96.5 fL (ref 80.0–100.0)
MPV: 11 fL (ref 7.5–12.5)
Monocytes Relative: 17.2 %
Neutro Abs: 1851 cells/uL (ref 1500–7800)
Neutrophils Relative %: 56.1 %
Platelets: 291 10*3/uL (ref 140–400)
RBC: 4.28 10*6/uL (ref 3.80–5.10)
RDW: 12 % (ref 11.0–15.0)
Total Lymphocyte: 26.1 %
WBC: 3.3 10*3/uL — ABNORMAL LOW (ref 3.8–10.8)

## 2018-11-21 ENCOUNTER — Encounter: Payer: Self-pay | Admitting: Physician Assistant

## 2018-11-21 ENCOUNTER — Other Ambulatory Visit: Payer: Self-pay

## 2018-11-21 ENCOUNTER — Ambulatory Visit: Payer: Medicare HMO | Admitting: Physician Assistant

## 2018-11-21 VITALS — BP 136/85 | HR 93 | Resp 12 | Ht 68.5 in | Wt 134.4 lb

## 2018-11-21 DIAGNOSIS — K51818 Other ulcerative colitis with other complication: Secondary | ICD-10-CM | POA: Diagnosis not present

## 2018-11-21 DIAGNOSIS — Z7952 Long term (current) use of systemic steroids: Secondary | ICD-10-CM | POA: Diagnosis not present

## 2018-11-21 DIAGNOSIS — M170 Bilateral primary osteoarthritis of knee: Secondary | ICD-10-CM | POA: Diagnosis not present

## 2018-11-21 DIAGNOSIS — R899 Unspecified abnormal finding in specimens from other organs, systems and tissues: Secondary | ICD-10-CM | POA: Diagnosis not present

## 2018-11-21 DIAGNOSIS — M47816 Spondylosis without myelopathy or radiculopathy, lumbar region: Secondary | ICD-10-CM | POA: Diagnosis not present

## 2018-11-21 DIAGNOSIS — Z79899 Other long term (current) drug therapy: Secondary | ICD-10-CM

## 2018-11-21 DIAGNOSIS — M353 Polymyalgia rheumatica: Secondary | ICD-10-CM | POA: Diagnosis not present

## 2018-11-21 DIAGNOSIS — M810 Age-related osteoporosis without current pathological fracture: Secondary | ICD-10-CM

## 2018-11-29 ENCOUNTER — Encounter: Payer: Self-pay | Admitting: *Deleted

## 2018-11-29 ENCOUNTER — Telehealth: Payer: Self-pay | Admitting: Internal Medicine

## 2018-11-29 NOTE — Telephone Encounter (Signed)
Patient's televisit prescreen completed.

## 2018-11-29 NOTE — Telephone Encounter (Signed)
Patient return your call

## 2018-12-03 ENCOUNTER — Ambulatory Visit (INDEPENDENT_AMBULATORY_CARE_PROVIDER_SITE_OTHER): Payer: Medicare HMO | Admitting: Internal Medicine

## 2018-12-03 ENCOUNTER — Encounter: Payer: Self-pay | Admitting: Internal Medicine

## 2018-12-03 VITALS — Ht 68.0 in | Wt 134.0 lb

## 2018-12-03 DIAGNOSIS — M353 Polymyalgia rheumatica: Secondary | ICD-10-CM | POA: Diagnosis not present

## 2018-12-03 DIAGNOSIS — D72819 Decreased white blood cell count, unspecified: Secondary | ICD-10-CM | POA: Diagnosis not present

## 2018-12-03 DIAGNOSIS — K5130 Ulcerative (chronic) rectosigmoiditis without complications: Secondary | ICD-10-CM

## 2018-12-03 NOTE — Patient Instructions (Addendum)
Complete hydrocortisone suppositories.  Please attempt to re-increase sulfasalazine to 1 g 3 times daily. You should let us know if headaches recur.  Continue daily folic acid supplementation.  Resume Canasa tomorrow at 1 g nightly.  Please follow in 6-weeks with [place]; sooner if symptoms regress after steroids are withdrawn.  Should this occur; we would need to consider biologic therapy with either Humira or Entyvio  Your provider has requested that you go to the basement level for lab work before leaving today. Press "B" on the elevator. The lab is located at the first door on the left as you exit the elevator.  If you are age 82 or older, your body mass index should be between 23-30. Your Body mass index is 20.37 kg/m. If this is out of the aforementioned range listed, please consider follow up with your Primary Care Provider.  If you are age 41 or younger, your body mass index should be between 19-25. Your Body mass index is 20.37 kg/m. If this is out of the aformentioned range listed, please consider follow up with your Primary Care Provider.

## 2018-12-03 NOTE — Addendum Note (Signed)
Addended by: Larina Bras on: 12/03/2018 05:09 PM   Modules accepted: Orders

## 2018-12-03 NOTE — Progress Notes (Signed)
Subjective:    Patient ID: Terri Hudson, female    DOB: [birth year], 68 y.o.   MRN: [id number]  HPI  This service was provided via telemedicine.   Doximity application with A/V communication. The patient was located at home at [facility] The provider was located in provider's GI office. The patient did consent to this telephone visit and is aware of possible charges through their insurance for this visit.   The persons participating in this telemedicine service were the patient and I. Time spent on call: 21 min  Terri Hudson is a 68 year old female with a history of ulcerative proctosigmoiditis (diagnosed 34 years ago in the [place]), PMR, osteoarthritis seen for follow-up.  Last seen by virtual visit on 10/30/2018.  She is seen today again by virtual visit in the setting of the COVID-19 pandemic.  At the time of her last visit she had not responded to Rowasa/mesalamine enemas nor sulfasalazine plus Uceris foam.  She did respond to hydrocortisone suppositories plus sulfasalazine 1 g 3 times a day.  At the time of her last visit we decided to continue hydrocortisone suppository to complete 6 weeks of therapy along with sulfasalazine and folic acid.  We discussed biologic therapy and she was going to discuss this with Dr. Estanislado Pandy at her rheumatology follow-up but also were going to wait to see complete response after adequate therapy.  She reports that she met with Dr. Estanislado Pandy recently and her Jolee Ewing was stopped.  They will monitor for recurrent signs of PMR.  From a colitis standpoint she is feeling better and reports her symptoms are about 90% better.  She is on sulfasalazine 1 g twice daily and has 1 additional dose of hydrocortisone suppository tonight.  The plan is to discontinue this going forward.  She has no abdominal pain and no bleeding.  Stools are occurring about 3 times per day usually in the morning with just minor urgency.  No tenesmus.  No fevers or chills.  No upper  respiratory complaints.  Previously sulfasalazine at 3 times a day was causing headaches thus the twice daily dosing  Review of Systems As per HPI, otherwise negative  Current Medications, Allergies, Past Medical History, Past Surgical History, Family History and Social History were reviewed in [employer] record.     Objective:   Physical Exam No physical exam today, virtual visit     Assessment & Plan:  68 year old female with a history of ulcerative proctosigmoiditis (diagnosed 34 years ago in the [place]), PMR, osteoarthritis seen for follow-up.   1.  Ulcerative colitis/proctitis --symptoms have significantly improved on sulfasalazine therapy and hydrocortisone suppository.  There was not initial response to initially Lialda and Canasa and then Lialda and Rowasa and then Uceris foam.  With steroids we have improved significantly.  We discussed that if symptoms regress after stopping steroids we may need to consider biologic therapy.  Jolee Ewing has been stopped by rheumatology.  We decided on the following after thorough discussion: --Complete hydrocortisone suppository with last dose tonight --Attempt to re-increase sulfasalazine to 1 g 3 times daily, she should let me know if headaches recur --Continue daily folic acid supplementation --Resume Canasa tomorrow at 1 g nightly --6-week follow-up with me but sooner if symptoms regress after steroids are withdrawn.  Should this occur we would need to consider biologic therapy with either Humira or Entyvio  2.  PMR --following with Dr. Estanislado Pandy, Jolee Ewing has been stopped.  They will monitor for recurrence of this disease.  3.  Leukopenia --mild to perhaps related to sulfasalazine.  I recommend we repeat CBC --Please place order for CBC with differential, patient will come later this week or next to have this drawn  6-week follow-up

## 2018-12-10 ENCOUNTER — Other Ambulatory Visit: Payer: Self-pay | Admitting: Internal Medicine

## 2018-12-10 ENCOUNTER — Ambulatory Visit: Payer: Medicare HMO | Admitting: Internal Medicine

## 2018-12-11 ENCOUNTER — Other Ambulatory Visit: Payer: Self-pay

## 2018-12-11 DIAGNOSIS — Z79899 Other long term (current) drug therapy: Secondary | ICD-10-CM

## 2018-12-12 LAB — CBC WITH DIFFERENTIAL/PLATELET
Absolute Monocytes: 595 cells/uL (ref 200–950)
Basophils Absolute: 38 cells/uL (ref 0–200)
Basophils Relative: 1.2 %
Eosinophils Absolute: 19 cells/uL (ref 15–500)
Eosinophils Relative: 0.6 %
HCT: 42.6 % (ref 35.0–45.0)
Hemoglobin: 13.8 g/dL (ref 11.7–15.5)
Lymphs Abs: 954 cells/uL (ref 850–3900)
MCH: 31.1 pg (ref 27.0–33.0)
MCHC: 32.4 g/dL (ref 32.0–36.0)
MCV: 95.9 fL (ref 80.0–100.0)
MPV: 11.1 fL (ref 7.5–12.5)
Monocytes Relative: 18.6 %
Neutro Abs: 1594 cells/uL (ref 1500–7800)
Neutrophils Relative %: 49.8 %
Platelets: 269 10*3/uL (ref 140–400)
RBC: 4.44 10*6/uL (ref 3.80–5.10)
RDW: 11.7 % (ref 11.0–15.0)
Total Lymphocyte: 29.8 %
WBC: 3.2 10*3/uL — ABNORMAL LOW (ref 3.8–10.8)

## 2018-12-12 LAB — COMPLETE METABOLIC PANEL WITH GFR
AG Ratio: 2 (calc) (ref 1.0–2.5)
ALT: 18 U/L (ref 6–29)
AST: 20 U/L (ref 10–35)
Albumin: 4.7 g/dL (ref 3.6–5.1)
Alkaline phosphatase (APISO): 48 U/L (ref 37–153)
BUN: 13 mg/dL (ref 7–25)
CO2: 25 mmol/L (ref 20–32)
Calcium: 10.4 mg/dL (ref 8.6–10.4)
Chloride: 106 mmol/L (ref 98–110)
Creat: 0.86 mg/dL (ref 0.50–0.99)
GFR, Est African American: 82 mL/min/{1.73_m2} (ref >=60)
GFR, Est Non African American: 71 mL/min/{1.73_m2} (ref >=60)
Globulin: 2.3 g/dL (calc) (ref 1.9–3.7)
Glucose, Bld: 94 mg/dL (ref 65–99)
Potassium: 4.1 mmol/L (ref 3.5–5.3)
Sodium: 140 mmol/L (ref 135–146)
Total Bilirubin: 0.7 mg/dL (ref 0.2–1.2)
Total Protein: 7 g/dL (ref 6.1–8.1)

## 2018-12-12 NOTE — Progress Notes (Signed)
Patient has mild neutropenia which has been stable.  Her arthritis is very well controlled. I am hesitant to decrease her sulfasalazine due to active GI disease.  She may be switching to some other immunosuppressive agents according to patient per Dr. Vena Rua plan.

## 2018-12-16 ENCOUNTER — Other Ambulatory Visit: Payer: Self-pay

## 2018-12-16 DIAGNOSIS — K5130 Ulcerative (chronic) rectosigmoiditis without complications: Secondary | ICD-10-CM

## 2018-12-30 ENCOUNTER — Other Ambulatory Visit: Payer: Self-pay | Admitting: Rheumatology

## 2018-12-30 NOTE — Telephone Encounter (Signed)
Last Visit: 11/21/18 Next Visit: 05/28/19 Labs: 12/11/18 mild neutropenia which has been stable  Okay to refill per Dr. Estanislado Pandy

## 2019-01-01 ENCOUNTER — Other Ambulatory Visit: Payer: Self-pay | Admitting: Internal Medicine

## 2019-01-09 ENCOUNTER — Encounter: Payer: Self-pay | Admitting: *Deleted

## 2019-01-14 ENCOUNTER — Ambulatory Visit: Payer: Medicare HMO | Admitting: Internal Medicine

## 2019-01-14 DIAGNOSIS — R69 Illness, unspecified: Secondary | ICD-10-CM | POA: Diagnosis not present

## 2019-01-30 NOTE — Progress Notes (Signed)
Office Visit Note  Patient: Terri Hudson             Date of Birth: 1950/09/26           MRN: [id number]             PCP: Maurice Small, MD Referring: Maurice Small, MD Visit Date: 01/31/2019 Occupation: @GUAROCC @  Subjective:  Right knee joint pain   History of Present Illness: Terri Hudson is a 68 y.o. female with history of PMR, osteoarthritis, and osteoporosis.  Patient is taking sulfasalazine 500 mg 2 tablets by mouth twice daily.  She discontinued Arava in July 2020.  She was not started on any biologics and reports that her ulcerative colitis has been better under control on sulfasalazine as monotherapy.  She continues to follow-up with Dr. Hilarie Fredrickson on a regular basis.  She reports she has been having increased stiffness in both hands and both shoulder joints since discontinuing Arava.  She presents today with right knee joint pain that started 2 weeks ago.  She denies any injuries or falls.  She denies using any changes in activity.  She states that the pain is gradually been getting worse.  She says she has been having difficulty kneeling when playing with her granddaughter due to discomfort.  She has also walking with a limp today.  She has been taking ibuprofen as needed for pain relief as well as applying ice topically.  She denies taking any prednisone recently.  She denies any signs of PMR flare.  She denies any muscle weakness or muscle tenderness at this time.  She continues take Fosamax 70 mg by mouth weekly and vitamin D daily.    Activities of Daily Living:  Patient reports morning stiffness for 30 minutes.   Patient Reports nocturnal pain.  Difficulty dressing/grooming: Reports Difficulty climbing stairs: Reports Difficulty getting out of chair: Reports Difficulty using hands for taps, buttons, cutlery, and/or writing: Denies  Review of Systems  Constitutional: Negative for fatigue.  HENT: Negative for mouth sores, mouth dryness and nose dryness.   Eyes:  Negative for pain, itching, visual disturbance and dryness.  Respiratory: Negative for cough, hemoptysis, shortness of breath, wheezing and difficulty breathing.   Cardiovascular: Negative for chest pain, palpitations, hypertension and swelling in legs/feet.  Gastrointestinal: Negative for blood in stool, constipation and diarrhea.  Endocrine: Negative for increased urination.  Genitourinary: Negative for difficulty urinating and painful urination.  Musculoskeletal: Positive for arthralgias, joint pain, joint swelling and morning stiffness. Negative for myalgias, muscle weakness, muscle tenderness and myalgias.  Skin: Negative for color change, pallor, rash, hair loss, nodules/bumps, skin tightness, ulcers and sensitivity to sunlight.  Allergic/Immunologic: Negative for susceptible to infections.  Neurological: Negative for dizziness, headaches, memory loss and weakness.  Hematological: Negative for bruising/bleeding tendency and swollen glands.  Psychiatric/Behavioral: Negative for depressed mood, confusion and sleep disturbance. The patient is not nervous/anxious.     PMFS History:  Patient Active Problem List   Diagnosis Date Noted  . High risk medication use 11/20/2017  . On prednisone therapy 11/20/2017  . Primary osteoarthritis of both knees 11/20/2017  . Spondylosis of lumbar spine 11/20/2017  . Ulcerative colitis ([place]) 11/20/2017  . Abnormal laboratory test 11/20/2017  . Polymyalgia rheumatica ([place]) 06/07/2017    Past Medical History:  Diagnosis Date  . Leukopenia   . Migraine   . Osteoarthritis   . Osteoporosis 02/12/2018  . Polymyalgia rheumatica ([place])   . Ulcerative colitis ([place])   . 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 Heart disease Mother   . Osteoporosis Father   . Barrett's esophagus Father   . Migraines Daughter   . Bowel Disease Son   . Colon cancer Neg Hx   . Colon polyps Neg Hx   . Esophageal cancer Neg  Hx   . Stomach cancer Neg Hx   . Rectal cancer Neg Hx    Past Surgical History:  Procedure Laterality Date  . COLONOSCOPY    . HAND SURGERY Left    2015 pt reports cutting her hand accidentally and needing exploratory surgery to check for nerve damage  . KNEE SURGERY Right    Social History   Social History Narrative  . Not on file   Immunization History  Administered Date(s) Administered  . Influenza, High Dose Seasonal PF 03/06/2018     Objective: Vital Signs: BP (!) 155/87 (BP Location: Left Arm, Patient Position: Sitting, Cuff Size: Normal)   Pulse 75   Resp 12   Ht 5' 8"  (1.727 m)   Wt 137 lb 9.6 oz (62.4 kg)   BMI 20.92 kg/m    Physical Exam Vitals signs and nursing note reviewed.  Constitutional:      Appearance: She is well-developed.  HENT:     Head: Normocephalic and atraumatic.  Eyes:     Conjunctiva/sclera: Conjunctivae normal.  Neck:     Musculoskeletal: Normal range of motion.  Cardiovascular:     Rate and Rhythm: Normal rate and regular rhythm.     Heart sounds: Normal heart sounds.  Pulmonary:     Effort: Pulmonary effort is normal.     Breath sounds: Normal breath sounds.  Abdominal:     General: Bowel sounds are normal.     Palpations: Abdomen is soft.  Lymphadenopathy:     Cervical: No cervical adenopathy.  Skin:    General: Skin is warm and dry.     Capillary Refill: Capillary refill takes less than 2 seconds.  Neurological:     Mental Status: She is alert and oriented to person, place, and time.  Psychiatric:        Behavior: Behavior normal.      Musculoskeletal Exam: C-spine, thoracic spine, lumbar spine good range of motion.  No midline spinal tenderness.  No SI joint tenderness.  Shoulder joints, elbow joints, wrist joints, MCPs, PIPs, DIPs good range of motion no synovitis.  She has complete fist formation bilaterally.  Hip joints, knee joints, ankle joints, MTPs and PIPs and DIPs good range of motion no synovitis.  She has right  peds anserine bursitis.  Warmth and tenderness was noted.  No tenderness or swelling of ankle joints noted.  No Achilles tendinitis or plantar fasciitis.  CDAI Exam: CDAI Score: - Patient Global: -; Provider Global: - Swollen: -; Tender: - Joint Exam   No joint exam has been documented for this visit   There is currently no information documented on the homunculus. Go to the Rheumatology activity and complete the homunculus joint exam.  Investigation: No additional findings.  Imaging: Xr Knee 3 View Right  Result Date: 01/31/2019 Mild medial compartment narrowing was noted.  Moderate patellofemoral narrowing was noted.  No chondrocalcinosis was noted. Impression: These findings are consistent with mild osteoarthritis and moderate chondromalacia patella.   Recent Labs: Lab Results  Component Value Date   WBC 3.2 (L) 12/11/2018   HGB 13.8 12/11/2018   PLT 269 12/11/2018   NA 140 12/11/2018   K  4.1 12/11/2018   CL 106 12/11/2018   CO2 25 12/11/2018   GLUCOSE 94 12/11/2018   BUN 13 12/11/2018   CREATININE 0.86 12/11/2018   BILITOT 0.7 12/11/2018   ALKPHOS 81 11/17/2015   AST 20 12/11/2018   ALT 18 12/11/2018   PROT 7.0 12/11/2018   ALBUMIN 3.8 11/17/2015   CALCIUM 10.4 12/11/2018   GFRAA 82 12/11/2018   QFTBGOLDPLUS NEGATIVE 06/18/2017    Speciality Comments: No specialty comments available.  Procedures:  No procedures performed Allergies: Patient has no known allergies.   Assessment / Plan:     Visit Diagnoses: Polymyalgia rheumatica ([place]) -She has not had any signs or symptoms of a PMR flare flare.  She has no muscle aches or muscle tenderness at this time.  She has no difficulty getting up from a chair or raising her arms above her head.  She is been experiencing some stiffness in both shoulder joints but has good range of motion with no discomfort on exam.  She has not been on prednisone recently.  She was previously taking Arava 10 mg by mouth daily but  discontinued in July 2020.  She was advised to notify us if she develop signs or symptoms of a flare.  She will follow-up in the office in 3 months.  High risk medication use - Sulfasalazine 500 mg 2 tablet by mouth twice daily.  She discontinued Arava in July 2020.  CBC and CMP were drawn on 12/11/2018.  White blood cell count was 3.2 on 12/11/2018.  We will recheck CBC and CMP today.  She will continue to require lab work every 3 months to monitor for drug toxicity.  On prednisone therapy -She is not taking prednisone at this time.  Abnormal laboratory test - anti-CCP+, RF-, ANA-She has no other clinical features of autoimmune disease at this time.  Primary osteoarthritis of both knees: She presents today with right knee joint pain that started 2 weeks ago.  She is good range of motion of both knee joints on exam.  No warmth or effusion of knee joints noted.  She does have right basilar anserine bursitis.  She had x-rays of the right knee today.  She has mild osteoarthritis in the right knee and moderate chondromalacia patella.  We discussed use of Voltaren gel topically as needed for pain relief  Pes anserine bursitis-Right: She presents today with right knee joint pain that started 2 weeks ago.  She did not have an injury or fall.  No mechanical symptoms.  She has tenderness and inflammation over the peds anserine bursa of the right knee.  X-rays of the right knee were obtained today that revealed mild osteoarthritis and moderate chondromalacia patella.  We discussed the use of Voltaren gel topically as needed for pain relief.  She declined an injection today.  She will notify us if her symptoms are persistent or worsen.  Chronic pain of right knee -She presents today with right knee joint pain.  She has been experiencing discomfort for the past 2 weeks.  She has not had any recent injuries or falls.  She has not had any change in activity.  She has no mechanical symptoms at this time.  She has  tenderness and warmth over the right peds anserine bursa.  X-rays of the right knee were obtained today which revealed mild osteoarthritis and moderate chondromalacia patella.  We discussed using Voltaren gel topically as needed for pain relief.  She declined an injection today.  She is advised to  notify us if she develops increased pain or inflammation.  Plan: XR KNEE 3 VIEW RIGHT  Age-related osteoporosis without current pathological fracture - DEXA from January 2020 showed t score of -2.5 and BMD 0.574 at right hip. T-score -2.5. Due for repeat DEXA January 2022.  She is taking Fosamax 70 mg by mouth once weekly.  She takes a vitamin D supplement daily.  Spondylosis of lumbar spine: She has no lower back pain at this time.  She has good range of motion with no discomfort.  No midline spinal tenderness.  Other ulcerative colitis with other complication ([place]) - She is followed by Dr. Hilarie Fredrickson.  She has noticed significant improvement on sulfasalazine 500 mg 2 tablets by mouth twice daily.  She seems to clinically be doing well on monotherapy.  She saw Dr. Nadean Corwin on 12/03/2018 and according to his note if she continues to have recurrent flares they will consider biologic therapy with either Humira or Entyvio.    Orders: Orders Placed This Encounter  Procedures  . XR KNEE 3 VIEW RIGHT   No orders of the defined types were placed in this encounter.   Face-to-face time spent with patient was 30 minutes. Greater than 50% of time was spent in counseling and coordination of care.  Follow-Up Instructions: Return in about 3 months (around 05/02/2019) for Polymyalgia Rheumatica, Osteoarthritis, Osteoporosis.   Ofilia Neas, PA-C  I examined and evaluated the patient with Terri Sams PA.  Patient was having discomfort in her right knee joint.  On clinical examination the findings were consistent with anserine bursitis.  The knee joint x-ray showed moderate osteoarthritis and chondromalacia patella.  Knee  joint muscle strengthening exercises were discussed.  I also offered cortisone injection which she declined.  She will use topical diclofenac gel for right now.  If her symptoms persist she will contact us.  The plan of care was discussed as noted above.  Bo Merino, MD Note - This record has been created using Editor, commissioning.  Chart creation errors have been sought, but may not always  have been located. Such creation errors do not reflect on  the standard of medical care.

## 2019-01-31 ENCOUNTER — Encounter: Payer: Self-pay | Admitting: Rheumatology

## 2019-01-31 ENCOUNTER — Ambulatory Visit: Payer: Self-pay

## 2019-01-31 ENCOUNTER — Ambulatory Visit: Payer: Medicare HMO | Admitting: Rheumatology

## 2019-01-31 ENCOUNTER — Other Ambulatory Visit: Payer: Self-pay

## 2019-01-31 VITALS — BP 155/87 | HR 75 | Resp 12 | Ht 68.0 in | Wt 137.6 lb

## 2019-01-31 DIAGNOSIS — K51818 Other ulcerative colitis with other complication: Secondary | ICD-10-CM

## 2019-01-31 DIAGNOSIS — M7050 Other bursitis of knee, unspecified knee: Secondary | ICD-10-CM

## 2019-01-31 DIAGNOSIS — M810 Age-related osteoporosis without current pathological fracture: Secondary | ICD-10-CM | POA: Diagnosis not present

## 2019-01-31 DIAGNOSIS — G8929 Other chronic pain: Secondary | ICD-10-CM

## 2019-01-31 DIAGNOSIS — R899 Unspecified abnormal finding in specimens from other organs, systems and tissues: Secondary | ICD-10-CM

## 2019-01-31 DIAGNOSIS — Z7952 Long term (current) use of systemic steroids: Secondary | ICD-10-CM | POA: Diagnosis not present

## 2019-01-31 DIAGNOSIS — M47816 Spondylosis without myelopathy or radiculopathy, lumbar region: Secondary | ICD-10-CM | POA: Diagnosis not present

## 2019-01-31 DIAGNOSIS — M353 Polymyalgia rheumatica: Secondary | ICD-10-CM

## 2019-01-31 DIAGNOSIS — Z79899 Other long term (current) drug therapy: Secondary | ICD-10-CM

## 2019-01-31 DIAGNOSIS — M170 Bilateral primary osteoarthritis of knee: Secondary | ICD-10-CM

## 2019-01-31 DIAGNOSIS — M25561 Pain in right knee: Secondary | ICD-10-CM

## 2019-01-31 NOTE — Patient Instructions (Signed)
Diclofenac (Voltaren) Gel

## 2019-02-01 LAB — COMPLETE METABOLIC PANEL WITH GFR
AG Ratio: 1.8 (calc) (ref 1.0–2.5)
ALT: 21 U/L (ref 6–29)
AST: 24 U/L (ref 10–35)
Albumin: 4.6 g/dL (ref 3.6–5.1)
Alkaline phosphatase (APISO): 49 U/L (ref 37–153)
BUN: 14 mg/dL (ref 7–25)
CO2: 23 mmol/L (ref 20–32)
Calcium: 9.8 mg/dL (ref 8.6–10.4)
Chloride: 104 mmol/L (ref 98–110)
Creat: 0.87 mg/dL (ref 0.50–0.99)
GFR, Est African American: 80 mL/min/{1.73_m2} (ref >=60)
GFR, Est Non African American: 69 mL/min/{1.73_m2} (ref >=60)
Globulin: 2.5 g/dL (calc) (ref 1.9–3.7)
Glucose, Bld: 92 mg/dL (ref 65–99)
Potassium: 4.4 mmol/L (ref 3.5–5.3)
Sodium: 138 mmol/L (ref 135–146)
Total Bilirubin: 0.6 mg/dL (ref 0.2–1.2)
Total Protein: 7.1 g/dL (ref 6.1–8.1)

## 2019-02-01 LAB — CBC WITH DIFFERENTIAL/PLATELET
Absolute Monocytes: 700 cells/uL (ref 200–950)
Basophils Absolute: 31 cells/uL (ref 0–200)
Basophils Relative: 0.7 %
Eosinophils Absolute: 31 cells/uL (ref 15–500)
Eosinophils Relative: 0.7 %
HCT: 38.4 % (ref 35.0–45.0)
Hemoglobin: 12.4 g/dL (ref 11.7–15.5)
Lymphs Abs: 1285 cells/uL (ref 850–3900)
MCH: 30.7 pg (ref 27.0–33.0)
MCHC: 32.3 g/dL (ref 32.0–36.0)
MCV: 95 fL (ref 80.0–100.0)
MPV: 11.2 fL (ref 7.5–12.5)
Monocytes Relative: 15.9 %
Neutro Abs: 2354 cells/uL (ref 1500–7800)
Neutrophils Relative %: 53.5 %
Platelets: 287 10*3/uL (ref 140–400)
RBC: 4.04 10*6/uL (ref 3.80–5.10)
RDW: 11.4 % (ref 11.0–15.0)
Total Lymphocyte: 29.2 %
WBC: 4.4 10*3/uL (ref 3.8–10.8)

## 2019-02-03 NOTE — Progress Notes (Signed)
CBC and CMP WNL

## 2019-02-13 ENCOUNTER — Ambulatory Visit (INDEPENDENT_AMBULATORY_CARE_PROVIDER_SITE_OTHER): Payer: Medicare HMO | Admitting: Internal Medicine

## 2019-02-13 ENCOUNTER — Other Ambulatory Visit: Payer: Self-pay

## 2019-02-13 DIAGNOSIS — M353 Polymyalgia rheumatica: Secondary | ICD-10-CM | POA: Diagnosis not present

## 2019-02-13 DIAGNOSIS — K5130 Ulcerative (chronic) rectosigmoiditis without complications: Secondary | ICD-10-CM

## 2019-02-13 NOTE — Patient Instructions (Signed)
Continue sulfasalazine 1 g twice daily.  Take folate 1 mg daily.  Continue Canasa suppositories, 1 g nightly.  Follow-up with Dr Hilarie Fredrickson in 6 months, Call in early February to schedule an office follow-up in April.  Let us know if you have a flare of your proctosigmoiditis symptoms prior to this and we will get you in for a sooner appointment.  If you are age 66 or older, your body mass index should be between 23-30. Your There is no height or weight on file to calculate BMI. If this is out of the aforementioned range listed, please consider follow up with your Primary Care Provider.  If you are age 60 or younger, your body mass index should be between 19-25. Your There is no height or weight on file to calculate BMI. If this is out of the aformentioned range listed, please consider follow up with your Primary Care Provider.

## 2019-02-13 NOTE — Progress Notes (Signed)
Subjective:    Patient ID: Terri Hudson, female    DOB: [birth year], 69 y.o.   MRN: [id number]  This service was provided via telemedicine.  Telephone encounter The patient was located at home The provider was located in provider's GI office. The patient did consent to this telephone visit and is aware of possible charges through their insurance for this visit.   The persons participating in this telemedicine service were the patient and I. Time spent on call: 11 min   HPI Terri Hudson is a 68 year old female with a history of ulcerative proctosigmoiditis diagnosed 35 years ago in the [place], PMR and osteoarthritis who seen for follow-up.  She is seen by virtual visit today.  She was last seen by virtual visit on 12/03/2018  She reports that she has been doing well.  She is off of the hydrocortisone suppositories.  She is continued sulfasalazine 1 g twice daily and Canasa suppository 1 g at bedtime.  She reports that her symptoms for the most part are well controlled.  She would rate her control 8-1/2 out of 10 with 10 being perfect.  She does still have some mild a.m. fecal urgency and 3-4 bowel movements each morning.  No bleeding or blood in her stool.  No abdominal pain.  No fevers or chills.  She feels like her colitis is in a very livable position.  She is dealing with some knee bursitis currently but otherwise her PMR is under good control.  She had labs recently and her white count had improved to normal at 4.4   Review of Systems As per HPI, otherwise negative  Current Medications, Allergies, Past Medical History, Past Surgical History, Family History and Social History were reviewed in [employer] record.     Objective:   Physical Exam No physical exam today, virtual visit  CBC    Component Value Date/Time   WBC 4.4 01/31/2019 1159   RBC 4.04 01/31/2019 1159   HGB 12.4 01/31/2019 1159   HCT 38.4 01/31/2019 1159   PLT 287 01/31/2019 1159   MCV 95.0 01/31/2019 1159   MCH 30.7 01/31/2019 1159   MCHC 32.3 01/31/2019 1159   RDW 11.4 01/31/2019 1159   LYMPHSABS 1,285 01/31/2019 1159   MONOABS 0.9 11/17/2015 1604   EOSABS 31 01/31/2019 1159   BASOSABS 31 01/31/2019 1159   CMP     Component Value Date/Time   NA 138 01/31/2019 1159   K 4.4 01/31/2019 1159   CL 104 01/31/2019 1159   CO2 23 01/31/2019 1159   GLUCOSE 92 01/31/2019 1159   BUN 14 01/31/2019 1159   CREATININE 0.87 01/31/2019 1159   CALCIUM 9.8 01/31/2019 1159   PROT 7.1 01/31/2019 1159   ALBUMIN 3.8 11/17/2015 1604   AST 24 01/31/2019 1159   ALT 21 01/31/2019 1159   ALKPHOS 81 11/17/2015 1604   BILITOT 0.6 01/31/2019 1159   GFRNONAA 69 01/31/2019 1159   GFRAA 80 01/31/2019 1159          Assessment & Plan:  68 year old female with a history of ulcerative proctosigmoiditis diagnosed 35 years ago in the [place], PMR and osteoarthritis who seen for follow-up.    1.  Ulcerative proctosigmoiditis --symptoms have improved significantly and she has been able to successfully be off steroids now, having previously been on hydrocortisone suppository.  She is near clinical remission and at this point I do not feel escalation of therapy is needed.  She is in agreement --Continue sulfasalazine 1  g twice daily --Supplement folate 1 mg daily --Continue Canasa suppository 1 g nightly --Follow-up with me in 6 months, I advised that she call in early February to schedule an office follow-up in April.  She should let me know if she has a flare of her proctosigmoiditis symptoms prior to this and we would get her in for a sooner appointment  2.  PMR --following with Dr. Estanislado Pandy.  3.  Leukopenia --likely related to sulfasalazine though white count has normalized.

## 2019-03-03 ENCOUNTER — Other Ambulatory Visit: Payer: Self-pay | Admitting: Internal Medicine

## 2019-03-10 DIAGNOSIS — N183 Chronic kidney disease, stage 3 unspecified: Secondary | ICD-10-CM | POA: Diagnosis not present

## 2019-03-10 DIAGNOSIS — Z23 Encounter for immunization: Secondary | ICD-10-CM | POA: Diagnosis not present

## 2019-03-10 DIAGNOSIS — Z136 Encounter for screening for cardiovascular disorders: Secondary | ICD-10-CM | POA: Diagnosis not present

## 2019-03-10 DIAGNOSIS — D72819 Decreased white blood cell count, unspecified: Secondary | ICD-10-CM | POA: Diagnosis not present

## 2019-03-10 DIAGNOSIS — M353 Polymyalgia rheumatica: Secondary | ICD-10-CM | POA: Diagnosis not present

## 2019-03-10 DIAGNOSIS — Z13220 Encounter for screening for lipoid disorders: Secondary | ICD-10-CM | POA: Diagnosis not present

## 2019-03-10 DIAGNOSIS — Z0000 Encounter for general adult medical examination without abnormal findings: Secondary | ICD-10-CM | POA: Diagnosis not present

## 2019-03-10 DIAGNOSIS — K5180 Other ulcerative colitis without complications: Secondary | ICD-10-CM | POA: Diagnosis not present

## 2019-03-10 DIAGNOSIS — M810 Age-related osteoporosis without current pathological fracture: Secondary | ICD-10-CM | POA: Diagnosis not present

## 2019-03-10 DIAGNOSIS — G43109 Migraine with aura, not intractable, without status migrainosus: Secondary | ICD-10-CM | POA: Diagnosis not present

## 2019-03-10 DIAGNOSIS — Z5181 Encounter for therapeutic drug level monitoring: Secondary | ICD-10-CM | POA: Diagnosis not present

## 2019-03-28 ENCOUNTER — Other Ambulatory Visit: Payer: Self-pay | Admitting: Internal Medicine

## 2019-04-14 DIAGNOSIS — R69 Illness, unspecified: Secondary | ICD-10-CM | POA: Diagnosis not present

## 2019-04-24 ENCOUNTER — Ambulatory Visit: Payer: Medicare HMO | Admitting: Rheumatology

## 2019-05-28 ENCOUNTER — Ambulatory Visit: Payer: Medicare HMO | Admitting: Physician Assistant

## 2019-05-30 DIAGNOSIS — B029 Zoster without complications: Secondary | ICD-10-CM | POA: Diagnosis not present

## 2019-06-26 IMAGING — DX DG CHEST 2V
2 series · 2 of 2 positions shown · non-contrast
Comparison: 11/17/2015

CLINICAL DATA: Polymyalgia rheumatica

EXAM:
CHEST  2 VIEW

[chest pa]
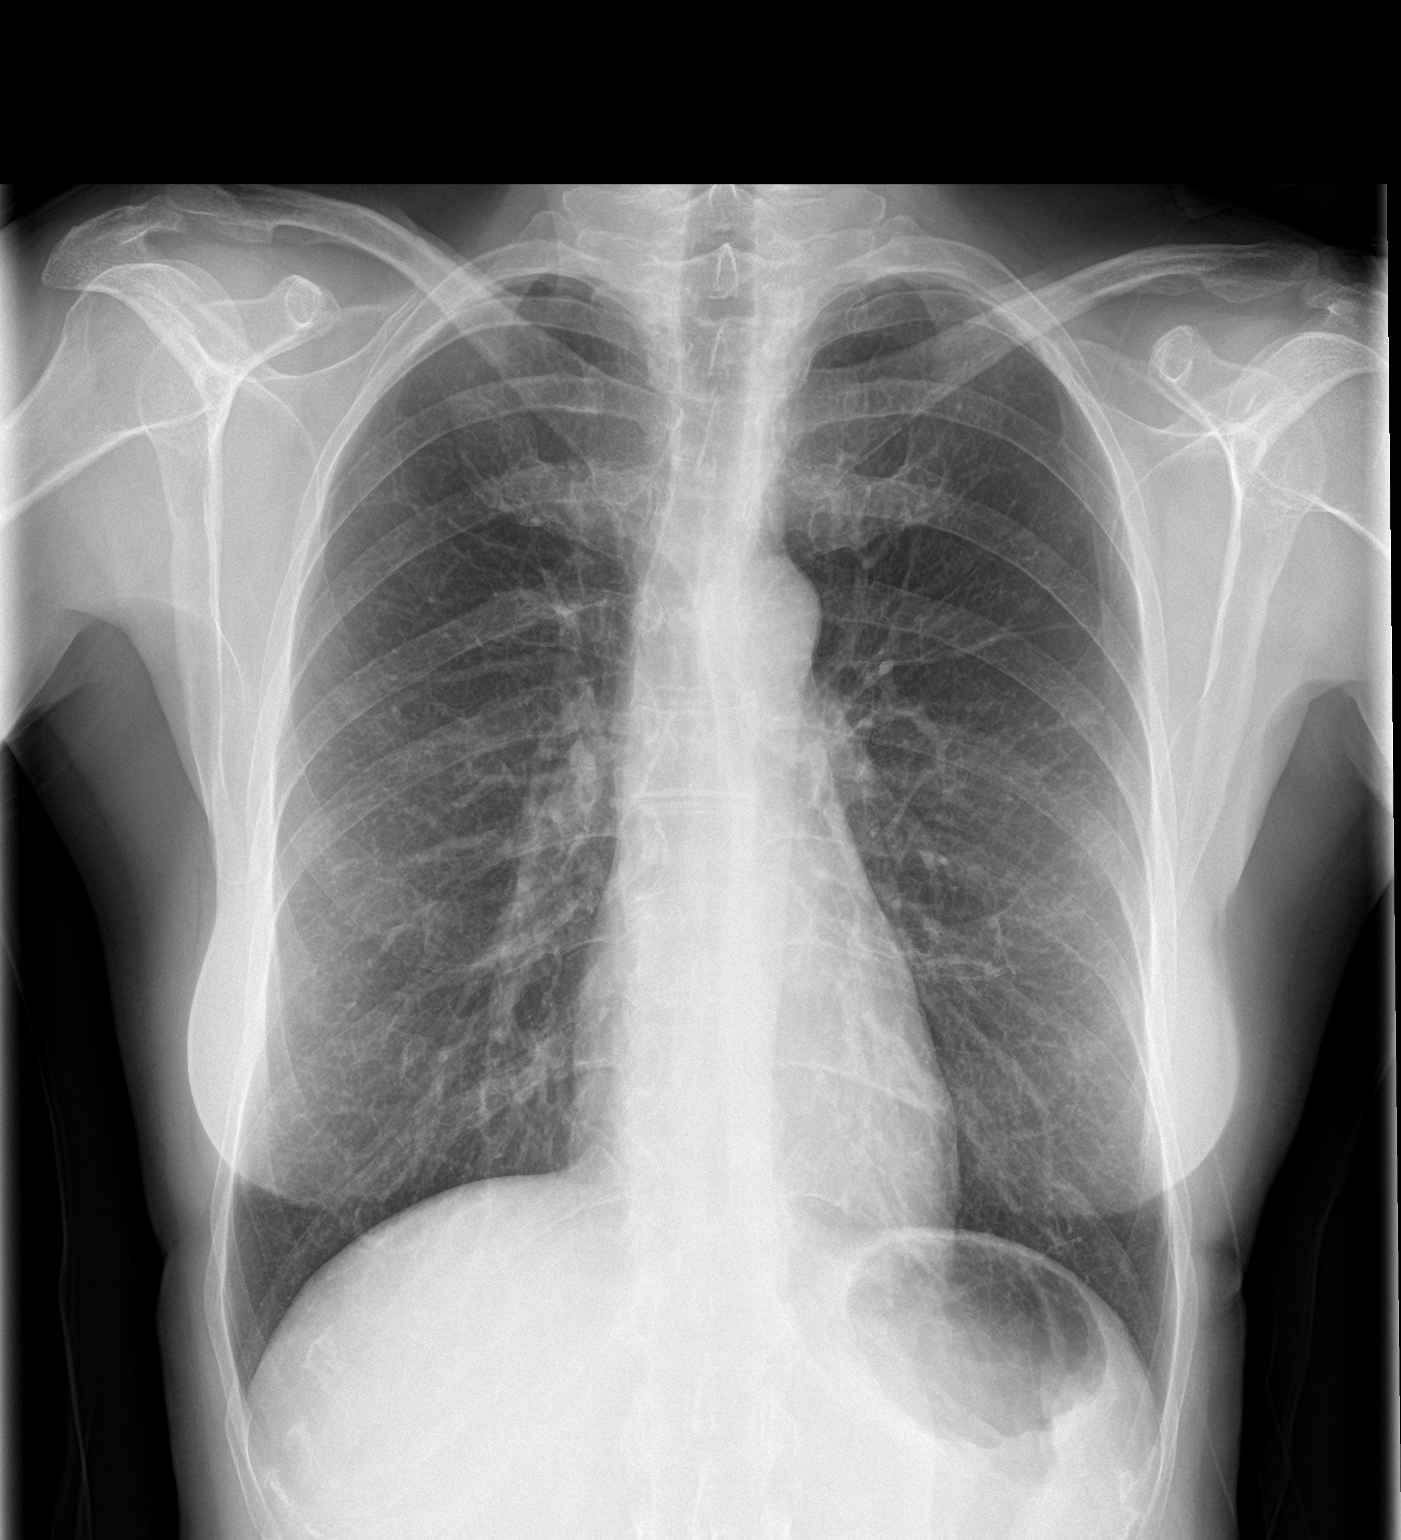

[chest lat]
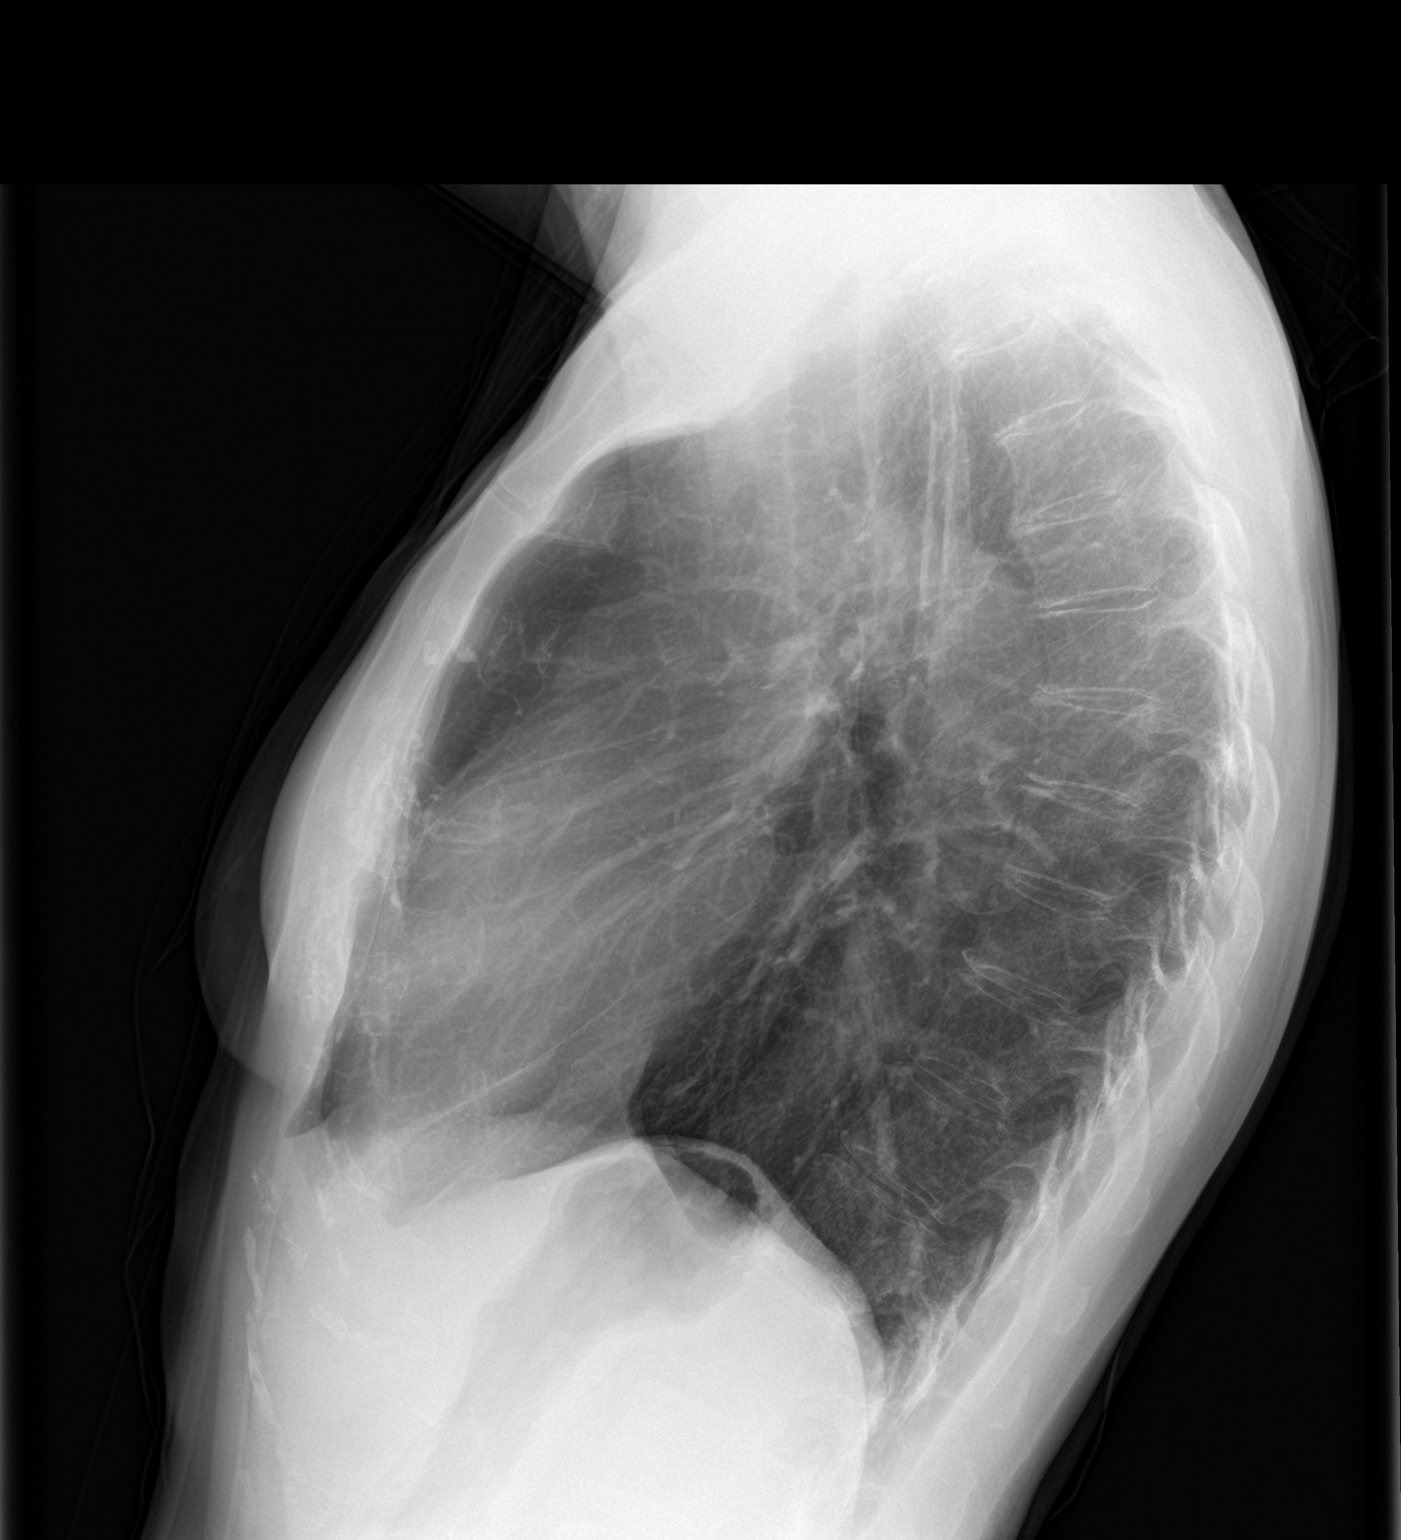

[2 of 2 positions shown; findings below may reference images not displayed]

FINDINGS: The cardiac silhouette, mediastinal and hilar contours are normal
and stable. The lungs demonstrate fairly marked hyperinflation. No
infiltrates, edema or effusions. No worrisome pulmonary lesions. The
bony thorax is intact.
IMPRESSION: Hyperinflation but no acute pulmonary findings.

## 2019-07-04 NOTE — Progress Notes (Signed)
Office Visit Note  Patient: Terri Hudson             Date of Birth: 1950/06/27           MRN: [id number]             PCP: Maurice Small, MD Referring: Maurice Small, MD Visit Date: 07/08/2019 Occupation: @GUAROCC @  Subjective:  Joint stiffness   History of Present Illness: Terri Hudson is a 69 y.o. female with history of polymyalgia rheumatica, osteoarthritis, and osteoporosis.  Patient is on sulfasalazine 500 mg 2 tablets BID.  She denies any signs or symptoms of a PMR flare.  She is not having any muscle weakness or muscle tenderness at this time.  She denies any joint pain or joint swelling. She experiences generalized joint stiffness.  She denies any difficulty getting up from a seated position or raising her arms above her head.   She denies any UC flares.  She has ongoing diarrhea but no blood in her stool recently.  She follows up with Dr. Hilarie Fredrickson on a regular basis.   She states while she was out of town she missed fosamax for 2 weeks and has not restarted, so she has missed 1-2 months.  She was previously tolerating fosamax without any side effects.    Activities of Daily Living:  Patient reports morning stiffness for 10 minutes.   Patient Denies nocturnal pain.  Difficulty dressing/grooming: Denies Difficulty climbing stairs: Denies Difficulty getting out of chair: Denies Difficulty using hands for taps, buttons, cutlery, and/or writing: Denies  Review of Systems  Constitutional: Negative for fatigue.  HENT: Negative for mouth sores, mouth dryness and nose dryness.   Eyes: Negative for itching and dryness.  Respiratory: Negative for shortness of breath and difficulty breathing.   Cardiovascular: Negative for chest pain and palpitations.  Gastrointestinal: Negative for blood in stool, constipation and diarrhea.  Endocrine: Negative for increased urination.  Genitourinary: Negative for difficulty urinating and painful urination.  Musculoskeletal: Positive for  morning stiffness. Negative for arthralgias, joint pain and joint swelling.  Skin: Negative for rash.  Allergic/Immunologic: Negative for susceptible to infections.  Neurological: Negative for dizziness, headaches, memory loss and weakness.  Hematological: Negative for bruising/bleeding tendency.  Psychiatric/Behavioral: Negative for confusion.    PMFS History:  Patient Active Problem List   Diagnosis Date Noted  . High risk medication use 11/20/2017  . On prednisone therapy 11/20/2017  . Primary osteoarthritis of both knees 11/20/2017  . Spondylosis of lumbar spine 11/20/2017  . Ulcerative colitis ([place]) 11/20/2017  . Abnormal laboratory test 11/20/2017  . Polymyalgia rheumatica ([place]) 06/07/2017    Past Medical History:  Diagnosis Date  . Leukopenia   . Migraine   . Osteoarthritis   . Osteoporosis 02/12/2018  . Polymyalgia rheumatica ([place])   . Ulcerative colitis ([place])   . 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 Heart disease Mother   . Osteoporosis Father   . Barrett's esophagus Father   . Migraines Daughter   . Bowel Disease Son   . Colon cancer Neg Hx   . Colon polyps Neg Hx   . Esophageal cancer Neg Hx   . Stomach cancer Neg Hx   . Rectal cancer Neg Hx    Past Surgical History:  Procedure Laterality Date  . COLONOSCOPY    . HAND SURGERY Left    2015 pt reports cutting her hand accidentally and needing exploratory surgery to check  for nerve damage  . KNEE SURGERY Right    Social History   Social History Narrative  . Not on file   Immunization History  Administered Date(s) Administered  . Influenza, High Dose Seasonal PF 03/06/2018     Objective: Vital Signs: BP 135/80 (BP Location: Left Arm, Patient Position: Sitting, Cuff Size: Normal)   Pulse 68   Resp 13   Ht 5' 8"  (1.727 m)   Wt 137 lb 6.4 oz (62.3 kg)   BMI 20.89 kg/m    Physical Exam Vitals and nursing note reviewed.  Constitutional:       Appearance: She is well-developed.  HENT:     Head: Normocephalic and atraumatic.  Eyes:     Conjunctiva/sclera: Conjunctivae normal.  Pulmonary:     Effort: Pulmonary effort is normal.  Abdominal:     General: Bowel sounds are normal.     Palpations: Abdomen is soft.  Musculoskeletal:     Cervical back: Normal range of motion.  Lymphadenopathy:     Cervical: No cervical adenopathy.  Skin:    General: Skin is warm and dry.     Capillary Refill: Capillary refill takes less than 2 seconds.  Neurological:     Mental Status: She is alert and oriented to person, place, and time.  Psychiatric:        Behavior: Behavior normal.      Musculoskeletal Exam: C-spine good ROM. Thoracic kyphosis noted.  Lumbar spine good ROM with no discomfort.  No midline spinal tenderness.  No SI joint tenderness.  Shoulder joints, elbow joints, wrist joints, MCPs, PIPs, DIPs good range of motion with no synovitis.  She has PIP and DIP synovial thickening consistent with osteoarthritis of both hands.  She has complete fist formation bilaterally.  Hip joints, knee joints, ankle joints, MTPs, PIPs, DIPs good range of motion with no synovitis.  No warmth or effusion of bilateral knee joints.  No tenderness or swelling of ankle joints.  No tenderness over trochanteric bursa bilaterally.  CDAI Exam: CDAI Score: -- Patient Global: --; Provider Global: -- Swollen: --; Tender: -- Joint Exam 07/08/2019   No joint exam has been documented for this visit   There is currently no information documented on the homunculus. Go to the Rheumatology activity and complete the homunculus joint exam.  Investigation: No additional findings.  Imaging: No results found.  Recent Labs: Lab Results  Component Value Date   WBC 4.4 01/31/2019   HGB 12.4 01/31/2019   PLT 287 01/31/2019   NA 138 01/31/2019   K 4.4 01/31/2019   CL 104 01/31/2019   CO2 23 01/31/2019   GLUCOSE 92 01/31/2019   BUN 14 01/31/2019   CREATININE  0.87 01/31/2019   BILITOT 0.6 01/31/2019   ALKPHOS 81 11/17/2015   AST 24 01/31/2019   ALT 21 01/31/2019   PROT 7.1 01/31/2019   ALBUMIN 3.8 11/17/2015   CALCIUM 9.8 01/31/2019   GFRAA 80 01/31/2019   QFTBGOLDPLUS NEGATIVE 06/18/2017    Speciality Comments: No specialty comments available.  Procedures:  No procedures performed Allergies: Patient has no known allergies.   Assessment / Plan:     Visit Diagnoses: Polymyalgia rheumatica ([place]): She has not had any signs or symptoms of a PMR flare recently.  No muscle tenderness or muscle weakness at this time.  She has no difficulty getting up from a seated position or raising her arms above her head.  She is clinically doing well on sulfasalazine 500 mg 2 tablets by  mouth twice daily.  She will continue on the current treatment regimen.  She is no longer taking prednisone.  She was advised to notify us if she develops any signs or symptoms of a flare.  She will follow-up in the office in 5 months.  Other ulcerative colitis with other complication ([place]) - She is followed by Dr. Hilarie Fredrickson.  She has not had any recent ulcerative colitis flares.  She has ongoing diarrhea but has not had any blood in her stool recently.  She is clinically doing well on sulfasalazine 500 mg 2 tablets by mouth twice daily prescribed by Dr. Hilarie Fredrickson.   High risk medication use - Sulfasalazine 500 mg 2 tablet by mouth twice daily.  CBC and CMP were within normal limits on 01/31/2019.  CBC and CMP will be drawn today to monitor for drug toxicity.  She will be due for lab work in May and every 3 months.  Standing orders are in place.- Plan: CBC with Differential/Platelet, COMPLETE METABOLIC PANEL WITH GFR On prednisone therapy: She is no longer taking prednisone.   Primary osteoarthritis of both knees: She has good range of motion of both knee joints.  No warmth or effusion was noted.  She has no difficulty climbing steps or getting up from a seated position.  Pes anserine  bursitis: Resolved.  Spondylosis of lumbar spine: She has good range of motion with no discomfort.  No midline spinal tenderness at this time.  No symptoms of radiculopathy.  Age-related osteoporosis without current pathological fracture -Thoracic kyphosis noted.  No midline spinal tenderness.  She has not had any recent falls or fractures.  DEXA from January 2020 showed T-score of -2.5 and BMD 0.574 at right hip. Due for repeat DEXA January 2022.  She is prescribed Fosamax 70 mg 1 tablet by mouth once weekly.  According to the patient while she was out of town she missed 2 weeks of Fosamax and has not restarted.  She states that she had a gap in therapy for 1 to 2 months.  She was advised to restart taking Fosamax 70 mg 1 tablet by mouth once weekly.  Instructions were reviewed with the patient today in the office.    Orders: Orders Placed This Encounter  Procedures  . CBC with Differential/Platelet  . COMPLETE METABOLIC PANEL WITH GFR   No orders of the defined types were placed in this encounter.    Follow-Up Instructions: Return in about 5 months (around 12/05/2019) for Polymyalgia Rheumatica, Osteoporosis.   Ofilia Neas, PA-C   I examined and evaluated the patient with Hazel Sams PA.  Patient states that her ulcerative colitis is fairly well controlled on sulfasalazine.  Her labs have been stable.  We will check her labs today.  She had no synovitis on my examination.  She is some osteoarthritic changes.  For osteoporosis she is on Fosamax.  She has been tolerating the medication well.  The plan of care was discussed as noted above.  Bo Merino, MD Note - This record has been created using Editor, commissioning.  Chart creation errors have been sought, but may not always  have been located. Such creation errors do not reflect on  the standard of medical care.

## 2019-07-08 ENCOUNTER — Ambulatory Visit: Payer: Medicare HMO | Admitting: Rheumatology

## 2019-07-08 ENCOUNTER — Telehealth: Payer: Self-pay | Admitting: Internal Medicine

## 2019-07-08 ENCOUNTER — Encounter: Payer: Self-pay | Admitting: Rheumatology

## 2019-07-08 ENCOUNTER — Other Ambulatory Visit: Payer: Self-pay

## 2019-07-08 VITALS — BP 135/80 | HR 68 | Resp 13 | Ht 68.0 in | Wt 137.4 lb

## 2019-07-08 DIAGNOSIS — M810 Age-related osteoporosis without current pathological fracture: Secondary | ICD-10-CM | POA: Diagnosis not present

## 2019-07-08 DIAGNOSIS — Z7952 Long term (current) use of systemic steroids: Secondary | ICD-10-CM

## 2019-07-08 DIAGNOSIS — M47816 Spondylosis without myelopathy or radiculopathy, lumbar region: Secondary | ICD-10-CM

## 2019-07-08 DIAGNOSIS — Z79899 Other long term (current) drug therapy: Secondary | ICD-10-CM

## 2019-07-08 DIAGNOSIS — M170 Bilateral primary osteoarthritis of knee: Secondary | ICD-10-CM | POA: Diagnosis not present

## 2019-07-08 DIAGNOSIS — M353 Polymyalgia rheumatica: Secondary | ICD-10-CM

## 2019-07-08 DIAGNOSIS — M7050 Other bursitis of knee, unspecified knee: Secondary | ICD-10-CM | POA: Diagnosis not present

## 2019-07-08 DIAGNOSIS — K51818 Other ulcerative colitis with other complication: Secondary | ICD-10-CM | POA: Diagnosis not present

## 2019-07-08 LAB — COMPLETE METABOLIC PANEL WITH GFR
AG Ratio: 1.7 (calc) (ref 1.0–2.5)
ALT: 13 U/L (ref 6–29)
AST: 16 U/L (ref 10–35)
Albumin: 4.3 g/dL (ref 3.6–5.1)
Alkaline phosphatase (APISO): 71 U/L (ref 37–153)
BUN: 17 mg/dL (ref 7–25)
CO2: 26 mmol/L (ref 20–32)
Calcium: 9.9 mg/dL (ref 8.6–10.4)
Chloride: 102 mmol/L (ref 98–110)
Creat: 0.83 mg/dL (ref 0.50–0.99)
GFR, Est African American: 85 mL/min/{1.73_m2} (ref >=60)
GFR, Est Non African American: 73 mL/min/{1.73_m2} (ref >=60)
Globulin: 2.6 g/dL (calc) (ref 1.9–3.7)
Glucose, Bld: 92 mg/dL (ref 65–99)
Potassium: 4.1 mmol/L (ref 3.5–5.3)
Sodium: 136 mmol/L (ref 135–146)
Total Bilirubin: 0.5 mg/dL (ref 0.2–1.2)
Total Protein: 6.9 g/dL (ref 6.1–8.1)

## 2019-07-08 LAB — CBC WITH DIFFERENTIAL/PLATELET
Absolute Monocytes: 995 cells/uL — ABNORMAL HIGH (ref 200–950)
Basophils Absolute: 19 cells/uL (ref 0–200)
Basophils Relative: 0.3 %
Eosinophils Absolute: 0 cells/uL — ABNORMAL LOW (ref 15–500)
Eosinophils Relative: 0 %
HCT: 37.7 % (ref 35.0–45.0)
Hemoglobin: 12.5 g/dL (ref 11.7–15.5)
Lymphs Abs: 1588 cells/uL (ref 850–3900)
MCH: 31.6 pg (ref 27.0–33.0)
MCHC: 33.2 g/dL (ref 32.0–36.0)
MCV: 95.4 fL (ref 80.0–100.0)
MPV: 10.1 fL (ref 7.5–12.5)
Monocytes Relative: 15.8 %
Neutro Abs: 3698 cells/uL (ref 1500–7800)
Neutrophils Relative %: 58.7 %
Platelets: 350 10*3/uL (ref 140–400)
RBC: 3.95 10*6/uL (ref 3.80–5.10)
RDW: 11.5 % (ref 11.0–15.0)
Total Lymphocyte: 25.2 %
WBC: 6.3 10*3/uL (ref 3.8–10.8)

## 2019-07-08 NOTE — Telephone Encounter (Signed)
Pt called back--please disregard previous message.

## 2019-07-08 NOTE — Telephone Encounter (Signed)
Noted  

## 2019-07-08 NOTE — Patient Instructions (Signed)
Standing Labs We placed an order today for your standing lab work.    Please come back and get your standing labs in May and every 3 months   We have open lab daily Monday through Thursday from 8:30-12:30 PM and 1:30-4:30 PM and Friday from 8:30-12:30 PM and 1:30-4:00 PM at the office of Dr. Shaili Deveshwar.   You may experience shorter wait times on Monday and Friday afternoons. The office is located at [address], [address], [place], [place] [address] No appointment is necessary.   Labs are drawn by [facility].  You may receive a bill from [facility] for your lab work.  If you wish to have your labs drawn at another location, please call the office 24 hours in advance to send orders.  If you have any questions regarding directions or hours of operation,  please call [phone].   Just as a reminder please drink plenty of water prior to coming for your lab work. Thanks!   

## 2019-07-09 DIAGNOSIS — R69 Illness, unspecified: Secondary | ICD-10-CM | POA: Diagnosis not present

## 2019-07-09 NOTE — Progress Notes (Signed)
Absolute eosinophils are low but WBC count is WNL.  We will continue to monitor.  CMP WNL.

## 2019-07-16 DIAGNOSIS — Z1231 Encounter for screening mammogram for malignant neoplasm of breast: Secondary | ICD-10-CM | POA: Diagnosis not present

## 2019-07-22 ENCOUNTER — Other Ambulatory Visit: Payer: Self-pay | Admitting: Internal Medicine

## 2019-08-26 ENCOUNTER — Ambulatory Visit: Payer: Medicare HMO | Admitting: Internal Medicine

## 2019-08-26 ENCOUNTER — Encounter: Payer: Self-pay | Admitting: Internal Medicine

## 2019-08-26 ENCOUNTER — Other Ambulatory Visit (INDEPENDENT_AMBULATORY_CARE_PROVIDER_SITE_OTHER): Payer: Medicare HMO

## 2019-08-26 VITALS — BP 120/80 | HR 80 | Temp 95.8°F | Ht 68.0 in | Wt 132.6 lb

## 2019-08-26 DIAGNOSIS — K639 Disease of intestine, unspecified: Secondary | ICD-10-CM | POA: Diagnosis not present

## 2019-08-26 DIAGNOSIS — K51319 Ulcerative (chronic) rectosigmoiditis with unspecified complications: Secondary | ICD-10-CM

## 2019-08-26 DIAGNOSIS — M0760 Enteropathic arthropathies, unspecified site: Secondary | ICD-10-CM | POA: Diagnosis not present

## 2019-08-26 LAB — CBC WITH DIFFERENTIAL/PLATELET
Basophils Absolute: 0 10*3/uL (ref 0.0–0.1)
Basophils Relative: 0.2 % (ref 0.0–3.0)
Eosinophils Absolute: 0 10*3/uL (ref 0.0–0.7)
Eosinophils Relative: 0 % (ref 0.0–5.0)
HCT: 41.1 % (ref 36.0–46.0)
Hemoglobin: 13.7 g/dL (ref 12.0–15.0)
Lymphocytes Relative: 15 % (ref 12.0–46.0)
Lymphs Abs: 1.1 10*3/uL (ref 0.7–4.0)
MCHC: 33.4 g/dL (ref 30.0–36.0)
MCV: 94.7 fl (ref 78.0–100.0)
Monocytes Absolute: 1 10*3/uL (ref 0.1–1.0)
Monocytes Relative: 14.1 % — ABNORMAL HIGH (ref 3.0–12.0)
Neutro Abs: 5.1 10*3/uL (ref 1.4–7.7)
Neutrophils Relative %: 70.7 % (ref 43.0–77.0)
Platelets: 414 10*3/uL — ABNORMAL HIGH (ref 150.0–400.0)
RBC: 4.34 Mil/uL (ref 3.87–5.11)
RDW: 12.7 % (ref 11.5–15.5)
WBC: 7.2 10*3/uL (ref 4.0–10.5)

## 2019-08-26 LAB — COMPREHENSIVE METABOLIC PANEL
ALT: 12 U/L (ref 0–35)
AST: 15 U/L (ref 0–37)
Albumin: 4.6 g/dL (ref 3.5–5.2)
Alkaline Phosphatase: 84 U/L (ref 39–117)
BUN: 14 mg/dL (ref 6–23)
CO2: 27 mEq/L (ref 19–32)
Calcium: 10.2 mg/dL (ref 8.4–10.5)
Chloride: 101 mEq/L (ref 96–112)
Creatinine, Ser: 0.79 mg/dL (ref 0.40–1.20)
GFR: 72.68 mL/min (ref >60.00)
Glucose, Bld: 84 mg/dL (ref 70–99)
Potassium: 4.2 mEq/L (ref 3.5–5.1)
Sodium: 135 mEq/L (ref 135–145)
Total Bilirubin: 0.5 mg/dL (ref 0.2–1.2)
Total Protein: 8.1 g/dL (ref 6.0–8.3)

## 2019-08-26 MED ORDER — FOLIC ACID 1 MG PO TABS: 1.0000 mg | ORAL_TABLET | Freq: Every day | ORAL | 1 refills | Status: DC

## 2019-08-26 MED ORDER — SULFASALAZINE 500 MG PO TABS: 1000.0000 mg | ORAL_TABLET | Freq: Two times a day (BID) | ORAL | 1 refills | Status: DC

## 2019-08-26 MED ORDER — MESALAMINE 1000 MG RE SUPP: 1000.0000 mg | Freq: Every day | RECTAL | 1 refills | Status: DC

## 2019-08-26 NOTE — Progress Notes (Signed)
Subjective:    Patient ID: Terri Hudson, female    DOB: 02-12-1951, 69 y.o.   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  She was last seen 6 months ago by virtual visit.  She is here today in person.  She reports that she has had return of her proctitis and proctosigmoiditis symptoms.  She is having frequency of stool in the a.m. with considerable urgency and some tenesmus symptoms.  No bleeding which she feels the Canasa is keeping at [place].  Most of her bowel movements occur in the morning again 4-5 times before she can feel comfortable to do anything or leave the home.  Rarely will she have an afternoon bowel movement.  She is taking sulfasalazine 1 g twice daily, folic acid and Canasa 1 g at bedtime.  Stools are loose and rarely if ever formed.  Weight is down about 10 pounds.  She has a good appetite or normal appetite for her.  No abdominal pain though some occasional cramping lower down.  She hears borborygmi.  She is having bilateral hip and shoulder pain which she has attributed to arthritis.  She has a trip planned to [place] in October and she is hopeful that her colitis symptoms can be better controlled to allow her to travel without fear of frequent bowel movement.  She did complete COVID-19 Pfizer vaccination without incident.  Her last colonoscopy showed mild to moderate colitis from the rectum to the mid sigmoid.  The remainder of the colon was normal endoscopically.  Review of Systems As per HPI, otherwise negative  Current Medications, Allergies, Past Medical History, Past Surgical History, Family History and Social History were reviewed in [employer] record.     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  1.  Ulcerative proctosigmoiditis --symptoms have recurred since we withdrew hydrocortisone suppository.  This is despite sulfasalazine 1 g twice daily and uninterrupted Canasa suppositories at bedtime.  She is having daily symptoms despite therapy and we discussed the importance of trying to reach remission without using steroids.  She certainly understands this today.  We spent considerable time today discussing escalation of therapy to get her symptoms under control as 5-ASA therapy alone is not maintaining remission.  We discussed specifically azathioprine, Humira and Entyvio today.   We also discussed the risks in great detail including the risk of infection (including reactivation of latent TB and underlying viral hepatitis), hepatotoxicity, leukopenia, pancreatitis, nausea, malignancy (specifically lymphoma), rash, demyelinating disease, PML, and even heart failure.  After this discussion she is most interested in an tibia which I think is a good choice. --Check CBC, CMP, thiopurine methyltransferase, QuantiFERON gold, viral hepatitis serologies --Continue sulfasalazine 1 g twice daily (higher doses of this led to headache and leukopenia previously).  Continue  folic acid supplementation --Canasa 1 g nightly --Begin Entyvio therapy with standard induction followed by infusion every 8 weeks thereafter  2.  Bilateral shoulder and hip pain --we discussed how her bilateral joint pain may in fact be IBD related arthropathy.  If so I would expect this to improve with improving #1.  3.  PMR --following with Dr. Estanislado Pandy with  rheumatology  4.  Leukopenia --this has resolved and was related to higher doses of sulfasalazine  29-monthfollow-up  40 minutes total spent today including patient facing time, coordination of care, reviewing medical history/procedures/pertinent radiology studies, and documentation of the encounter.

## 2019-08-26 NOTE — Patient Instructions (Signed)
We have sent the following medications to your pharmacy for you to pick up at your convenience: Canasa Sulfasalazine Folic Acid  Your provider has requested that you go to the basement level for lab work before leaving today. Press "B" on the elevator. The lab is located at the first door on the left as you exit the elevator.  If you are age 67 or older, your body mass index should be between 23-30. Your Body mass index is 20.16 kg/m. If this is out of the aforementioned range listed, please consider follow up with your Primary Care Provider.  If you are age 82 or younger, your body mass index should be between 19-25. Your Body mass index is 20.16 kg/m. If this is out of the aformentioned range listed, please consider follow up with your Primary Care Provider.   Due to recent changes in healthcare laws, you may see the results of your imaging and laboratory studies on MyChart before your provider has had a chance to review them.  We understand that in some cases there may be results that are confusing or concerning to you. Not all laboratory results come back in the same time frame and the provider may be waiting for multiple results in order to interpret others.  Please give [place] 48 hours in order for your provider to thoroughly review all the results before contacting the office for clarification of your results.

## 2019-08-27 NOTE — Progress Notes (Signed)
Referral faxed to [facility].

## 2019-08-28 LAB — HEPATITIS C ANTIBODY
Hepatitis C Ab: NONREACTIVE
SIGNAL TO CUT-OFF: 0.03 (ref <1.00)

## 2019-08-28 LAB — HEPATITIS B SURFACE ANTIGEN: Hepatitis B Surface Ag: NONREACTIVE

## 2019-08-28 LAB — QUANTIFERON-TB GOLD PLUS
Mitogen-NIL: 6.61 IU/mL
NIL: 0.15 IU/mL
QuantiFERON-TB Gold Plus: NEGATIVE
TB1-NIL: <0 IU/mL
TB2-NIL: <0 IU/mL

## 2019-08-28 LAB — HEPATITIS B SURFACE ANTIBODY,QUALITATIVE: Hep B S Ab: NONREACTIVE

## 2019-08-28 LAB — HEPATITIS B CORE ANTIBODY, TOTAL: Hep B Core Total Ab: NONREACTIVE

## 2019-08-28 LAB — HEPATITIS A ANTIBODY, TOTAL: Hepatitis A AB,Total: REACTIVE — AB

## 2019-09-01 DIAGNOSIS — H2513 Age-related nuclear cataract, bilateral: Secondary | ICD-10-CM | POA: Diagnosis not present

## 2019-09-01 DIAGNOSIS — Z0100 Encounter for examination of eyes and vision without abnormal findings: Secondary | ICD-10-CM | POA: Diagnosis not present

## 2019-09-03 ENCOUNTER — Telehealth: Payer: Self-pay

## 2019-09-03 ENCOUNTER — Other Ambulatory Visit: Payer: Self-pay

## 2019-09-03 LAB — TPMT GENETIC TEST

## 2019-09-03 NOTE — Telephone Encounter (Signed)
Referral was faxed to [facility].

## 2019-09-03 NOTE — Telephone Encounter (Signed)
-----   Message from Jerene Bears, MD sent at 08/26/2019 12:17 PM EDT ----- Lenice,I would like to start this patient on Entyvio.  This is a new start for ulcerative proctosigmoiditis.  Would try [facility] infusion until [facility] is open.Standard induction followed by every 8 weeksHopefully her insurance will coverThank you

## 2019-09-11 ENCOUNTER — Telehealth: Payer: Self-pay

## 2019-09-11 NOTE — Telephone Encounter (Signed)
Insurance will not cover Entyvio until she has taken Humira and failed or Remicade.

## 2019-09-11 NOTE — Telephone Encounter (Signed)
Received call from [facility] infusion center. With Medicare Terri Hudson is not the preferred drug. Also need to know if she has tried or failed Humira. The preferred drug for Medicare is Remicade or Inflectra. Please advise.

## 2019-09-11 NOTE — Telephone Encounter (Signed)
I spoke to patient by phone to discuss her options. After this discussion we will begin Humira (pen-based, citrate free) with standard induction followed by 40 mg every 14 days. She would like to be enrolled in the [place] program and will need education with first injections but feels that she will be fine with self injection once she learns how to administer it.

## 2019-09-11 NOTE — Telephone Encounter (Signed)
Contact at [facility] is [place], ext 101.

## 2019-09-11 NOTE — Telephone Encounter (Signed)
She has not tried infliximab or adalimumab After our discussion together about each medication and the risks/benefits we both agreed vedolizumab was the best option for her If allowed by her insurance, I recommend vedolizumab with standard induction then every 8 weeks thereafter Thanks

## 2019-09-15 NOTE — Telephone Encounter (Signed)
Referral [facility] orders [facility] to [facility] and humira complete for [facility].

## 2019-09-29 ENCOUNTER — Telehealth: Payer: Self-pay | Admitting: Internal Medicine

## 2019-09-29 NOTE — Telephone Encounter (Signed)
Allysha, can you see what is up with this copay for Humira?  We may need to contact the rep.  Weyman Rodney was not preferred by her insurance and I was previously told Humira was.  Can prior auth be done? Please investigate further Thanks JMP

## 2019-09-29 NOTE — Telephone Encounter (Signed)
Spoke with pt and she states that Dr. Hilarie Fredrickson mentioned the possibility of a pill to take and she is wondering if this is still an option for her. Please advise.

## 2019-09-29 NOTE — Telephone Encounter (Signed)
[facility] called and stated that her copay was very high even with the prior auth and they also tried to get her signed up with the abbvie assist program and she makes to much money to qualify for the assistance.

## 2019-09-29 NOTE — Telephone Encounter (Signed)
Would next recommend Remicade or the bio-similar equivalent with standard induction

## 2019-09-29 NOTE — Progress Notes (Signed)
Office Visit Note  Patient: Terri Hudson             Date of Birth: 1951-05-12           MRN: [id number]             PCP: Maurice Small, MD Referring: Maurice Small, MD Visit Date: 09/30/2019 Occupation: @GUAROCC @  Subjective:  Pain in both shoulders and both hips   History of Present Illness: Terri Hudson is a 69 y.o. female with history of PMR, ulcerative colitis, and osteoarthritis.  She is taking sulfasalazine 500 mg 2 tablets by mouth twice daily prescribed by Dr. Hilarie Fredrickson.  She is also prescribed mesalamine 100 mg suppository at bedtime.  According to the patient she continues to have recurrent ulcerative colitis flares and will be starting on azathioprine prescribed by Dr. Hilarie Fredrickson.  She continues to have diarrhea but has not had any blood in her stool recently. She has been experiencing increased pain and stiffness in both shoulders and both hip joints.  She states her discomfort is worse with range of motion.  She has been having some increased difficulty getting up from a seated position.  She continues to walk for exercise on a regular basis.  She denies any other joint pain or inflammation at this time. She is not currently taking prednisone.    Activities of Daily Living:  Patient reports joint stiffness all day Patient Denies nocturnal pain.  Difficulty dressing/grooming: Reports Difficulty climbing stairs: Denies Difficulty getting out of chair: Denies Difficulty using hands for taps, buttons, cutlery, and/or writing: Denies  Review of Systems  Constitutional: Negative for fatigue.  HENT: Negative for mouth sores, mouth dryness and nose dryness.   Eyes: Negative for pain, visual disturbance and dryness.  Respiratory: Negative for cough, hemoptysis, shortness of breath and difficulty breathing.   Cardiovascular: Negative for chest pain, palpitations, hypertension and swelling in legs/feet.  Gastrointestinal: Positive for diarrhea. Negative for blood in stool and  constipation.  Endocrine: Negative for excessive thirst and increased urination.  Genitourinary: Negative for difficulty urinating and painful urination.  Musculoskeletal: Positive for arthralgias, joint pain, muscle weakness, morning stiffness and muscle tenderness. Negative for joint swelling, myalgias and myalgias.  Skin: Negative for color change, pallor, rash, hair loss, nodules/bumps, skin tightness, ulcers and sensitivity to sunlight.  Allergic/Immunologic: Negative for susceptible to infections.  Neurological: Negative for dizziness, numbness, headaches and weakness.  Hematological: Negative for bruising/bleeding tendency and swollen glands.  Psychiatric/Behavioral: Negative for depressed mood and sleep disturbance. The patient is not nervous/anxious.     PMFS History:  Patient Active Problem List   Diagnosis Date Noted  . High risk medication use 11/20/2017  . On prednisone therapy 11/20/2017  . Primary osteoarthritis of both knees 11/20/2017  . Spondylosis of lumbar spine 11/20/2017  . Ulcerative colitis ([place]) 11/20/2017  . Abnormal laboratory test 11/20/2017  . Polymyalgia rheumatica ([place]) 06/07/2017    Past Medical History:  Diagnosis Date  . Leukopenia   . Migraine   . Osteoarthritis   . Osteoporosis 02/12/2018  . Polymyalgia rheumatica ([place])   . Ulcerative colitis ([place])   . 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 Heart disease Mother   . Osteoporosis Father   . Barrett's esophagus Father   . Migraines Daughter   . Bowel Disease Son   . Colon cancer Neg Hx   . Colon polyps Neg Hx   . Esophageal cancer Neg Hx   .  Stomach cancer Neg Hx   . Rectal cancer Neg Hx    Past Surgical History:  Procedure Laterality Date  . COLONOSCOPY    . HAND SURGERY Left    2015 pt reports cutting her hand accidentally and needing exploratory surgery to check for nerve damage  . KNEE SURGERY Right    Social History   Social  History Narrative  . Not on file   Immunization History  Administered Date(s) Administered  . Influenza, High Dose Seasonal PF 03/06/2018     Objective: Vital Signs: BP 112/64 (BP Location: Left Arm, Patient Position: Sitting, Cuff Size: Normal)   Pulse 67   Resp 14   Ht 5' 8"  (1.727 m)   Wt 136 lb 6.4 oz (61.9 kg)   BMI 20.74 kg/m    Physical Exam Vitals and nursing note reviewed.  Constitutional:      Appearance: She is well-developed.  HENT:     Head: Normocephalic and atraumatic.  Eyes:     Conjunctiva/sclera: Conjunctivae normal.  Pulmonary:     Effort: Pulmonary effort is normal.  Abdominal:     General: Bowel sounds are normal.     Palpations: Abdomen is soft.  Musculoskeletal:     Cervical back: Normal range of motion.  Lymphadenopathy:     Cervical: No cervical adenopathy.  Skin:    General: Skin is warm and dry.     Capillary Refill: Capillary refill takes less than 2 seconds.  Neurological:     Mental Status: She is alert and oriented to person, place, and time.  Psychiatric:        Behavior: Behavior normal.      Musculoskeletal Exam: C-spine limited range of motion with lateral rotation.  Thoracic and lumbar spine good ROM.  Thoracic midline spinal tenderness. She has painful range of motion of both shoulder joints on exam.  Elbow joints, wrist joints, MCPs, PIPs, DIPs good range of motion with no synovitis.  She has complete fist formation bilaterally.  Hip joints have good range of motion with discomfort bilaterally.  No tenderness over trochanter bursa bilaterally.  Knee joints and ankle joints have good range of motion with no tenderness or inflammation.  No warmth or effusion of knee joints noted.  CDAI Exam: CDAI Score: -- Patient Global: --; Provider Global: -- Swollen: --; Tender: -- Joint Exam 09/30/2019   No joint exam has been documented for this visit   There is currently no information documented on the homunculus. Go to the Rheumatology  activity and complete the homunculus joint exam.  Investigation: No additional findings.  Imaging: No results found.  Recent Labs: Lab Results  Component Value Date   WBC 7.2 08/26/2019   HGB 13.7 08/26/2019   PLT 414.0 (H) 08/26/2019   NA 135 08/26/2019   K 4.2 08/26/2019   CL 101 08/26/2019   CO2 27 08/26/2019   GLUCOSE 84 08/26/2019   BUN 14 08/26/2019   CREATININE 0.79 08/26/2019   BILITOT 0.5 08/26/2019   ALKPHOS 84 08/26/2019   AST 15 08/26/2019   ALT 12 08/26/2019   PROT 8.1 08/26/2019   ALBUMIN 4.6 08/26/2019   CALCIUM 10.2 08/26/2019   GFRAA 85 07/08/2019   QFTBGOLDPLUS NEGATIVE 08/26/2019    Speciality Comments: No specialty comments available.  Procedures:  No procedures performed Allergies: Patient has no known allergies.     Assessment / Plan:     Visit Diagnoses: Polymyalgia rheumatica ([place]) She has been experiencing increased pain and stiffness in both  shoulder joints and both hip joints over the past several weeks.  She has had an increased difficulty raising her arms above her head as well as getting up from a seated position.  She had difficulty getting up from a seated position in the office today.  She has been noticing increased muscle weakness in bilateral upper and lower extremities.  Her discomfort has been most significant with activity.  She continues to walk on a regular basis for exercise.  She is not currently taking prednisone.  We will check ESR, CK, vitamin D, and TSH today.  - Plan: Sedimentation rate, CK  Muscle weakness - She has been experiencing increased muscle weakness and discomfort in bilateral upper and lower extremities over the past several weeks.  She walks for exercise but does not perform any strength training.  She had difficulty rising from a seated position today.  We will check a CK, ESR, vitamin D, and TSH today and call her with these results. Plan: Sedimentation rate, CK  Other ulcerative colitis with other  complication ([place]) - She follows up closely with Dr. Hilarie Fredrickson. According to the patient she continues to have recurrent ulcerative colitis flares.  She has ongoing diarrhea but has not had any blood in her stool recently.  She is currently taking sulfasalazine 500 mg 2 tablets by mouth twice daily.  According to the patient she will be starting on azathioprine prescribed by Dr. Hilarie Fredrickson.  Order for TPMT was released.  On prednisone therapy: She not currently taking prednisone.    High risk medication use - Sulfasalazine 500 mg 2 tablet by mouth twice daily.  According to the patient she will be starting on Imuran prescribed by Dr. Hilarie Fredrickson.  TPMT order was released today.  - Plan: Thiopurine methyltransferase(tpmt)rbc  Primary osteoarthritis of both knees: She has good ROM with no discomfort.  No warmth or effusion of knee joints.    Pes anserine bursitis - Resolved.  Age-related osteoporosis without current pathological fracture - DEXA from January 2020 showed T-score of -2.5 and BMD 0.574 at right hip. Due for repeat DEXA January 2022.  She is taking Fosamax 70 mg 1 tablet by mouth once weekly and vitamin D 1000 units daily.    Orders: Orders Placed This Encounter  Procedures  . Sedimentation rate  . Thiopurine methyltransferase(tpmt)rbc  . CK  . TSH  . VITAMIN D 25 Hydroxy (Vit-D Deficiency, Fractures)   No orders of the defined types were placed in this encounter.     Follow-Up Instructions: Return in 4 weeks (on 10/28/2019) for Polymyalgia Rheumatica, Osteoarthritis.   Hazel Sams, PA-C  I examined and evaluated the patient with Hazel Sams PA.  Patient has been experiencing increased fatigue and increased muscle pain.  She has some difficulty getting off the chair.  She had good strength in her upper extremities on my exam.  We will obtain labs today.  The plan of care was discussed as noted above.  Bo Merino, MD  Note - This record has been created using Editor, commissioning.    Chart creation errors have been sought, but may not always  have been located. Such creation errors do not reflect on  the standard of medical care.

## 2019-09-29 NOTE — Telephone Encounter (Signed)
Pt called to inform that copay for Humira will be over $2,800, which is not affordable. She will like to consider other options. Pls call her.

## 2019-09-29 NOTE — Telephone Encounter (Signed)
Azathioprine which would be another option (not a biologic), but rather an immunomodulator This carries the same risks as biologics with a few unique risks like pancreatitis, low WBC count and elevated liver enzymes She needs TMPT enzyme activity (not the genetics) to get an actual enzymes level Once we have this we could consider AZA rather than Remicade given the insurance restrictions

## 2019-09-30 ENCOUNTER — Other Ambulatory Visit: Payer: Self-pay

## 2019-09-30 ENCOUNTER — Ambulatory Visit: Payer: Medicare HMO | Admitting: Rheumatology

## 2019-09-30 ENCOUNTER — Encounter: Payer: Self-pay | Admitting: Rheumatology

## 2019-09-30 VITALS — BP 112/64 | HR 67 | Resp 14 | Ht 68.0 in | Wt 136.4 lb

## 2019-09-30 DIAGNOSIS — K51319 Ulcerative (chronic) rectosigmoiditis with unspecified complications: Secondary | ICD-10-CM

## 2019-09-30 DIAGNOSIS — M353 Polymyalgia rheumatica: Secondary | ICD-10-CM

## 2019-09-30 DIAGNOSIS — K51818 Other ulcerative colitis with other complication: Secondary | ICD-10-CM

## 2019-09-30 DIAGNOSIS — M6281 Muscle weakness (generalized): Secondary | ICD-10-CM | POA: Diagnosis not present

## 2019-09-30 DIAGNOSIS — M810 Age-related osteoporosis without current pathological fracture: Secondary | ICD-10-CM

## 2019-09-30 DIAGNOSIS — Z79899 Other long term (current) drug therapy: Secondary | ICD-10-CM | POA: Diagnosis not present

## 2019-09-30 DIAGNOSIS — Z7952 Long term (current) use of systemic steroids: Secondary | ICD-10-CM

## 2019-09-30 DIAGNOSIS — M7050 Other bursitis of knee, unspecified knee: Secondary | ICD-10-CM | POA: Diagnosis not present

## 2019-09-30 DIAGNOSIS — M170 Bilateral primary osteoarthritis of knee: Secondary | ICD-10-CM | POA: Diagnosis not present

## 2019-09-30 LAB — VITAMIN D 25 HYDROXY (VIT D DEFICIENCY, FRACTURES): Vit D, 25-Hydroxy: 28 ng/mL — ABNORMAL LOW (ref 30–100)

## 2019-09-30 LAB — TSH: TSH: 0.9 mIU/L (ref 0.40–4.50)

## 2019-09-30 NOTE — Telephone Encounter (Signed)
Spoke with pt and she is aware. States she will come next week for the lab test. Order in epic.

## 2019-10-01 ENCOUNTER — Other Ambulatory Visit: Payer: Self-pay

## 2019-10-01 DIAGNOSIS — E559 Vitamin D deficiency, unspecified: Secondary | ICD-10-CM

## 2019-10-01 NOTE — Progress Notes (Signed)
Pt. Should take Vit D 2000 U daily . Repeat Vit D level in 6 months.

## 2019-10-08 ENCOUNTER — Telehealth: Payer: Self-pay | Admitting: Rheumatology

## 2019-10-08 ENCOUNTER — Encounter: Payer: Self-pay | Admitting: *Deleted

## 2019-10-08 NOTE — Telephone Encounter (Signed)
I called patient but could not reach her. As she was having symptoms of polymyalgia rheumatica and her ESR was elevated. We can start her on prednisone 10 mg p.o. daily. She will taper prednisone by 1 mg every month. She should repeat sed rate in 1 month. Please discuss side effects of prednisone including weight gain, elevation in blood pressure, cholesterol, blood glucose levels, and worsening of osteoporosis. Please schedule an appointment in 1 month.

## 2019-10-08 NOTE — Telephone Encounter (Signed)
Patient advised per Dr. Estanislado Pandy,  as she was having symptoms of polymyalgia rheumatica and her ESR was elevated. We can start her on prednisone 10 mg p.o. daily. She will taper prednisone by 1 mg every month. She should repeat sed rate in 1 month. Patient advised side effects of prednisone include weight gain, elevation in blood pressure, cholesterol, blood glucose levels, and worsening of osteoporosis. Patient advised to schedule appointment to be seen in one month. Patient states she has plenty of prednisone left and does not need a prescription currently and will reach out when she needs a refill. Sent information to patient via my chart as requested by patient.

## 2019-10-08 NOTE — Telephone Encounter (Signed)
Patient called requesting a return call to discuss her labwork results on 09/30/19.  Patient states she was told to take Vitamin D, but has questions regarding her other medications.

## 2019-10-08 NOTE — Telephone Encounter (Signed)
Patient states she was in the office on 09/30/2019 due to her symptoms with her PMR getting worse. Patient states she had some lab work completed. Patient states she has been advised to take Vitamin D and started taking it. Patient is still taking SSZ 2 tablets twice daily. Patient states her movements are pretty restricted and she is concerned about falling. Patient's recent sed rate was 38 and we are awaiting her TPMT results as Dr. Hilarie Fredrickson will be starting her on Azathioprine. Please advise.

## 2019-10-09 ENCOUNTER — Telehealth: Payer: Self-pay | Admitting: Rheumatology

## 2019-10-09 LAB — THIOPURINE METHYLTRANSFERASE (TPMT), RBC: Thiopurine Methyltransferase, RBC: 7 nmol/hr/mL RBC — ABNORMAL LOW

## 2019-10-09 LAB — CK: Total CK: 79 U/L (ref 29–143)

## 2019-10-09 LAB — SEDIMENTATION RATE: Sed Rate: 38 mm/h — ABNORMAL HIGH (ref 0–30)

## 2019-10-09 NOTE — Telephone Encounter (Signed)
Patient requesting a call from Dr. Estanislado Pandy, or Lovena Le to discuss medication directly. She has more questions about the medication that she would like to ask. Please call after 1:30 pm, if possible. Patient is at work until then.

## 2019-10-09 NOTE — Telephone Encounter (Signed)
I called the patient to discuss her hesitation to restart on prednisone.  I explained the medical necessity for prednisone due to the signs and symptoms she is experiencing due to PMR.  Her ESR was 38 with recent lab work.  We discussed that restarting on [place] will not control the flare at this time.  She will require prednisone to control her disease. Please send in a prescription of prednisone 10 mg by mouth daily for 1 month.  She was advised to not take the expired prescription of prednisone that she has at home. She will return to recheck ESR in 1 month.  We will determine if we can taper quicker than by 1 mg every month depending on her symptoms and ESR at that time.  She will be starting on Imuran soon prescribed by Dr. Hilarie Fredrickson, which may allow [place] to reduce the dose of prednisone quicker.  We reviewed the possible side effects of prednisone in depth.   All questions were addressed.

## 2019-10-10 MED ORDER — PREDNISONE 10 MG PO TABS: 10.0000 mg | ORAL_TABLET | Freq: Every day | ORAL | 0 refills | Status: DC

## 2019-10-10 NOTE — Progress Notes (Signed)
TPMT is 7-low metabolizer.  Please forward labs to Dr. Hilarie Fredrickson.  CK WNL.

## 2019-10-10 NOTE — Telephone Encounter (Signed)
Prescription sent to the pharmacy.

## 2019-10-14 DIAGNOSIS — R69 Illness, unspecified: Secondary | ICD-10-CM | POA: Diagnosis not present

## 2019-10-21 NOTE — Telephone Encounter (Signed)
The patient did have a TPMT enzyme and reveals that she is a low metabolizer. This does not preclude the use of azathioprine but means we will need to use a lower dose and also monitor closely for any evidence of hepatotoxicity and also leukopenia.  The patient was not approved for Humira without very high co-pay.  If patient is willing we can begin azathioprine 50 mg daily. 2 weeks after starting she will need to return for CBC and CMP Remind her that if she develops nausea, upper abdominal pain, vomiting or other side effects that she thinks may be related to azathioprine she should stop the medicine and call me immediately Please let me know if she has any questions

## 2019-10-22 NOTE — Telephone Encounter (Signed)
Spoke with pt and she is aware. Pt reports she has been started on prednisone 38m for her polymyalgia and will be on this for 166month She wants to know if it is ok for her to start the azathioprine while she is on the prednisone. Please advise.

## 2019-10-22 NOTE — Telephone Encounter (Signed)
Pt reports that her symptoms are improved, she states they are probably 90% better. She would like to hold on the azathioprine for now. Pt knows to call us back if she starts to have issues or if she finishes the taper she will contact our office. Dr. Hilarie Fredrickson aware.

## 2019-10-22 NOTE — Telephone Encounter (Signed)
It is okay to start azathioprine with prednisone but please determine if her proctitis symptoms are better on the prednisone If with prednisone and sulfasalazine her proctitis/colitis symptoms are completely controlled we may choose to delay azathioprine I will wait on her response

## 2019-11-03 ENCOUNTER — Other Ambulatory Visit: Payer: Self-pay | Admitting: Physician Assistant

## 2019-11-03 ENCOUNTER — Other Ambulatory Visit: Payer: Self-pay | Admitting: *Deleted

## 2019-11-03 DIAGNOSIS — M353 Polymyalgia rheumatica: Secondary | ICD-10-CM | POA: Diagnosis not present

## 2019-11-03 LAB — SEDIMENTATION RATE: Sed Rate: 29 mm/h (ref 0–30)

## 2019-11-03 MED ORDER — PREDNISONE 1 MG PO TABS
4.0000 mg | ORAL_TABLET | Freq: Every day | ORAL | 0 refills | Status: DC
Start: 2019-11-03 — End: 2019-12-01

## 2019-11-03 NOTE — Telephone Encounter (Signed)
Patient came by the office for labs and advised per Dr. Estanislado Pandy she may try Pepcid over the counter.

## 2019-11-03 NOTE — Telephone Encounter (Signed)
Last Visit: 09/30/2019 Next Visit: 01/01/2020  Patient is currently on prednisone 10 mg and due to taper to 9 mg on 10/05/2019. Patient states she is also having increased heartburn since being on Prednisone. Patient is requesting something to help with that. Please advise.

## 2019-11-03 NOTE — Telephone Encounter (Addendum)
Verified with Dr. Estanislado Pandy via a verbal order okay to send in Prednisone 5 mg tablets on by mouth daily #30 no refills and Prednisone 1 mg tablets take 4 tablets mouth daily #30 with no refills this is to equal 9 mg of Prednisone daily.

## 2019-11-03 NOTE — Telephone Encounter (Signed)
Okay to refill prednisone.  She may try Pepcid over-the-counter.

## 2019-11-05 NOTE — Progress Notes (Signed)
Her sed rate is normal now.  She was placed on prednisone 10 mg p.o. daily.  The plan is to taper prednisone by 1 mg p.o. monthly.  Please call in prescription for prednisone 5 mg and 1 mg so that she can take prednisone 9 mg p.o. daily for 1 month and then taper by 1 mg every month.  We will repeat sed rate at the follow-up visit.

## 2019-11-27 ENCOUNTER — Other Ambulatory Visit: Payer: Self-pay | Admitting: Rheumatology

## 2019-11-27 NOTE — Telephone Encounter (Addendum)
Last Visit: 09/30/2019 Next Visit: 01/01/2020  Patient is currently on Prednisone 9 mg and will taper to 8 mg on 12/02/2019.  Okay to refill per Dr. Estanislado Pandy

## 2019-12-01 ENCOUNTER — Other Ambulatory Visit: Payer: Self-pay | Admitting: Rheumatology

## 2019-12-01 NOTE — Telephone Encounter (Signed)
Last Visit: 09/30/2019 Next Visit: 01/01/2020  Patient is currently on Prednisone 9 mg and will taper to 8 mg on 12/02/2019.  Okay to refill Prednisone?

## 2019-12-04 DIAGNOSIS — R69 Illness, unspecified: Secondary | ICD-10-CM | POA: Diagnosis not present

## 2019-12-18 NOTE — Progress Notes (Signed)
Office Visit Note  Patient: Terri Hudson             Date of Birth: December 14, 1950           MRN: [id number]             PCP: Maurice Small, MD Referring: Maurice Small, MD Visit Date: 01/01/2020 Occupation: @GUAROCC @  Subjective:  Medication monitoring.   History of Present Illness: Terri Hudson is a 69 y.o. female with history of polymyalgia rheumatica, osteoarthritis and osteoporosis.  She has been on tapering dose of prednisone.  She is currently on prednisone 8 mg and will be decreasing to 7 mg p.o. daily tomorrow.  She denies any joint pain or joint discomfort.  She has been taking Fosamax on a weekly basis and has been tolerating it well.  She is on vitamin D 1000 units a day for vitamin D deficiency.  Activities of Daily Living:  Patient reports morning stiffness for a few minutes.   Patient Denies nocturnal pain.  Difficulty dressing/grooming: Denies Difficulty climbing stairs: Denies Difficulty getting out of chair: Denies Difficulty using hands for taps, buttons, cutlery, and/or writing: Denies  Review of Systems  Constitutional: Negative for fatigue.  HENT: Negative for mouth sores, mouth dryness and nose dryness.   Eyes: Negative for itching and dryness.  Respiratory: Negative for shortness of breath and difficulty breathing.   Cardiovascular: Negative for chest pain and palpitations.  Gastrointestinal: Positive for constipation and diarrhea. Negative for blood in stool.  Endocrine: Negative for increased urination.  Genitourinary: Negative for difficulty urinating.  Musculoskeletal: Positive for morning stiffness. Negative for arthralgias, joint pain, joint swelling, myalgias, muscle tenderness and myalgias.  Skin: Negative for color change, rash and redness.  Allergic/Immunologic: Negative for susceptible to infections.  Neurological: Positive for numbness. Negative for dizziness, headaches, memory loss and weakness.  Hematological: Negative for  bruising/bleeding tendency.  Psychiatric/Behavioral: Negative for confusion.    PMFS History:  Patient Active Problem List   Diagnosis Date Noted  . High risk medication use 11/20/2017  . On prednisone therapy 11/20/2017  . Primary osteoarthritis of both knees 11/20/2017  . Spondylosis of lumbar spine 11/20/2017  . Ulcerative colitis ([place]) 11/20/2017  . Abnormal laboratory test 11/20/2017  . Polymyalgia rheumatica ([place]) 06/07/2017    Past Medical History:  Diagnosis Date  . Leukopenia   . Migraine   . Osteoarthritis   . Osteoporosis 02/12/2018  . Polymyalgia rheumatica ([place])   . Ulcerative colitis ([place])   . 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[place])     Family History  Problem Relation Age of Onset  . Heart disease Mother   . Osteoporosis Father   . Barrett's esophagus Father   . Migraines Daughter   . Bowel Disease Son   . Colon cancer Neg Hx   . Colon polyps Neg Hx   . Esophageal cancer Neg Hx   . Stomach cancer Neg Hx   . Rectal cancer Neg Hx    Past Surgical History:  Procedure Laterality Date  . COLONOSCOPY    . HAND SURGERY Left    2015 pt reports cutting her hand accidentally and needing exploratory surgery to check for nerve damage  . KNEE SURGERY Right    Social History   Social History Narrative  . Not on file   Immunization History  Administered Date(s) Administered  . Influenza, High Dose Seasonal PF 03/06/2018  . PFIZER SARS-COV-2 Vaccination 06/04/2019, 06/25/2019     Objective: Vital Signs: BP 119/66 (BP Location: Left  Arm, Patient Position: Sitting, Cuff Size: Normal)   Pulse 85   Resp 14   Ht 5' 8"  (1.727 m)   Wt 139 lb 12.8 oz (63.4 kg)   BMI 21.26 kg/m    Physical Exam Vitals and nursing note reviewed.  Constitutional:      Appearance: She is well-developed.  HENT:     Head: Normocephalic and atraumatic.  Eyes:     Conjunctiva/sclera: Conjunctivae normal.  Cardiovascular:     Rate and Rhythm: Normal rate and regular rhythm.      Heart sounds: Normal heart sounds.  Pulmonary:     Effort: Pulmonary effort is normal.     Breath sounds: Normal breath sounds.  Abdominal:     General: Bowel sounds are normal.     Palpations: Abdomen is soft.  Musculoskeletal:     Cervical back: Normal range of motion.  Lymphadenopathy:     Cervical: No cervical adenopathy.  Skin:    General: Skin is warm and dry.     Capillary Refill: Capillary refill takes less than 2 seconds.  Neurological:     Mental Status: She is alert and oriented to person, place, and time.  Psychiatric:        Behavior: Behavior normal.      Musculoskeletal Exam: C-spine thoracic and lumbar spine were in good range of motion.  Shoulder joints, elbow joints, wrist joints with good range of motion.  She has some DIP thickening.  Hip joints, knee joints, ankles, MTPs with good range of motion with no synovitis. She had no muscular weakness or tenderness on examination. CDAI Exam: CDAI Score: -- Patient Global: --; Provider Global: -- Swollen: --; Tender: -- Joint Exam 01/01/2020   No joint exam has been documented for this visit   There is currently no information documented on the homunculus. Go to the Rheumatology activity and complete the homunculus joint exam.  Investigation: No additional findings.  Imaging: No results found.  Recent Labs: Lab Results  Component Value Date   WBC 7.2 08/26/2019   HGB 13.7 08/26/2019   PLT 414.0 (H) 08/26/2019   NA 135 08/26/2019   K 4.2 08/26/2019   CL 101 08/26/2019   CO2 27 08/26/2019   GLUCOSE 84 08/26/2019   BUN 14 08/26/2019   CREATININE 0.79 08/26/2019   BILITOT 0.5 08/26/2019   ALKPHOS 84 08/26/2019   AST 15 08/26/2019   ALT 12 08/26/2019   PROT 8.1 08/26/2019   ALBUMIN 4.6 08/26/2019   CALCIUM 10.2 08/26/2019   GFRAA 85 07/08/2019   QFTBGOLDPLUS NEGATIVE 08/26/2019    Speciality Comments: No specialty comments available.  Procedures:  No procedures performed Allergies: Patient has  no known allergies.   Assessment / Plan:     Visit Diagnoses: Polymyalgia rheumatica (HCC)-she was treated for PMR in the past with prednisone and leflunomide.  She did really well and then had a flare of PMR.  She is currently on prednisone 8 mg p.o. daily.  She will be decreasing it to 7 mg p.o. daily tomorrow.  At this point she does not want to use any other DMARDs.  She has been taking sulfasalazine for ulcerative colitis.  Muscle weakness-she has no muscular weakness or tenderness today.  Other ulcerative colitis with other complication ([place]) - She follows up closely with Dr. Hilarie Fredrickson  On prednisone therapy - She not currently taking prednisone 8 mg p.o. daily.  High risk medication use - Sulfasalazine 500 mg 2 tablet by mouth twice daily.  According to the patient she will be starting on Imuran prescribed by Dr. Hilarie Fredrickson - Plan: CBC with Differential/Platelet, COMPLETE METABOLIC PANEL WITH GFR today and every 3 months to monitor for drug toxicity.  Primary osteoarthritis of both knees-doing well without discomfort.  Age-related osteoporosis without current pathological fracture - DEXA from January 2020 showed T-score of -2.5 and BMD 0.574 at right hip.Fosamax 70 mg 1 tablet by mouth once weekly and vitamin D 1000 units daily. - Plan: VITAMIN D 25 Hydroxy (Vit-D Deficiency, Fractures)  Vitamin D deficiency - Plan: VITAMIN D 25 Hydroxy (Vit-D Deficiency, Fractures) she is currently on vitamin D 1000 units.  She had COVID-19 vaccination.  Use of mask, social distancing and hand hygiene was emphasized.  She needs a booster vaccine.  Detailed counseling was provided.  Information was also placed in the AVS.  She is also informed about monoclonal antibody infusion in case she contracts COVID-19 infection.  Orders: Orders Placed This Encounter  Procedures  . CBC with Differential/Platelet  . COMPLETE METABOLIC PANEL WITH GFR  . VITAMIN D 25 Hydroxy (Vit-D Deficiency, Fractures)   No orders  of the defined types were placed in this encounter.    Follow-Up Instructions: Return in about 3 months (around 04/02/2020) for Polymyalgia rheumatica.   Bo Merino, MD  Note - This record has been created using Editor, commissioning.  Chart creation errors have been sought, but may not always  have been located. Such creation errors do not reflect on  the standard of medical care.

## 2019-12-20 ENCOUNTER — Other Ambulatory Visit: Payer: Self-pay | Admitting: Rheumatology

## 2019-12-22 DIAGNOSIS — G43909 Migraine, unspecified, not intractable, without status migrainosus: Secondary | ICD-10-CM | POA: Diagnosis not present

## 2019-12-22 DIAGNOSIS — Z7952 Long term (current) use of systemic steroids: Secondary | ICD-10-CM | POA: Diagnosis not present

## 2019-12-22 DIAGNOSIS — M810 Age-related osteoporosis without current pathological fracture: Secondary | ICD-10-CM | POA: Diagnosis not present

## 2019-12-22 DIAGNOSIS — M353 Polymyalgia rheumatica: Secondary | ICD-10-CM | POA: Diagnosis not present

## 2019-12-22 DIAGNOSIS — Z8249 Family history of ischemic heart disease and other diseases of the circulatory system: Secondary | ICD-10-CM | POA: Diagnosis not present

## 2019-12-22 DIAGNOSIS — Z008 Encounter for other general examination: Secondary | ICD-10-CM | POA: Diagnosis not present

## 2019-12-22 DIAGNOSIS — Z809 Family history of malignant neoplasm, unspecified: Secondary | ICD-10-CM | POA: Diagnosis not present

## 2019-12-22 DIAGNOSIS — K5190 Ulcerative colitis, unspecified, without complications: Secondary | ICD-10-CM | POA: Diagnosis not present

## 2019-12-22 DIAGNOSIS — Z7983 Long term (current) use of bisphosphonates: Secondary | ICD-10-CM | POA: Diagnosis not present

## 2019-12-22 NOTE — Telephone Encounter (Addendum)
Last Visit: 09/30/2019 Next Visit: 01/01/2020  Patient is currently on Prednisone 8 mg until 01/02/2020.    Okay to refill per Dr. Estanislado Pandy

## 2019-12-28 ENCOUNTER — Other Ambulatory Visit: Payer: Self-pay | Admitting: Physician Assistant

## 2019-12-29 NOTE — Telephone Encounter (Signed)
Last Visit: 09/30/2019 Next Visit: 01/01/2020  Patient is currently on Prednisone 8 mg until 01/02/2020 then tapering to 7 mg.    Okay to refill prednisone?

## 2020-01-01 ENCOUNTER — Other Ambulatory Visit: Payer: Self-pay

## 2020-01-01 ENCOUNTER — Encounter: Payer: Self-pay | Admitting: Rheumatology

## 2020-01-01 ENCOUNTER — Ambulatory Visit (INDEPENDENT_AMBULATORY_CARE_PROVIDER_SITE_OTHER): Payer: Medicare HMO | Admitting: Rheumatology

## 2020-01-01 VITALS — BP 119/66 | HR 85 | Resp 14 | Ht 68.0 in | Wt 139.8 lb

## 2020-01-01 DIAGNOSIS — M353 Polymyalgia rheumatica: Secondary | ICD-10-CM | POA: Diagnosis not present

## 2020-01-01 DIAGNOSIS — M170 Bilateral primary osteoarthritis of knee: Secondary | ICD-10-CM | POA: Diagnosis not present

## 2020-01-01 DIAGNOSIS — M810 Age-related osteoporosis without current pathological fracture: Secondary | ICD-10-CM

## 2020-01-01 DIAGNOSIS — Z79899 Other long term (current) drug therapy: Secondary | ICD-10-CM

## 2020-01-01 DIAGNOSIS — K51818 Other ulcerative colitis with other complication: Secondary | ICD-10-CM

## 2020-01-01 DIAGNOSIS — Z7952 Long term (current) use of systemic steroids: Secondary | ICD-10-CM | POA: Diagnosis not present

## 2020-01-01 DIAGNOSIS — E559 Vitamin D deficiency, unspecified: Secondary | ICD-10-CM

## 2020-01-01 DIAGNOSIS — M6281 Muscle weakness (generalized): Secondary | ICD-10-CM

## 2020-01-01 LAB — CBC WITH DIFFERENTIAL/PLATELET
Absolute Monocytes: 561 cells/uL (ref 200–950)
Basophils Absolute: 24 cells/uL (ref 0–200)
Basophils Relative: 0.3 %
Eosinophils Absolute: 0 cells/uL — ABNORMAL LOW (ref 15–500)
Eosinophils Relative: 0 %
HCT: 39.6 % (ref 35.0–45.0)
Hemoglobin: 13.1 g/dL (ref 11.7–15.5)
Lymphs Abs: 1146 cells/uL (ref 850–3900)
MCH: 31.3 pg (ref 27.0–33.0)
MCHC: 33.1 g/dL (ref 32.0–36.0)
MCV: 94.7 fL (ref 80.0–100.0)
MPV: 10.3 fL (ref 7.5–12.5)
Monocytes Relative: 7.1 %
Neutro Abs: 6170 cells/uL (ref 1500–7800)
Neutrophils Relative %: 78.1 %
Platelets: 361 10*3/uL (ref 140–400)
RBC: 4.18 10*6/uL (ref 3.80–5.10)
RDW: 12.4 % (ref 11.0–15.0)
Total Lymphocyte: 14.5 %
WBC: 7.9 10*3/uL (ref 3.8–10.8)

## 2020-01-01 LAB — COMPLETE METABOLIC PANEL WITH GFR
AG Ratio: 1.6 (calc) (ref 1.0–2.5)
ALT: 16 U/L (ref 6–29)
AST: 21 U/L (ref 10–35)
Albumin: 4.4 g/dL (ref 3.6–5.1)
Alkaline phosphatase (APISO): 60 U/L (ref 37–153)
BUN: 15 mg/dL (ref 7–25)
CO2: 28 mmol/L (ref 20–32)
Calcium: 10.1 mg/dL (ref 8.6–10.4)
Chloride: 101 mmol/L (ref 98–110)
Creat: 0.9 mg/dL (ref 0.50–0.99)
GFR, Est African American: 77 mL/min/{1.73_m2} (ref >=60)
GFR, Est Non African American: 67 mL/min/{1.73_m2} (ref >=60)
Globulin: 2.8 g/dL (calc) (ref 1.9–3.7)
Glucose, Bld: 132 mg/dL — ABNORMAL HIGH (ref 65–99)
Potassium: 4.1 mmol/L (ref 3.5–5.3)
Sodium: 136 mmol/L (ref 135–146)
Total Bilirubin: 0.6 mg/dL (ref 0.2–1.2)
Total Protein: 7.2 g/dL (ref 6.1–8.1)

## 2020-01-01 LAB — VITAMIN D 25 HYDROXY (VIT D DEFICIENCY, FRACTURES): Vit D, 25-Hydroxy: 27 ng/mL — ABNORMAL LOW (ref 30–100)

## 2020-01-01 NOTE — Patient Instructions (Signed)

## 2020-01-02 ENCOUNTER — Other Ambulatory Visit: Payer: Self-pay | Admitting: *Deleted

## 2020-01-02 NOTE — Progress Notes (Signed)
CBC and CMP are normal.  Glucose is mildly elevated probably not a fasting sample.  Vitamin D is low.  Please call in vitamin D 50,000 units once a week for 3 months.  Repeat vitamin D in 3 months.

## 2020-01-02 NOTE — Telephone Encounter (Signed)
-----   Message from Shona Needles, [place] sent at 01/02/2020  1:08 PM EDT ----- Please send in RX for [facility] on [address] for Vit D ----- Message ----- From: Bo Merino, MD Sent: 01/02/2020   8:04 AM EDT To: [facility] Clinical  CBC and CMP are normal.  Glucose is mildly elevated probably not a fasting sample.  Vitamin D is low.  Please call in vitamin D 50,000 units once a week for 3 months.  Repeat vitamin D in 3 months.

## 2020-01-05 MED ORDER — VITAMIN D (ERGOCALCIFEROL) 1.25 MG (50000 UNIT) PO CAPS: 50000.0000 [IU] | ORAL_CAPSULE | ORAL | 0 refills | Status: DC

## 2020-01-15 ENCOUNTER — Other Ambulatory Visit: Payer: Self-pay | Admitting: Rheumatology

## 2020-01-15 NOTE — Telephone Encounter (Signed)
Last Visit: 01/01/2020 Next Visit: 03/25/2020  Current Dose per office note on 01/01/2020:currently on prednisone 8 mg p.o. daily.  decreasing it to 7 mg p.o. daily tomorrow   Okay to refill per Dr. Estanislado Pandy

## 2020-01-28 DIAGNOSIS — R69 Illness, unspecified: Secondary | ICD-10-CM | POA: Diagnosis not present

## 2020-02-04 ENCOUNTER — Other Ambulatory Visit: Payer: Self-pay | Admitting: Rheumatology

## 2020-02-04 NOTE — Telephone Encounter (Signed)
Last Visit: 01/01/2020 Next Visit: 03/25/2020   Attempted to contact the patient to verify dose of prednisone. Left message for patient to call the office.

## 2020-02-10 ENCOUNTER — Other Ambulatory Visit: Payer: Self-pay | Admitting: Rheumatology

## 2020-02-10 NOTE — Telephone Encounter (Addendum)
Last Visit: 01/01/2020 Next Visit: 03/25/2020  Current Dose Per Patient: 5 mg by mouth every day with breakfast

## 2020-03-04 ENCOUNTER — Other Ambulatory Visit: Payer: Self-pay | Admitting: Rheumatology

## 2020-03-04 NOTE — Telephone Encounter (Signed)
Last Visit: 01/01/2020 Next Visit: 03/25/2020  Attempted to contact the patient and left message for patient to call the office. Need to verify what dose patient is on in her taper.

## 2020-03-05 DIAGNOSIS — M810 Age-related osteoporosis without current pathological fracture: Secondary | ICD-10-CM | POA: Diagnosis not present

## 2020-03-05 DIAGNOSIS — R5382 Chronic fatigue, unspecified: Secondary | ICD-10-CM | POA: Diagnosis not present

## 2020-03-05 DIAGNOSIS — Z136 Encounter for screening for cardiovascular disorders: Secondary | ICD-10-CM | POA: Diagnosis not present

## 2020-03-05 DIAGNOSIS — G629 Polyneuropathy, unspecified: Secondary | ICD-10-CM | POA: Diagnosis not present

## 2020-03-05 DIAGNOSIS — E538 Deficiency of other specified B group vitamins: Secondary | ICD-10-CM | POA: Diagnosis not present

## 2020-03-05 DIAGNOSIS — Z5181 Encounter for therapeutic drug level monitoring: Secondary | ICD-10-CM | POA: Diagnosis not present

## 2020-03-05 NOTE — Telephone Encounter (Signed)
Spoke with patient and she states she started Prednisone 5 mg today and will not need the 1 mg Prednisone until she tapers to 4 mg next month. Patient will notify the office for refill and if she needs refill on 5 mg tablets.

## 2020-03-07 ENCOUNTER — Other Ambulatory Visit: Payer: Self-pay | Admitting: Internal Medicine

## 2020-03-07 ENCOUNTER — Other Ambulatory Visit: Payer: Self-pay | Admitting: Rheumatology

## 2020-03-08 NOTE — Telephone Encounter (Signed)
Last Visit: 01/01/2020 Next Visit: 03/25/2020  Patient currently on Prednisone 5 mg daily  Okay to refill per Dr. Estanislado Pandy

## 2020-03-12 NOTE — Progress Notes (Signed)
Office Visit Note  Patient: Terri Hudson             Date of Birth: 07-15-1950           MRN: [id number]             PCP: Maurice Small, MD Referring: Maurice Small, MD Visit Date: 03/25/2020 Occupation: @GUAROCC @  Subjective:  Medication Management    History of Present Illness: Terri Hudson is a 69 y.o. female with history of polymyalgia rheumatica and positive anti-CCP antibody.  She states that this is her fourth flare of polymyalgia rheumatica since 2015.  She states she did really well when she was on leflunomide.  She came off prednisone.  Once she stopped leflunomide her symptoms flare.  She did not want to take methotrexate as it interferes with drinking alcohol.  He has been doing really well on prednisone 5 mg p.o. daily currently.  She will be tapering it down to 4 mg p.o. daily next month.  She denies any muscle pain or muscle weakness.  There is no joint pain or joint swelling.  Activities of Daily Living:  Patient reports morning stiffness for 0 minutes.   Patient Denies nocturnal pain.  Difficulty dressing/grooming: Denies Difficulty climbing stairs: Denies Difficulty getting out of chair: Denies Difficulty using hands for taps, buttons, cutlery, and/or writing: Denies  Review of Systems  Constitutional: Negative for fatigue.  HENT: Negative for mouth sores, mouth dryness and nose dryness.   Eyes: Negative for pain, itching and dryness.  Respiratory: Negative for shortness of breath and difficulty breathing.   Cardiovascular: Negative for chest pain and palpitations.  Gastrointestinal: Negative for blood in stool, constipation and diarrhea.  Endocrine: Negative for increased urination.  Genitourinary: Negative for difficulty urinating.  Musculoskeletal: Negative for arthralgias, joint pain, joint swelling, myalgias, morning stiffness, muscle tenderness and myalgias.  Skin: Negative for color change, rash and redness.  Allergic/Immunologic: Negative for  susceptible to infections.  Neurological: Negative for dizziness, numbness, headaches, memory loss and weakness.  Hematological: Negative for bruising/bleeding tendency.  Psychiatric/Behavioral: Negative for confusion.    PMFS History:  Patient Active Problem List   Diagnosis Date Noted  . High risk medication use 11/20/2017  . On prednisone therapy 11/20/2017  . Primary osteoarthritis of both knees 11/20/2017  . Spondylosis of lumbar spine 11/20/2017  . Ulcerative colitis ([place]) 11/20/2017  . Abnormal laboratory test 11/20/2017  . Polymyalgia rheumatica ([place]) 06/07/2017    Past Medical History:  Diagnosis Date  . Leukopenia   . Migraine   . Osteoarthritis   . Osteoporosis 02/12/2018  . Polymyalgia rheumatica ([place])   . Ulcerative colitis ([place])   . 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[place])     Family History  Problem Relation Age of Onset  . Heart disease Mother   . Osteoporosis Father   . Barrett's esophagus Father   . Migraines Daughter   . Bowel Disease Son   . Colon cancer Neg Hx   . Colon polyps Neg Hx   . Esophageal cancer Neg Hx   . Stomach cancer Neg Hx   . Rectal cancer Neg Hx    Past Surgical History:  Procedure Laterality Date  . COLONOSCOPY    . HAND SURGERY Left    2015 pt reports cutting her hand accidentally and needing exploratory surgery to check for nerve damage  . KNEE SURGERY Right    Social History   Social History Narrative  . Not on file   Immunization History  Administered  Date(s) Administered  . Fluad Quad(high Dose 65+) 01/14/2019  . Influenza, High Dose Seasonal PF 03/06/2018, 01/28/2020  . PFIZER SARS-COV-2 Vaccination 06/04/2019, 06/25/2019, 02/05/2020     Objective: Vital Signs: BP 127/81 (BP Location: Left Arm, Patient Position: Sitting, Cuff Size: Normal)   Pulse 79   Resp 13   Ht 5' 8"  (1.727 m)   Wt 141 lb 6.4 oz (64.1 kg)   BMI 21.50 kg/m    Physical Exam Vitals and nursing note reviewed.  Constitutional:       Appearance: She is well-developed.  HENT:     Head: Normocephalic and atraumatic.  Eyes:     Conjunctiva/sclera: Conjunctivae normal.  Cardiovascular:     Rate and Rhythm: Normal rate and regular rhythm.     Heart sounds: Normal heart sounds.  Pulmonary:     Effort: Pulmonary effort is normal.     Breath sounds: Normal breath sounds.  Abdominal:     General: Bowel sounds are normal.     Palpations: Abdomen is soft.  Musculoskeletal:     Cervical back: Normal range of motion.  Lymphadenopathy:     Cervical: No cervical adenopathy.  Skin:    General: Skin is warm and dry.     Capillary Refill: Capillary refill takes less than 2 seconds.  Neurological:     Mental Status: She is alert and oriented to person, place, and time.  Psychiatric:        Behavior: Behavior normal.      Musculoskeletal Exam: C-spine thoracic and lumbar spine with good range of motion.  Shoulder joints, elbow joints, wrist joints, MCPs PIPs and DIPs with good range of motion with no synovitis.  Hip joints, knee joints, ankles, MTPs and PIPs with good range of motion with no synovitis.  CDAI Exam: CDAI Score: -- Patient Global: --; Provider Global: -- Swollen: --; Tender: -- Joint Exam 03/25/2020   No joint exam has been documented for this visit   There is currently no information documented on the homunculus. Go to the Rheumatology activity and complete the homunculus joint exam.  Investigation: No additional findings.  Imaging: No results found.  Recent Labs: Lab Results  Component Value Date   WBC 7.9 01/01/2020   HGB 13.1 01/01/2020   PLT 361 01/01/2020   NA 136 01/01/2020   K 4.1 01/01/2020   CL 101 01/01/2020   CO2 28 01/01/2020   GLUCOSE 132 (H) 01/01/2020   BUN 15 01/01/2020   CREATININE 0.90 01/01/2020   BILITOT 0.6 01/01/2020   ALKPHOS 84 08/26/2019   AST 21 01/01/2020   ALT 16 01/01/2020   PROT 7.2 01/01/2020   ALBUMIN 4.6 08/26/2019   CALCIUM 10.1 01/01/2020   GFRAA 77  01/01/2020   QFTBGOLDPLUS NEGATIVE 08/26/2019    Speciality Comments: No specialty comments available.  Procedures:  No procedures performed Allergies: Patient has no known allergies.   Assessment / Plan:     Visit Diagnoses: Polymyalgia rheumatica ([place]) -she has been doing well on prednisone 5 mg p.o. daily.  We will taper prednisone by 1 mg every month.  I will send a prescription for prednisone 1 mg tablets.  Plan: Sedimentation rate, Sedimentation rate.  As she has done well on leflunomide in the past we discussed that starting back on leflunomide.  She did not want methotrexate in the past as she drinks alcohol.  Indications side effects contraindications of leflunomide.  Discussed.  She has done well on low-dose leflunomide in the past.  My  plan start her on leflunomide 10 mg p.o. daily.  Medication counseling:   Baseline Immunosuppressant Therapy Labs  Quantiferon TB Gold Latest Ref Rng & Units 08/26/2019  Quantiferon TB Gold Plus NEGATIVE NEGATIVE    Hepatitis Latest Ref Rng & Units 08/26/2019  Hep B Surface Ag NON-REACTI NON-REACTIVE  Hep B IgM NON-REACTI -  Hep C Ab NON-REACTI NON-REACTIVE  Hep C Ab NON-REACTI NON-REACTIVE  Hep A IgM NON-REACTI -    Lab Results  Component Value Date   HIV NON-REACTIVE 06/11/2017    Immunoglobulin Electrophoresis Latest Ref Rng & Units 06/11/2017  IgA  81 - 463 mg/dL 238  IgG 694 - 1,618 mg/dL 1,205  IgM 48 - 271 mg/dL 121    Serum Protein Electrophoresis Latest Ref Rng & Units 01/01/2020  Total Protein 6.1 - 8.1 g/dL 7.2  Albumin 3.8 - 4.8 g/dL -  Alpha-1 0.2 - 0.3 g/dL -  Alpha-2 0.5 - 0.9 g/dL -  Beta Globulin 0.4 - 0.6 g/dL -  Beta 2 0.2 - 0.5 g/dL -  Gamma Globulin 0.8 - 1.7 g/dL -    No results found for: G6PDH  Lab Results  Component Value Date   TPMT 7 (L) 09/30/2019     Patient was counseled on the purpose, proper use, and adverse effects of leflunomide including risk of infection, nausea/diarrhea/weight loss,  increase in blood pressure, rash, hair loss, tingling in the hands and feet, and signs and symptoms of interstitial lung disease.   Also counseled on Black Box warning of liver injury and importance of avoiding alcohol while on therapy. Discussed that there is the possibility of an increased risk of malignancy but it is not well understood if this increased risk is due to the medication or the disease state.  Counseled patient to avoid live vaccines. Recommend annual influenza, Pneumovax 23, Prevnar 13, and Shingrix as indicated.   Discussed the importance of frequent monitoring of liver function and blood count.  Standing orders placed. Provided patient with educational materials on leflunomide and answered all questions.  Patient consented to [place] use, and consent will be uploaded into the media tab.    Positive anti-CCP test -she has had positive anti-CCP antibody in the past and never developed inflammatory arthritis.  I will repeat some of the antibodies today.  Plan: Rheumatoid factor, Cyclic citrul peptide antibody, IgG, Rheumatoid factor, Cyclic citrul peptide antibody, IgG  Other ulcerative colitis with other complication ([place]) - She follows up closely with Dr. Hilarie Fredrickson.  She states her GI symptoms improved since she has been on low-dose prednisone.  She states that Humira was not covered by her insurance.  On prednisone therapy -she is currently on prednisone 5 mg p.o. daily.  High risk medication use - Sulfasalazine 500 mg 2 tablet by mouth twice daily - Plan: CBC with Differential/Platelet, COMPLETE METABOLIC PANEL WITH GFR, COMPLETE METABOLIC PANEL WITH GFR, CBC with Differential/Platelet today.  Primary osteoarthritis of both knees-she is currently not having any discomfort.  Age-related osteoporosis without current pathological fracture - DEXA from January 2020 showed T-score of -2.5 and BMD 0.574 at right hip.Fosamax 70 mg 1 tablet by mouth once weekly.  She has been tolerating Fosamax  well.  We will check her bone density in 2022.  Vitamin D deficiency-her vitamin D was low at 38 on January 01, 2020.  She has been taking vitamin D supplement.  We will check vitamin D level today.  I have advised her to stay on vitamin D 2000 units  daily.  Orders: Orders Placed This Encounter  Procedures  . CBC with Differential/Platelet  . COMPLETE METABOLIC PANEL WITH GFR  . Sedimentation rate  . Rheumatoid factor  . Cyclic citrul peptide antibody, IgG  . VITAMIN D 25 Hydroxy (Vit-D Deficiency, Fractures)   Meds ordered this encounter  Medications  . predniSONE (DELTASONE) 1 MG tablet    Sig: Take 69m by mouth daily for 1 month, follow taper as instructed.    Dispense:  120 tablet    Refill:  0  . leflunomide (ARAVA) 10 MG tablet    Sig: Take 1 tablet (10 mg total) by mouth daily.    Dispense:  30 tablet    Refill:  2    .  Follow-Up Instructions: Return in about 6 weeks (around 05/06/2020) for Polymyalgia rheumatica.   SBo Merino MD  Note - This record has been created using DEditor, commissioning  Chart creation errors have been sought, but may not always  have been located. Such creation errors do not reflect on  the standard of medical care.

## 2020-03-25 ENCOUNTER — Other Ambulatory Visit: Payer: Self-pay

## 2020-03-25 ENCOUNTER — Encounter: Payer: Self-pay | Admitting: Rheumatology

## 2020-03-25 ENCOUNTER — Ambulatory Visit: Payer: Medicare HMO | Admitting: Rheumatology

## 2020-03-25 VITALS — BP 127/81 | HR 79 | Resp 13 | Ht 68.0 in | Wt 141.4 lb

## 2020-03-25 DIAGNOSIS — E559 Vitamin D deficiency, unspecified: Secondary | ICD-10-CM | POA: Diagnosis not present

## 2020-03-25 DIAGNOSIS — M353 Polymyalgia rheumatica: Secondary | ICD-10-CM

## 2020-03-25 DIAGNOSIS — Z7952 Long term (current) use of systemic steroids: Secondary | ICD-10-CM

## 2020-03-25 DIAGNOSIS — M170 Bilateral primary osteoarthritis of knee: Secondary | ICD-10-CM | POA: Diagnosis not present

## 2020-03-25 DIAGNOSIS — Z79899 Other long term (current) drug therapy: Secondary | ICD-10-CM | POA: Diagnosis not present

## 2020-03-25 DIAGNOSIS — M810 Age-related osteoporosis without current pathological fracture: Secondary | ICD-10-CM

## 2020-03-25 DIAGNOSIS — R768 Other specified abnormal immunological findings in serum: Secondary | ICD-10-CM

## 2020-03-25 DIAGNOSIS — M6281 Muscle weakness (generalized): Secondary | ICD-10-CM

## 2020-03-25 DIAGNOSIS — K51818 Other ulcerative colitis with other complication: Secondary | ICD-10-CM

## 2020-03-25 LAB — VITAMIN D 25 HYDROXY (VIT D DEFICIENCY, FRACTURES): Vit D, 25-Hydroxy: 67 ng/mL (ref 30–100)

## 2020-03-25 MED ORDER — PREDNISONE 1 MG PO TABS: ORAL_TABLET | ORAL | 0 refills | Status: DC

## 2020-03-25 MED ORDER — LEFLUNOMIDE 10 MG PO TABS: 10.0000 mg | ORAL_TABLET | Freq: Every day | ORAL | 2 refills | Status: DC

## 2020-03-25 NOTE — Patient Instructions (Addendum)
Please take vitamin D 2,000 units daily to maintain vitamin D level     Leflunomide tablets What is this medicine? LEFLUNOMIDE (le FLOO na mide) is for rheumatoid arthritis. This medicine may be used for other purposes; ask your health care provider or pharmacist if you have questions. COMMON BRAND NAME(S): Arava What should I tell my health care provider before I take this medicine? They need to know if you have any of these conditions:  diabetes  have a fever or infection  high blood pressure  immune system problems  kidney disease  liver disease  low blood cell counts, like low white cell, platelet, or red cell counts  lung or breathing disease, like asthma  recently received or scheduled to receive a vaccine  receiving treatment for cancer  skin conditions or sensitivity  tingling of the fingers or toes, or other nerve disorder  tuberculosis  an unusual or allergic reaction to leflunomide, teriflunomide, other medicines, food, dyes, or preservatives  pregnant or trying to get pregnant  breast-feeding How should I use this medicine? Take this medicine by mouth with a full glass of water. Follow the directions on the prescription label. Take your medicine at regular intervals. Do not take your medicine more often than directed. Do not stop taking except on your doctor's advice. Talk to your pediatrician regarding the use of this medicine in children. Special care may be needed. Overdosage: If you think you have taken too much of this medicine contact a poison control center or emergency room at once. NOTE: This medicine is only for you. Do not share this medicine with others. What if I miss a dose? If you miss a dose, take it as soon as you can. If it is almost time for your next dose, take only that dose. Do not take double or extra doses. What may interact with this medicine? Do not take this medicine with any of the following medications:  teriflunomide This  medicine may also interact with the following medications:  alosetron  birth control pills  caffeine  cefaclor  certain medicines for diabetes like nateglinide, repaglinide, rosiglitazone, pioglitazone  certain medicines for high cholesterol like atorvastatin, pravastatin, rosuvastatin, simvastatin  charcoal  cholestyramine  ciprofloxacin  duloxetine  furosemide  ketoprofen  live virus vaccines  medicines that increase your risk for infection  methotrexate  mitoxantrone  paclitaxel  penicillin  theophylline  tizanidine  warfarin This list may not describe all possible interactions. Give your health care provider a list of all the medicines, herbs, non-prescription drugs, or dietary supplements you use. Also tell them if you smoke, drink alcohol, or use illegal drugs. Some items may interact with your medicine. What should I watch for while using this medicine? Visit your health care provider for regular checks on your progress. Tell your doctor or health care provider if your symptoms do not start to get better or if they get worse. You may need blood work done while you are taking this medicine. This medicine may cause serious skin reactions. They can happen weeks to months after starting the medicine. Contact your health care provider right away if you notice fevers or flu-like symptoms with a rash. The rash may be red or purple and then turn into blisters or peeling of the skin. Or, you might notice a red rash with swelling of the face, lips or lymph nodes in your neck or under your arms. This medicine may stay in your body for up to 2 years after  your last dose. Tell your doctor about any unusual side effects or symptoms. A medicine can be given to help lower your blood levels of this medicine more quickly. Women must use effective birth control with this medicine. There is a potential for serious side effects to an unborn child. Do not become pregnant while taking  this medicine. Inform your doctor if you wish to become pregnant. This medicine remains in your blood after you stop taking it. You must continue using effective birth control until the blood levels have been checked and they are low enough. A medicine can be given to help lower your blood levels of this medicine more quickly. Immediately talk to your doctor if you think you may be pregnant. You may need a pregnancy test. Talk to your health care provider or pharmacist for more information. You should not receive certain vaccines during your treatment and for a certain time after your treatment with this medication ends. Talk to your health care provider for more information. What side effects may I notice from receiving this medicine? Side effects that you should report to your doctor or health care professional as soon as possible:  allergic reactions like skin rash, itching or hives, swelling of the face, lips, or tongue  breathing problems  cough  increased blood pressure  low blood counts - this medicine may decrease the number of white blood cells and platelets. You may be at increased risk for infections and bleeding.  pain, tingling, numbness in the hands or feet  rash, fever, and swollen lymph nodes  redness, blistering, peeing or loosening of the skin, including inside the mouth  signs of decreased platelets or bleeding - bruising, pinpoint red spots on the skin, black, tarry stools, blood in urine  signs of infection - fever or chills, cough, sore throat, pain or trouble passing urine  signs and symptoms of liver injury like dark yellow or brown urine; general ill feeling or flu-like symptoms; light-colored stools; loss of appetite; nausea; right upper belly pain; unusually weak or tired; yellowing of the eyes or skin  trouble passing urine or change in the amount of urine  vomiting Side effects that usually do not require medical attention (report to your doctor or health  care professional if they continue or are bothersome):  diarrhea  hair thinning or loss  headache  nausea  tiredness This list may not describe all possible side effects. Call your doctor for medical advice about side effects. You may report side effects to FDA at 1-800-FDA-1088. Where should I keep my medicine? Keep out of the reach of children. Store at room temperature between 15 and 30 degrees C (59 and 86 degrees F). Protect from moisture and light. Throw away any unused medicine after the expiration date. NOTE: This sheet is a summary. It may not cover all possible information. If you have questions about this medicine, talk to your doctor, pharmacist, or health care provider.  2020 Elsevier/Gold Standard (2018-08-02 15:06:48)  Standing Labs We placed an order today for your standing lab work.   Please have your standing labs drawn in 2 weeks and then every 2 months.  If possible, please have your labs drawn 2 weeks prior to your appointment so that the provider can discuss your results at your appointment.  We have open lab daily Monday through Thursday from 8:30-12:30 PM and 1:30-4:30 PM and Friday from 8:30-12:30 PM and 1:30-4:00 PM at the office of Dr. Bo Merino, [facility].  Please be advised, patients with office appointments requiring lab work will take precedents over walk-in lab work.  If possible, please come for your lab work on Monday and Friday afternoons, as you may experience shorter wait times. The office is located at [address], [place], [place],  [address] No appointment is necessary.   Labs are drawn by [facility]. Please bring your co-pay at the time of your lab draw.  You may receive a bill from [facility] for your lab work.  If you wish to have your labs drawn at another location, please call the office 24 hours in advance to send orders.  If you have any questions regarding directions or hours of operation,  please call  [phone].   As a reminder, please drink plenty of water prior to coming for your lab work. Thanks!

## 2020-03-26 DIAGNOSIS — Z0000 Encounter for general adult medical examination without abnormal findings: Secondary | ICD-10-CM | POA: Diagnosis not present

## 2020-03-26 LAB — CYCLIC CITRUL PEPTIDE ANTIBODY, IGG: Cyclic Citrullin Peptide Ab: 31 UNITS — ABNORMAL HIGH

## 2020-03-26 LAB — CBC WITH DIFFERENTIAL/PLATELET
Absolute Monocytes: 720 cells/uL (ref 200–950)
Basophils Absolute: 7 cells/uL (ref 0–200)
Basophils Relative: 0.1 %
Eosinophils Absolute: 0 cells/uL — ABNORMAL LOW (ref 15–500)
Eosinophils Relative: 0 %
HCT: 41.7 % (ref 35.0–45.0)
Hemoglobin: 13.7 g/dL (ref 11.7–15.5)
Lymphs Abs: 1217 cells/uL (ref 850–3900)
MCH: 31.3 pg (ref 27.0–33.0)
MCHC: 32.9 g/dL (ref 32.0–36.0)
MCV: 95.2 fL (ref 80.0–100.0)
MPV: 10.4 fL (ref 7.5–12.5)
Monocytes Relative: 10 %
Neutro Abs: 5256 cells/uL (ref 1500–7800)
Neutrophils Relative %: 73 %
Platelets: 382 10*3/uL (ref 140–400)
RBC: 4.38 10*6/uL (ref 3.80–5.10)
RDW: 11.4 % (ref 11.0–15.0)
Total Lymphocyte: 16.9 %
WBC: 7.2 10*3/uL (ref 3.8–10.8)

## 2020-03-26 LAB — SEDIMENTATION RATE: Sed Rate: 17 mm/h (ref 0–30)

## 2020-03-26 LAB — COMPLETE METABOLIC PANEL WITH GFR
AG Ratio: 1.6 (calc) (ref 1.0–2.5)
ALT: 19 U/L (ref 6–29)
AST: 21 U/L (ref 10–35)
Albumin: 4.6 g/dL (ref 3.6–5.1)
Alkaline phosphatase (APISO): 69 U/L (ref 37–153)
BUN: 18 mg/dL (ref 7–25)
CO2: 29 mmol/L (ref 20–32)
Calcium: 10.6 mg/dL — ABNORMAL HIGH (ref 8.6–10.4)
Chloride: 103 mmol/L (ref 98–110)
Creat: 0.84 mg/dL (ref 0.50–0.99)
GFR, Est African American: 83 mL/min/{1.73_m2} (ref >=60)
GFR, Est Non African American: 72 mL/min/{1.73_m2} (ref >=60)
Globulin: 2.8 g/dL (calc) (ref 1.9–3.7)
Glucose, Bld: 112 mg/dL (ref 65–139)
Potassium: 4.9 mmol/L (ref 3.5–5.3)
Sodium: 136 mmol/L (ref 135–146)
Total Bilirubin: 0.4 mg/dL (ref 0.2–1.2)
Total Protein: 7.4 g/dL (ref 6.1–8.1)

## 2020-03-26 LAB — RHEUMATOID FACTOR: Rheumatoid fact SerPl-aCnc: 25 IU/mL — ABNORMAL HIGH (ref <14)

## 2020-03-28 NOTE — Progress Notes (Signed)
Rheumatoid factor and anti-CCP are positive.  These labs are consistent with rheumatoid arthritis.  Calcium is mildly elevated.  Most likely due to vitamin D use.  Vitamin D is normal now.  She should take maintenance dose of vitamin D 2000 units daily.

## 2020-03-30 ENCOUNTER — Telehealth: Payer: Self-pay

## 2020-03-30 NOTE — Telephone Encounter (Signed)
Patient called stating she was returning Andrea's call regarding her labwork results.  Patient was told she will receive a return call in 24-48 hours.

## 2020-03-30 NOTE — Telephone Encounter (Signed)
I called patient with lab results. 

## 2020-04-13 ENCOUNTER — Other Ambulatory Visit: Payer: Self-pay | Admitting: Rheumatology

## 2020-04-14 ENCOUNTER — Other Ambulatory Visit: Payer: Self-pay | Admitting: Internal Medicine

## 2020-04-19 NOTE — Progress Notes (Deleted)
Office Visit Note  Patient: Terri Hudson             Date of Birth: 05-Jan-1951           MRN: [id number]             PCP: Maurice Small, MD Referring: Maurice Small, MD Visit Date: 05/03/2020 Occupation: @GUAROCC @  Subjective:  No chief complaint on file.   History of Present Illness: Terri Hudson is a 69 y.o. female ***   Activities of Daily Living:  Patient reports morning stiffness for *** {minute/hour:19697}.   Patient {ACTIONS;DENIES/REPORTS:21021675::"Denies"} nocturnal pain.  Difficulty dressing/grooming: {ACTIONS;DENIES/REPORTS:21021675::"Denies"} Difficulty climbing stairs: {ACTIONS;DENIES/REPORTS:21021675::"Denies"} Difficulty getting out of chair: {ACTIONS;DENIES/REPORTS:21021675::"Denies"} Difficulty using hands for taps, buttons, cutlery, and/or writing: {ACTIONS;DENIES/REPORTS:21021675::"Denies"}  No Rheumatology ROS completed.   PMFS History:  Patient Active Problem List   Diagnosis Date Noted  . High risk medication use 11/20/2017  . On prednisone therapy 11/20/2017  . Primary osteoarthritis of both knees 11/20/2017  . Spondylosis of lumbar spine 11/20/2017  . Ulcerative colitis ([place]) 11/20/2017  . Abnormal laboratory test 11/20/2017  . Polymyalgia rheumatica ([place]) 06/07/2017    Past Medical History:  Diagnosis Date  . Leukopenia   . Migraine   . Osteoarthritis   . Osteoporosis 02/12/2018  . Polymyalgia rheumatica ([place])   . Ulcerative colitis ([place])   . 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 Heart disease Mother   . Osteoporosis Father   . Barrett's esophagus Father   . Migraines Daughter   . Bowel Disease Son   . Colon cancer Neg Hx   . Colon polyps Neg Hx   . Esophageal cancer Neg Hx   . Stomach cancer Neg Hx   . Rectal cancer Neg Hx    Past Surgical History:  Procedure Laterality Date  . COLONOSCOPY    . HAND SURGERY Left    2015 pt reports cutting her hand accidentally and  needing exploratory surgery to check for nerve damage  . KNEE SURGERY Right    Social History   Social History Narrative  . Not on file   Immunization History  Administered Date(s) Administered  . Fluad Quad(high Dose 65+) 01/14/2019  . Influenza, High Dose Seasonal PF 03/06/2018, 01/28/2020  . PFIZER SARS-COV-2 Vaccination 06/04/2019, 06/25/2019, 02/05/2020     Objective: Vital Signs: There were no vitals taken for this visit.   Physical Exam   Musculoskeletal Exam: ***  CDAI Exam: CDAI Score: - Patient Global: -; Provider Global: - Swollen: -; Tender: - Joint Exam 05/03/2020   No joint exam has been documented for this visit   There is currently no information documented on the homunculus. Go to the Rheumatology activity and complete the homunculus joint exam.  Investigation: No additional findings.  Imaging: No results found.  Recent Labs: Lab Results  Component Value Date   WBC 7.2 03/25/2020   HGB 13.7 03/25/2020   PLT 382 03/25/2020   NA 136 03/25/2020   K 4.9 03/25/2020   CL 103 03/25/2020   CO2 29 03/25/2020   GLUCOSE 112 03/25/2020   BUN 18 03/25/2020   CREATININE 0.84 03/25/2020   BILITOT 0.4 03/25/2020   ALKPHOS 84 08/26/2019   AST 21 03/25/2020   ALT 19 03/25/2020   PROT 7.4 03/25/2020   ALBUMIN 4.6 08/26/2019   CALCIUM 10.6 (H) 03/25/2020   GFRAA 83 03/25/2020   QFTBGOLDPLUS NEGATIVE 08/26/2019    Speciality Comments: No specialty  comments available.  Procedures:  No procedures performed Allergies: Patient has no known allergies.   Assessment / Plan:     Visit Diagnoses: No diagnosis found.  Orders: No orders of the defined types were placed in this encounter.  No orders of the defined types were placed in this encounter.   Face-to-face time spent with patient was *** minutes. Greater than 50% of time was spent in counseling and coordination of care.  Follow-Up Instructions: No follow-ups on file.   Earnestine Mealing,  CMA  Note - This record has been created using Editor, commissioning.  Chart creation errors have been sought, but may not always  have been located. Such creation errors do not reflect on  the standard of medical care.

## 2020-05-03 ENCOUNTER — Ambulatory Visit: Payer: Medicare HMO | Admitting: Rheumatology

## 2020-05-03 DIAGNOSIS — M170 Bilateral primary osteoarthritis of knee: Secondary | ICD-10-CM

## 2020-05-03 DIAGNOSIS — K51818 Other ulcerative colitis with other complication: Secondary | ICD-10-CM

## 2020-05-03 DIAGNOSIS — R768 Other specified abnormal immunological findings in serum: Secondary | ICD-10-CM

## 2020-05-03 DIAGNOSIS — Z79899 Other long term (current) drug therapy: Secondary | ICD-10-CM

## 2020-05-03 DIAGNOSIS — M810 Age-related osteoporosis without current pathological fracture: Secondary | ICD-10-CM

## 2020-05-03 DIAGNOSIS — Z7952 Long term (current) use of systemic steroids: Secondary | ICD-10-CM

## 2020-05-03 DIAGNOSIS — E559 Vitamin D deficiency, unspecified: Secondary | ICD-10-CM

## 2020-05-03 DIAGNOSIS — M353 Polymyalgia rheumatica: Secondary | ICD-10-CM

## 2020-05-19 ENCOUNTER — Ambulatory Visit: Payer: Medicare HMO | Admitting: Neurology

## 2020-06-01 NOTE — Progress Notes (Signed)
Office Visit Note  Patient: Terri Hudson             Date of Birth: 02/04/1951           MRN: [id number]             PCP: Maurice Small, MD Referring: Maurice Small, MD Visit Date: 06/15/2020 Occupation: @GUAROCC @  Subjective:  Medication management.   History of Present Illness: Terri Hudson is a 70 y.o. female with history of polymyalgia rheumatica, rheumatoid arthritis and ulcerative colitis.  She states she has been doing well without any joint pain, joint swelling or muscle pain.  She is on prednisone 10 mg p.o. daily.  She will be tapering it by 1 mg every month.  She states every time she tapers prednisone she starts aching everywhere and also starts having a flare of ulcerative colitis.  Activities of Daily Living:  Patient reports morning stiffness for 5 minutes.   Patient Denies nocturnal pain.  Difficulty dressing/grooming: Denies Difficulty climbing stairs: Denies Difficulty getting out of chair: Denies Difficulty using hands for taps, buttons, cutlery, and/or writing: Denies  Review of Systems  Constitutional: Negative for fatigue.  HENT: Negative for mouth sores, mouth dryness and nose dryness.   Eyes: Negative for pain, itching and dryness.  Respiratory: Negative for shortness of breath and difficulty breathing.   Cardiovascular: Negative for chest pain and palpitations.  Gastrointestinal: Negative for blood in stool, constipation and diarrhea.  Endocrine: Negative for increased urination.  Genitourinary: Negative for difficulty urinating.  Musculoskeletal: Positive for morning stiffness. Negative for arthralgias, joint pain, joint swelling, myalgias, muscle tenderness and myalgias.  Skin: Negative for color change, rash and redness.  Allergic/Immunologic: Negative for susceptible to infections.  Neurological: Negative for dizziness, numbness, headaches, memory loss and weakness.  Hematological: Negative for bruising/bleeding tendency.   Psychiatric/Behavioral: Negative for confusion.    PMFS History:  Patient Active Problem List   Diagnosis Date Noted  . Rheumatoid arthritis involving multiple sites with positive rheumatoid factor ([place]) 06/15/2020  . High risk medication use 11/20/2017  . On prednisone therapy 11/20/2017  . Primary osteoarthritis of both knees 11/20/2017  . Spondylosis of lumbar spine 11/20/2017  . Ulcerative colitis ([place]) 11/20/2017  . Abnormal laboratory test 11/20/2017  . Polymyalgia rheumatica ([place]) 06/07/2017    Past Medical History:  Diagnosis Date  . Leukopenia   . Migraine   . Osteoarthritis   . Osteoporosis 02/12/2018  . Polymyalgia rheumatica ([place])   . Ulcerative colitis ([place])   . 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 Heart disease Mother   . Osteoporosis Father   . Barrett's esophagus Father   . Migraines Daughter   . Bowel Disease Son   . Colon cancer Neg Hx   . Colon polyps Neg Hx   . Esophageal cancer Neg Hx   . Stomach cancer Neg Hx   . Rectal cancer Neg Hx    Past Surgical History:  Procedure Laterality Date  . COLONOSCOPY    . HAND SURGERY Left    2015 pt reports cutting her hand accidentally and needing exploratory surgery to check for nerve damage  . KNEE SURGERY Right    Social History   Social History Narrative  . Not on file   Immunization History  Administered Date(s) Administered  . Fluad Quad(high Dose 65+) 01/14/2019  . Influenza, High Dose Seasonal PF 03/06/2018, 01/28/2020  . PFIZER(Purple Top)SARS-COV-2 Vaccination 06/04/2019, 06/25/2019, 02/05/2020  Objective: Vital Signs: BP 132/79 (BP Location: Left Arm, Patient Position: Sitting, Cuff Size: Normal)   Pulse 75   Resp 14   Ht 5' 8"  (1.727 m)   Wt 142 lb 6.4 oz (64.6 kg)   BMI 21.65 kg/m    Physical Exam Vitals and nursing note reviewed.  Constitutional:      Appearance: She is well-developed and well-nourished.  HENT:     Head:  Normocephalic and atraumatic.  Eyes:     Extraocular Movements: EOM normal.     Conjunctiva/sclera: Conjunctivae normal.  Cardiovascular:     Rate and Rhythm: Normal rate and regular rhythm.     Pulses: Intact distal pulses.     Heart sounds: Normal heart sounds.  Pulmonary:     Effort: Pulmonary effort is normal.     Breath sounds: Normal breath sounds.  Abdominal:     General: Bowel sounds are normal.     Palpations: Abdomen is soft.  Musculoskeletal:     Cervical back: Normal range of motion.  Lymphadenopathy:     Cervical: No cervical adenopathy.  Skin:    General: Skin is warm and dry.     Capillary Refill: Capillary refill takes less than 2 seconds.  Neurological:     Mental Status: She is alert and oriented to person, place, and time.  Psychiatric:        Mood and Affect: Mood and affect normal.        Behavior: Behavior normal.      Musculoskeletal Exam: C-spine thoracic and lumbar spine with good range of motion.  Shoulder joints, elbow joints, wrist joints, MCPs PIPs and DIPs with good range of motion with no synovitis.  Hip joints, knee joints, ankles, MTPs and PIPs with good range of motion with no synovitis.  She had no muscular weakness or tenderness  CDAI Exam: CDAI Score: 0  Patient Global: 0 mm; Provider Global: 0 mm Swollen: 0 ; Tender: 0  Joint Exam 06/15/2020   No joint exam has been documented for this visit   There is currently no information documented on the homunculus. Go to the Rheumatology activity and complete the homunculus joint exam.  Investigation: No additional findings.  Imaging: No results found.  Recent Labs: Lab Results  Component Value Date   WBC 7.2 03/25/2020   HGB 13.7 03/25/2020   PLT 382 03/25/2020   NA 136 03/25/2020   K 4.9 03/25/2020   CL 103 03/25/2020   CO2 29 03/25/2020   GLUCOSE 112 03/25/2020   BUN 18 03/25/2020   CREATININE 0.84 03/25/2020   BILITOT 0.4 03/25/2020   ALKPHOS 84 08/26/2019   AST 21  03/25/2020   ALT 19 03/25/2020   PROT 7.4 03/25/2020   ALBUMIN 4.6 08/26/2019   CALCIUM 10.6 (H) 03/25/2020   GFRAA 83 03/25/2020   QFTBGOLDPLUS NEGATIVE 08/26/2019    Speciality Comments: No specialty comments available.  Procedures:  No procedures performed Allergies: Patient has no known allergies.   Assessment / Plan:     Visit Diagnoses: Polymyalgia rheumatica (HCC)-she has no muscular weakness or tenderness on examination today.  Rheumatoid arthritis involving multiple sites with positive rheumatoid factor (HCC) - +RF, +anti-CCP.  She has never developed inflammatory arthritis although she states she gets so stiff that she cannot move once she gets off prednisone.  I have not witnessed synovitis.  Other ulcerative colitis with other complication ([place]) - She follows up closely with Dr. Hilarie Fredrickson.  Patient states when she gets off prednisone  she has UC flare.  Dr. Hilarie Fredrickson has discussed Humira with her in the past.  I believe Humira will be a good choice if she flares this time coming off prednisone.  On prednisone therapy - Prednisone 2 mg po qd.  She will be tapering prednisone by 1 mg every month.  High risk medication use - Arava 46m po qd, Sulfasalazine 500 mg 2 tablet by mouth twice daily-for UC by Dr. PHilarie Fredrickson - Plan: CBC with Differential/Platelet, COMPLETE METABOLIC PANEL WITH GFR today and then every 3 months to monitor for drug toxicity.  Primary osteoarthritis of both knees-she currently has no discomfort.  Age-related osteoporosis without current pathological fracture - DEXA from January 2020 showed T-score of -2.5 and BMD 0.574 at right hip.Fosamax 70 mg 1 tablet by mouth once weekly.  I will repeat DEXA scan.  Vitamin D deficiency - she is on OTC Vitamin D  Orders: Orders Placed This Encounter  Procedures  . DG BONE DENSITY (DXA)  . CBC with Differential/Platelet  . COMPLETE METABOLIC PANEL WITH GFR   No orders of the defined types were placed in this  encounter.    Follow-Up Instructions: Return in about 3 months (around 09/12/2020) for Polymyalgia rheumatica, Rheumatoid arthritis.   SBo Merino MD  Note - This record has been created using DEditor, commissioning  Chart creation errors have been sought, but may not always  have been located. Such creation errors do not reflect on  the standard of medical care.

## 2020-06-10 ENCOUNTER — Other Ambulatory Visit: Payer: Self-pay | Admitting: *Deleted

## 2020-06-10 DIAGNOSIS — Z79899 Other long term (current) drug therapy: Secondary | ICD-10-CM

## 2020-06-15 ENCOUNTER — Other Ambulatory Visit: Payer: Self-pay

## 2020-06-15 ENCOUNTER — Encounter: Payer: Self-pay | Admitting: Rheumatology

## 2020-06-15 ENCOUNTER — Ambulatory Visit: Payer: Medicare HMO | Admitting: Rheumatology

## 2020-06-15 VITALS — BP 132/79 | HR 75 | Resp 14 | Ht 68.0 in | Wt 142.4 lb

## 2020-06-15 DIAGNOSIS — M170 Bilateral primary osteoarthritis of knee: Secondary | ICD-10-CM

## 2020-06-15 DIAGNOSIS — M810 Age-related osteoporosis without current pathological fracture: Secondary | ICD-10-CM

## 2020-06-15 DIAGNOSIS — M353 Polymyalgia rheumatica: Secondary | ICD-10-CM

## 2020-06-15 DIAGNOSIS — E559 Vitamin D deficiency, unspecified: Secondary | ICD-10-CM | POA: Diagnosis not present

## 2020-06-15 DIAGNOSIS — Z7952 Long term (current) use of systemic steroids: Secondary | ICD-10-CM

## 2020-06-15 DIAGNOSIS — Z79899 Other long term (current) drug therapy: Secondary | ICD-10-CM

## 2020-06-15 DIAGNOSIS — R768 Other specified abnormal immunological findings in serum: Secondary | ICD-10-CM

## 2020-06-15 DIAGNOSIS — K51818 Other ulcerative colitis with other complication: Secondary | ICD-10-CM

## 2020-06-15 DIAGNOSIS — M0579 Rheumatoid arthritis with rheumatoid factor of multiple sites without organ or systems involvement: Secondary | ICD-10-CM | POA: Diagnosis not present

## 2020-06-15 NOTE — Patient Instructions (Signed)
Standing Labs We placed an order today for your standing lab work.   Please have your standing labs drawn in May and every 3 months  If possible, please have your labs drawn 2 weeks prior to your appointment so that the provider can discuss your results at your appointment.  We have open lab daily Monday through Thursday from 8:30-12:30 PM and 1:30-4:30 PM and Friday from 8:30-12:30 PM and 1:30-4:00 PM at the office of Dr. Bo Merino, [facility].   Please be advised, all patients with office appointments requiring lab work will take precedents over walk-in lab work.  If possible, please come for your lab work on Monday and Friday afternoons, as you may experience shorter wait times. The office is located at [address], [place], [place], [place] [address] No appointment is necessary.   Labs are drawn by [facility]. Please bring your co-pay at the time of your lab draw.  You may receive a bill from [facility] for your lab work.  If you wish to have your labs drawn at another location, please call the office 24 hours in advance to send orders.  If you have any questions regarding directions or hours of operation,  please call [phone].   As a reminder, please drink plenty of water prior to coming for your lab work. Thanks!   COVID-19 vaccine recommendations:   COVID-19 vaccine is recommended for everyone (unless you are allergic to a vaccine component), even if you are on a medication that suppresses your immune system.   It is recommended that individuals with immunosuppressive therapy should get three  COVID-19 vaccines 1 month apart and then a booster (fourth dose) 6 months later  Do not take Tylenol or any anti-inflammatory medications (NSAIDs) 24 hours prior to the COVID-19 vaccination.   There is no direct evidence about the efficacy of the COVID-19 vaccine in individuals who are on medications that suppress the immune system.   Even if you are fully  vaccinated, and you are on any medications that suppress your immune system, please continue to wear a mask, maintain at least six feet social distance and practice hand hygiene.   If you develop a COVID-19 infection, please contact your PCP or our office to determine if you need monoclonal antibody infusion.  The booster vaccine is now available for immunocompromised patients.   Please see the following web sites for updated information.   [email or web address]

## 2020-06-16 LAB — COMPLETE METABOLIC PANEL WITH GFR
AG Ratio: 1.8 (calc) (ref 1.0–2.5)
ALT: 19 U/L (ref 6–29)
AST: 20 U/L (ref 10–35)
Albumin: 4.5 g/dL (ref 3.6–5.1)
Alkaline phosphatase (APISO): 57 U/L (ref 37–153)
BUN: 17 mg/dL (ref 7–25)
CO2: 28 mmol/L (ref 20–32)
Calcium: 10 mg/dL (ref 8.6–10.4)
Chloride: 103 mmol/L (ref 98–110)
Creat: 0.85 mg/dL (ref 0.50–0.99)
GFR, Est African American: 82 mL/min/{1.73_m2} (ref >=60)
GFR, Est Non African American: 71 mL/min/{1.73_m2} (ref >=60)
Globulin: 2.5 g/dL (calc) (ref 1.9–3.7)
Glucose, Bld: 136 mg/dL — ABNORMAL HIGH (ref 65–99)
Potassium: 4.1 mmol/L (ref 3.5–5.3)
Sodium: 139 mmol/L (ref 135–146)
Total Bilirubin: 0.5 mg/dL (ref 0.2–1.2)
Total Protein: 7 g/dL (ref 6.1–8.1)

## 2020-06-16 LAB — CBC WITH DIFFERENTIAL/PLATELET
Absolute Monocytes: 719 cells/uL (ref 200–950)
Basophils Absolute: 17 cells/uL (ref 0–200)
Basophils Relative: 0.3 %
Eosinophils Absolute: 0 cells/uL — ABNORMAL LOW (ref 15–500)
Eosinophils Relative: 0 %
HCT: 40.6 % (ref 35.0–45.0)
Hemoglobin: 13.6 g/dL (ref 11.7–15.5)
Lymphs Abs: 1392 cells/uL (ref 850–3900)
MCH: 31.9 pg (ref 27.0–33.0)
MCHC: 33.5 g/dL (ref 32.0–36.0)
MCV: 95.3 fL (ref 80.0–100.0)
MPV: 10.9 fL (ref 7.5–12.5)
Monocytes Relative: 12.4 %
Neutro Abs: 3671 cells/uL (ref 1500–7800)
Neutrophils Relative %: 63.3 %
Platelets: 314 10*3/uL (ref 140–400)
RBC: 4.26 10*6/uL (ref 3.80–5.10)
RDW: 11 % (ref 11.0–15.0)
Total Lymphocyte: 24 %
WBC: 5.8 10*3/uL (ref 3.8–10.8)

## 2020-06-16 NOTE — Progress Notes (Signed)
CBC is normal.  Glucose is mildly elevated, probably not a fasting sample.

## 2020-07-05 ENCOUNTER — Other Ambulatory Visit: Payer: Self-pay | Admitting: Internal Medicine

## 2020-07-06 ENCOUNTER — Other Ambulatory Visit: Payer: Self-pay | Admitting: Rheumatology

## 2020-07-06 NOTE — Telephone Encounter (Signed)
Last Visit: 06/15/2020 Next Visit: 10/05/2020 Labs: 06/15/2020, CBC is normal. Glucose is mildly elevated, probably not a fasting sample.  Current Dose per office note 06/15/2020, Arava 96m po qd DX: Polymyalgia rheumatica  Last Fill: 03/25/2020  Okay to refill Arava ?

## 2020-07-21 ENCOUNTER — Encounter: Payer: Self-pay | Admitting: Rheumatology

## 2020-07-21 DIAGNOSIS — Z780 Asymptomatic menopausal state: Secondary | ICD-10-CM | POA: Diagnosis not present

## 2020-07-21 DIAGNOSIS — M8589 Other specified disorders of bone density and structure, multiple sites: Secondary | ICD-10-CM | POA: Diagnosis not present

## 2020-07-21 DIAGNOSIS — Z1231 Encounter for screening mammogram for malignant neoplasm of breast: Secondary | ICD-10-CM | POA: Diagnosis not present

## 2020-07-22 ENCOUNTER — Telehealth: Payer: Self-pay | Admitting: *Deleted

## 2020-07-22 NOTE — Telephone Encounter (Signed)
Received DEXA results from [facility]  Date of Scan: 07/21/2020 Lowest T-score and lowest site measured:-2.3 right femoral neck BMD 0.591 Significant changes in BMD and site measured (5% and above):5% change in BMD left total femur  Current Regimen:Fosamax, Vitamin D  Osteopenia slight improvement  Recommendation:continue calcium, vitamin D and resistive exercises, repeat DEXA in 2 years.  Reviewed by:Hazel Sams, PA-C  Next Appointment: 10/05/2020

## 2020-07-27 NOTE — Progress Notes (Signed)
[facility]    Provider:  Dr Jaynee Eagles Requesting Provider: Maurice Small, MD Primary Care Provider:  Maurice Small, MD  CC:  tingling  HPI:  Terri Hudson is a 70 y.o. female here as requested by Maurice Small, MD for left lateral foot numbness. PMHx chronic kidney disease, osteoporosis, neuropathy, chronic fatigue, ulcerative colitis, migraine aura without headache, polymyalgia rheumatica, leukopenia.    Patient is here alone. States left foot numbness ongoing for about a year. More tingling. Rather than numbness across the ball of her foot. Left foot. The bottom of the foot. He has a hx of PMR and ulcerative colitis. She has been on many medications for other autoimmune diseases, been on steroids and others. Worsened over the last year but very slowly. No inciting events or new medications. No pain anywhere, feels weird like she has a piece of paper stuck on her foot. No back pain, no radicular symptoms, no ankle or knee pain or injuries. She had knee injury a long time ago, no time course relation. No weakness in the foot. No chemotherapy. No diabetes. No other focal neurologic deficits, associated symptoms, inciting events or modifiable factors.  Review of Systems: Patient complains of symptoms per HPI as well as the following symptoms: tingling. Pertinent negatives and positives per HPI. All others negative.   Social History   Socioeconomic History  . Marital status: Married    Spouse name: Not on file  . Number of children: 2  . Years of education: Not on file  . Highest education level: Bachelor's degree (e.g., BA, AB, BS)  Occupational History  . Not on file  Tobacco Use  . Smoking status: Never Smoker  . Smokeless tobacco: Never Used  Vaping Use  . Vaping Use: Never used  Substance and Sexual Activity  . Alcohol use: Yes    Alcohol/week: 7.0 standard drinks    Types: 7 Standard drinks or equivalent per week    Comment: 1 glass/day  . Drug use: Never   . Sexual activity: Not on file  Other Topics Concern  . Not on file  Social History Narrative   Lives at home with husband   Right handed   Caffeine: 2 cups/day   Social Determinants of Health   Financial Resource Strain: Not on file  Food Insecurity: Not on file  Transportation Needs: Not on file  Physical Activity: Not on file  Stress: Not on file  Social Connections: Not on file  Intimate Partner Violence: Not on file    Family History  Problem Relation Age of Onset  . Heart disease Mother   . Osteoporosis Father   . Barrett's esophagus Father   . Cancer Father   . Migraines Daughter   . Bowel Disease Son   . Colon cancer Neg Hx   . Colon polyps Neg Hx   . Esophageal cancer Neg Hx   . Stomach cancer Neg Hx   . Rectal cancer Neg Hx   . Neuropathy Neg Hx     Past Medical History:  Diagnosis Date  . Leukopenia   . Migraine   . Osteoarthritis   . Osteoporosis 02/12/2018  . Polymyalgia rheumatica ([place])   . Ulcerative colitis ([place])   . Ulcerative proctosigmoiditis [facility])     Patient Active Problem List   Diagnosis Date Noted  . Numbness and tingling of foot 07/28/2020  . Rheumatoid arthritis involving multiple sites with positive rheumatoid factor ([place]) 06/15/2020  . High risk medication use 11/20/2017  .  On prednisone therapy 11/20/2017  . Primary osteoarthritis of both knees 11/20/2017  . Spondylosis of lumbar spine 11/20/2017  . Ulcerative colitis ([facility]) 11/20/2017  . Abnormal laboratory test 11/20/2017  . Polymyalgia rheumatica ([facility]) 06/07/2017    Past Surgical History:  Procedure Laterality Date  . COLONOSCOPY    . HAND SURGERY Left    2015 pt reports cutting her hand accidentally and needing exploratory surgery to check for nerve damage  . KNEE SURGERY Right     Current Outpatient Medications  Medication Sig Dispense Refill  . alendronate (FOSAMAX) 70 MG tablet Take 70 mg by mouth once a week. Take with a full glass of water on an empty stomach.     . cholecalciferol (VITAMIN D3) 25 MCG (1000 UT) tablet Take 1,000 Units by mouth daily.    . folic acid (FOLVITE) 1 MG tablet TAKE 1 TABLET BY MOUTH EVERY DAY 90 tablet 1  . leflunomide (ARAVA) 10 MG tablet TAKE 1 TABLET BY MOUTH EVERY DAY 30 tablet 2  . naratriptan (AMERGE) 2.5 MG tablet Take 2.5 mg by mouth as needed for migraine. Take one (1) tablet at onset of headache; if returns or does not resolve, may repeat after 4 hours; do not exceed five (5) mg in 24 hours.    . predniSONE (DELTASONE) 1 MG tablet Take 4mg by mouth daily for 1 month, follow taper as instructed. (Patient taking differently: Take 1 mg by mouth daily with breakfast. Take 4mg by mouth daily for 1 month, follow taper as instructed.) 120 tablet 0  . sulfaSALAzine (AZULFIDINE) 500 MG tablet TAKE 2 TABLETS BY MOUTH TWICE A DAY 360 tablet 1  . SUMAtriptan (IMITREX) 20 MG/ACT nasal spray Place 20 mg into the nose as needed for migraine or headache. May repeat in 2 hours if headache persists or recurs. (Patient not taking: Reported on 07/28/2020)     No current facility-administered medications for this visit.    Allergies as of 07/28/2020  . (No Known Allergies)    Vitals: BP (!) 142/81 (BP Location: Right Arm, Patient Position: Sitting)   Pulse 78   Ht 5' 8" (1.727 m)   Wt 143 lb (64.9 kg)   BMI 21.74 kg/m  Last Weight:  Wt Readings from Last 1 Encounters:  07/28/20 143 lb (64.9 kg)   Last Height:   Ht Readings from Last 1 Encounters:  07/28/20 5' 8" (1.727 m)     Physical exam: Exam: Gen: NAD, conversant, well nourised, well groomed                     CV: RRR, no MRG. No Carotid Bruits. No peripheral edema, warm, nontender Eyes: Conjunctivae clear without exudates or hemorrhage  Neuro: Detailed Neurologic Exam  Speech:    Speech is normal; fluent and spontaneous with normal comprehension.  Cognition:    The patient is oriented to person, place, and time;     recent and remote memory intact;      language fluent;     normal attention, concentration,     fund of knowledge Cranial Nerves:    The pupils are equal, round, and reactive to light.pupils too small to visualize fundi. . Visual fields are full to finger confrontation. Extraocular movements are intact. Trigeminal sensation is intact and the muscles of mastication are normal. The face is symmetric. The palate elevates in the midline. Hearing intact. Voice is normal. Shoulder shrug is normal. The tongue has normal motion without fasciculations.     Coordination:    No dysmetria or ataxia   Gait:    Normal native gait  Motor Observation:    No asymmetry, no atrophy, and no involuntary movements noted. Tone:    Normal muscle tone.    Posture:    Posture is normal. normal erect    Strength:    Strength is V/V in the upper and lower limbs.      Sensation: intact to LT     Reflex Exam:  DTR's:    Absent AJs   Toes:    The toes are equivocal bilaterally.   Clonus:    Clonus is absent.    Assessment/Plan:  [age] year old with tingling on the bottom only of the foot from the arch to the bottom of the toes (just the bottom ball and arch of foot and only on the left). Unclear etiology, I am leaning more towards a local/mechanical factor and she may be better with a podiatrist or getting some insoles with good arch support. I will test her for systemic causes such as diabetes and b12 deficiency but it would be odd for these disorders to affect only one foot. Another possibility is L5/s1 nerve impingement but she denies any low back pain or radicular symptoms. She does have autoimmune disorders and these are know to cause polyneuropathy which is sometimes asymmetric but that is hard to prove without biopsy or emg/ncs and I don;t think her symptoms are severe enough where we would get any additional information from these 2 tests (sensation rest of the foot intact). If symptoms worsen or spread she is to contact us for emg/ncs but  podiatry may be best option to see if there are any mechanical/stress factors(she has broken her toes on that foot).  Orders Placed This Encounter  Procedures  . Hemoglobin A1c  . Vitamin B1  . Vitamin B6  . B12 and Folate Panel  . Methylmalonic acid, serum  . Multiple Myeloma Panel (SPEP&IFE w/QIG)     Cc: Maurice Small, MD,  Maurice Small, MD  Sarina Ill, MD  [facility] [address] [place], [place] [address]  Phone [phone] Fax [phone]  I spent 45 minutes of face-to-face and non-face-to-face time with patient on the  1. Numbness and tingling of foot    diagnosis.  This included previsit chart review, lab review, study review, order entry, electronic health record documentation, patient education on the different diagnostic and therapeutic options, counseling and coordination of care, risks and benefits of management, compliance, or risk factor reduction

## 2020-07-28 ENCOUNTER — Ambulatory Visit: Payer: PPO | Admitting: Neurology

## 2020-07-28 ENCOUNTER — Encounter: Payer: Self-pay | Admitting: Neurology

## 2020-07-28 VITALS — BP 142/81 | HR 78 | Ht 68.0 in | Wt 143.0 lb

## 2020-07-28 DIAGNOSIS — R200 Anesthesia of skin: Secondary | ICD-10-CM

## 2020-07-28 DIAGNOSIS — R202 Paresthesia of skin: Secondary | ICD-10-CM | POA: Diagnosis not present

## 2020-07-28 NOTE — Patient Instructions (Addendum)
Leaning towards more of a mechanical issue with the foot/pressure or stress on the nerve in the foot. Recommend podiatry. Blood work If symptoms worsen significantly or spread to the other foot recommend emg/ncs(see below)    Leory Plowman and Daroff's neurology in clinical practice (8th ed., pp. 8891- 1929). Elsevier."> Goldman-Cecil medicine (26th ed., pp. 2489- 2501). Elsevier.">  Peripheral Neuropathy Peripheral neuropathy is a type of nerve damage. It affects nerves that carry signals between the spinal cord and the arms, legs, and the rest of the body (peripheral nerves). It does not affect nerves in the spinal cord or brain. In peripheral neuropathy, one nerve or a group of nerves may be damaged. Peripheral neuropathy is a broad category that includes many specific nerve disorders, like diabetic neuropathy, hereditary neuropathy, and carpal tunnel syndrome. What are the causes? This condition may be caused by:  Diabetes. This is the most common cause of peripheral neuropathy.  Nerve injury.  Pressure or stress on a nerve that lasts a long time.  Lack (deficiency) of B vitamins. This can result from alcoholism, poor diet, or a restricted diet.  Infections.  Autoimmune diseases, such as rheumatoid arthritis and systemic lupus erythematosus.  Nerve diseases that are passed from parent to child (inherited).  Some medicines, such as cancer medicines (chemotherapy).  Poisonous (toxic) substances, such as lead and mercury.  Too little blood flowing to the legs.  Kidney disease.  Thyroid disease. In some cases, the cause of this condition is not known. What are the signs or symptoms? Symptoms of this condition depend on which of your nerves is damaged. Common symptoms include:  Loss of feeling (numbness) in the feet, hands, or both.  Tingling in the feet, hands, or both.  Burning pain.  Very sensitive skin.  Weakness.  Not being able to move a part of the body  (paralysis).  Muscle twitching.  Clumsiness or poor coordination.  Loss of balance.  Not being able to control your bladder.  Feeling dizzy.  Sexual problems. How is this diagnosed? Diagnosing and finding the cause of peripheral neuropathy can be difficult. Your health care provider will take your medical history and do a physical exam. A neurological exam will also be done. This involves checking things that are affected by your brain, spinal cord, and nerves (nervous system). For example, your health care provider will check your reflexes, how you move, and what you can feel. You may have other tests, such as:  Blood tests.  Electromyogram (EMG) and nerve conduction tests. These tests check nerve function and how well the nerves are controlling the muscles.  Imaging tests, such as CT scans or MRI to rule out other causes of your symptoms.  Removing a small piece of nerve to be examined in a lab (nerve biopsy).  Removing and examining a small amount of the fluid that surrounds the brain and spinal cord (lumbar puncture). How is this treated? Treatment for this condition may involve:  Treating the underlying cause of the neuropathy, such as diabetes, kidney disease, or vitamin deficiencies.  Stopping medicines that can cause neuropathy, such as chemotherapy.  Medicine to help relieve pain. Medicines may include: ? Prescription or over-the-counter pain medicine. ? Antiseizure medicine. ? Antidepressants. ? Pain-relieving patches that are applied to painful areas of skin.  Surgery to relieve pressure on a nerve or to destroy a nerve that is causing pain.  Physical therapy to help improve movement and balance.  Devices to help you move around (assistive devices). Follow these  instructions at home: Medicines  Take over-the-counter and prescription medicines only as told by your health care provider. Do not take any other medicines without first asking your health care  provider.  Do not drive or use heavy machinery while taking prescription pain medicine. Lifestyle  Do not use any products that contain nicotine or tobacco, such as cigarettes and e-cigarettes. Smoking keeps blood from reaching damaged nerves. If you need help quitting, ask your health care provider.  Avoid or limit alcohol. Too much alcohol can cause a vitamin B deficiency, and vitamin B is needed for healthy nerves.  Eat a healthy diet. This includes: ? Eating foods that are high in fiber, such as fresh fruits and vegetables, whole grains, and beans. ? Limiting foods that are high in fat and processed sugars, such as fried or sweet foods.   General instructions  If you have diabetes, work closely with your health care provider to keep your blood sugar under control.  If you have numbness in your feet: ? Check every day for signs of injury or infection. Watch for redness, warmth, and swelling. ? Wear padded socks and comfortable shoes. These help protect your feet.  Develop a good support system. Living with peripheral neuropathy can be stressful. Consider talking with a mental health specialist or joining a support group.  Use assistive devices and attend physical therapy as told by your health care provider. This may include using a walker or a cane.  Keep all follow-up visits as told by your health care provider. This is important.   Contact a health care provider if:  You have new signs or symptoms of peripheral neuropathy.  You are struggling emotionally from dealing with peripheral neuropathy.  Your pain is not well-controlled. Get help right away if:  You have an injury or infection that is not healing normally.  You develop new weakness in an arm or leg.  You have fallen or do so frequently. Summary  Peripheral neuropathy is when the nerves in the arms, or legs are damaged, resulting in numbness, weakness, or pain.  There are many causes of peripheral neuropathy,  including diabetes, pinched nerves, vitamin deficiencies, autoimmune disease, and hereditary conditions.  Diagnosing and finding the cause of peripheral neuropathy can be difficult. Your health care provider will take your medical history, do a physical exam, and do tests, including blood tests and nerve function tests.  Treatment involves treating the underlying cause of the neuropathy and taking medicines to help control pain. Physical therapy and assistive devices may also help. This information is not intended to replace advice given to you by your health care provider. Make sure you discuss any questions you have with your health care provider. Document Revised: 02/10/2020 Document Reviewed: 02/10/2020 Elsevier Patient Education  2021 [place] is a test to check how well your muscles and nerves are working. This procedure includes the combined use of electromyogram (EMG) and nerve conduction study (NCS). EMG is used to look for muscular disorders. NCS, which is also called electroneurogram, measures how well your nerves are controlling your muscles. The procedures are usually done together to check if your muscles and nerves are healthy. If the results of the tests are abnormal, this may indicate disease or injury, such as a neuromuscular disease or peripheral nerve damage. Tell a health care provider about:  Any allergies you have.  All medicines you are taking, including vitamins, herbs, eye drops, creams, and over-the-counter medicines.  Any problems you  or family members have had with anesthetic medicines.  Any blood disorders you have.  Any surgeries you have had.  Any medical conditions you have.  If you have a pacemaker.  Whether you are pregnant or may be pregnant. What are the risks? Generally, this is a safe procedure. However, problems may occur, including:  Infection where the electrodes were  inserted.  Bleeding. What happens before the procedure? Medicines Ask your health care provider about:  Changing or stopping your regular medicines. This is especially important if you are taking diabetes medicines or blood thinners.  Taking medicines such as aspirin and ibuprofen. These medicines can thin your blood. Do not take these medicines unless your health care provider tells you to take them.  Taking over-the-counter medicines, vitamins, herbs, and supplements. General instructions  Your health care provider may ask you to avoid: ? Beverages that have caffeine, such as coffee and tea. ? Any products that contain nicotine or tobacco. These products include cigarettes, e-cigarettes, and chewing tobacco. If you need help quitting, ask your health care provider.  Do not use lotions or creams on the same day that you will be having the procedure. What happens during the procedure? For EMG  Your health care provider will ask you to stay in a position so that he or she can access the muscle that will be studied. You may be standing, sitting, or lying down.  You may be given a medicine that numbs the area (local anesthetic).  A very thin needle that has an electrode will be inserted into your muscle.  Another small electrode will be placed on your skin near the muscle.  Your health care provider will ask you to continue to remain still.  The electrodes will send a signal that tells about the electrical activity of your muscles. You may see this on a monitor or hear it in the room.  After your muscles have been studied at rest, your health care provider will ask you to contract or flex your muscles. The electrodes will send a signal that tells about the electrical activity of your muscles.  Your health care provider will remove the electrodes and the electrode needles when the procedure is finished. The procedure may vary among health care providers and hospitals.   For  NCS  An electrode that records your nerve activity (recording electrode) will be placed on your skin by the muscle that is being studied.  An electrode that is used as a reference (reference electrode) will be placed near the recording electrode.  A paste or gel will be applied to your skin between the recording electrode and the reference electrode.  Your nerve will be stimulated with a mild shock. Your health care provider will measure how much time it takes for your muscle to react.  Your health care provider will remove the electrodes and the gel when the procedure is finished. The procedure may vary among health care providers and hospitals.   What happens after the procedure?  It is up to you to get the results of your procedure. Ask your health care provider, or the department that is doing the procedure, when your results will be ready.  Your health care provider may: ? Give you medicines for any pain. ? Monitor the insertion sites to make sure that bleeding stops. Summary  Electromyoneurogram is a test to check how well your muscles and nerves are working.  If the results of the tests are abnormal, this may indicate disease  or injury.  This is a safe procedure. However, problems may occur, such as bleeding and infection.  Your health care provider will do two tests to complete this procedure. One checks your muscles (EMG) and another checks your nerves (NCS).  It is up to you to get the results of your procedure. Ask your health care provider, or the department that is doing the procedure, when your results will be ready. This information is not intended to replace advice given to you by your health care provider. Make sure you discuss any questions you have with your health care provider. Document Revised: 01/15/2018 Document Reviewed: 12/28/2017 Elsevier Patient Education  [place].

## 2020-08-08 LAB — MULTIPLE MYELOMA PANEL, SERUM
Albumin SerPl Elph-Mcnc: 4.2 g/dL (ref 2.9–4.4)
Albumin/Glob SerPl: 1.6 (ref 0.7–1.7)
Alpha 1: 0.2 g/dL (ref 0.0–0.4)
Alpha2 Glob SerPl Elph-Mcnc: 0.6 g/dL (ref 0.4–1.0)
B-Globulin SerPl Elph-Mcnc: 0.8 g/dL (ref 0.7–1.3)
Gamma Glob SerPl Elph-Mcnc: 1 g/dL (ref 0.4–1.8)
Globulin, Total: 2.7 g/dL (ref 2.2–3.9)
IgA/Immunoglobulin A, Serum: 217 mg/dL (ref 87–352)
IgG (Immunoglobin G), Serum: 1069 mg/dL (ref 586–1602)
IgM (Immunoglobulin M), Srm: 120 mg/dL (ref 26–217)
Total Protein: 6.9 g/dL (ref 6.0–8.5)

## 2020-08-08 LAB — VITAMIN B6: Vitamin B6: 3.5 ug/L (ref 2.0–32.8)

## 2020-08-08 LAB — HEMOGLOBIN A1C
Est. average glucose Bld gHb Est-mCnc: 91 mg/dL
Hgb A1c MFr Bld: 4.8 % (ref 4.8–5.6)

## 2020-08-08 LAB — METHYLMALONIC ACID, SERUM: Methylmalonic Acid: 308 nmol/L (ref 0–378)

## 2020-08-08 LAB — B12 AND FOLATE PANEL
Folate: 18.6 ng/mL (ref >3.0)
Vitamin B-12: 417 pg/mL (ref 232–1245)

## 2020-08-08 LAB — VITAMIN B1: Thiamine: 109.9 nmol/L (ref 66.5–200.0)

## 2020-08-30 DIAGNOSIS — H2513 Age-related nuclear cataract, bilateral: Secondary | ICD-10-CM | POA: Diagnosis not present

## 2020-08-30 DIAGNOSIS — H25043 Posterior subcapsular polar age-related cataract, bilateral: Secondary | ICD-10-CM | POA: Diagnosis not present

## 2020-08-30 DIAGNOSIS — H35033 Hypertensive retinopathy, bilateral: Secondary | ICD-10-CM | POA: Diagnosis not present

## 2020-08-30 DIAGNOSIS — H25013 Cortical age-related cataract, bilateral: Secondary | ICD-10-CM | POA: Diagnosis not present

## 2020-08-30 DIAGNOSIS — H52213 Irregular astigmatism, bilateral: Secondary | ICD-10-CM | POA: Diagnosis not present

## 2020-08-30 DIAGNOSIS — H2511 Age-related nuclear cataract, right eye: Secondary | ICD-10-CM | POA: Diagnosis not present

## 2020-08-30 DIAGNOSIS — H524 Presbyopia: Secondary | ICD-10-CM | POA: Diagnosis not present

## 2020-09-24 NOTE — Progress Notes (Signed)
Office Visit Note  Patient: Terri Hudson             Date of Birth: 18-Dec-1950           MRN: [id number]             PCP: Maurice Small, MD Referring: Maurice Small, MD Visit Date: 10/05/2020 Occupation: @GUAROCC @  Subjective:  Medication monitoring.   History of Present Illness: Terri Hudson is a 70 y.o. female with history of PMR, rheumatoid arthritis, ulcerative colitis and osteopenia.  She denies any muscle weakness or tenderness.  She has not had a flare of rheumatoid arthritis.  She has been off prednisone for 2 months now.  She has been tolerating leflunomide and sulfasalazine combination well.  She states usually after getting off prednisone her ulcerative colitis flares.  She has not noticed flare of ulcerative colitis.  She had recent DEXA scan and wanted to discuss results today.  Activities of Daily Living:  Patient reports morning stiffness for 0 minutes.   Patient Denies nocturnal pain.  Difficulty dressing/grooming: Denies Difficulty climbing stairs: Denies Difficulty getting out of chair: Denies Difficulty using hands for taps, buttons, cutlery, and/or writing: Denies  Review of Systems  Constitutional: Negative for fatigue.  HENT: Negative for mouth sores, mouth dryness and nose dryness.   Eyes: Negative for pain, itching and dryness.  Respiratory: Negative for shortness of breath and difficulty breathing.   Cardiovascular: Negative for chest pain and palpitations.  Gastrointestinal: Negative for blood in stool, constipation and diarrhea.  Endocrine: Negative for increased urination.  Genitourinary: Negative for difficulty urinating.  Musculoskeletal: Negative for arthralgias, joint pain, joint swelling, myalgias, morning stiffness, muscle tenderness and myalgias.  Skin: Negative for color change, rash and redness.  Allergic/Immunologic: Negative for susceptible to infections.  Neurological: Positive for numbness. Negative for dizziness, headaches,  memory loss and weakness.  Hematological: Negative for bruising/bleeding tendency.  Psychiatric/Behavioral: Negative for confusion.    PMFS History:  Patient Active Problem List   Diagnosis Date Noted  . Numbness and tingling of foot 07/28/2020  . Rheumatoid arthritis involving multiple sites with positive rheumatoid factor ([place]) 06/15/2020  . High risk medication use 11/20/2017  . On prednisone therapy 11/20/2017  . Primary osteoarthritis of both knees 11/20/2017  . Spondylosis of lumbar spine 11/20/2017  . Ulcerative colitis ([place]) 11/20/2017  . Abnormal laboratory test 11/20/2017  . Polymyalgia rheumatica ([place]) 06/07/2017    Past Medical History:  Diagnosis Date  . Leukopenia   . Migraine   . Osteoarthritis   . Osteoporosis 02/12/2018  . Polymyalgia rheumatica ([place])   . Ulcerative colitis ([place])   . 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 Heart disease Mother   . Osteoporosis Father   . Barrett's esophagus Father   . Cancer Father   . Migraines Daughter   . Bowel Disease Son   . Colon cancer Neg Hx   . Colon polyps Neg Hx   . Esophageal cancer Neg Hx   . Stomach cancer Neg Hx   . Rectal cancer Neg Hx   . Neuropathy Neg Hx    Past Surgical History:  Procedure Laterality Date  . COLONOSCOPY    . HAND SURGERY Left    2015 pt reports cutting her hand accidentally and needing exploratory surgery to check for nerve damage  . KNEE SURGERY Right    Social History   Social History Narrative   Lives at home  with husband   Right handed   Caffeine: 2 cups/day   Immunization History  Administered Date(s) Administered  . Fluad Quad(high Dose 65+) 01/14/2019  . Influenza, High Dose Seasonal PF 03/06/2018, 01/28/2020  . PFIZER Comirnaty(Gray Top)Covid-19 Tri-Sucrose Vaccine 09/30/2020  . PFIZER(Purple Top)SARS-COV-2 Vaccination 06/04/2019, 06/25/2019, 02/05/2020     Objective: Vital Signs: BP (!) 150/87 (BP Location:  Left Arm, Patient Position: Sitting, Cuff Size: Normal)   Pulse 73   Resp 13   Ht 5' 8"  (1.727 m)   Wt 140 lb 3.2 oz (63.6 kg)   BMI 21.32 kg/m    Physical Exam Vitals and nursing note reviewed.  Constitutional:      Appearance: She is well-developed.  HENT:     Head: Normocephalic and atraumatic.  Eyes:     Conjunctiva/sclera: Conjunctivae normal.  Cardiovascular:     Rate and Rhythm: Normal rate and regular rhythm.     Heart sounds: Normal heart sounds.  Pulmonary:     Effort: Pulmonary effort is normal.     Breath sounds: Normal breath sounds.  Abdominal:     General: Bowel sounds are normal.     Palpations: Abdomen is soft.  Musculoskeletal:     Cervical back: Normal range of motion.  Lymphadenopathy:     Cervical: No cervical adenopathy.  Skin:    General: Skin is warm and dry.     Capillary Refill: Capillary refill takes less than 2 seconds.  Neurological:     Mental Status: She is alert and oriented to person, place, and time.  Psychiatric:        Behavior: Behavior normal.      Musculoskeletal Exam: C-spine was in good range of motion.  Shoulder joints, elbow joints, wrist joints, MCPs PIPs and DIPs with good range of motion with no synovitis.  Hip joints, knee joints, ankles, MTPs and PIPs with good range of motion with no synovitis.  She had no muscular weakness or tenderness.  CDAI Exam: CDAI Score: -- Patient Global: --; Provider Global: -- Swollen: --; Tender: -- Joint Exam 10/05/2020   No joint exam has been documented for this visit   There is currently no information documented on the homunculus. Go to the Rheumatology activity and complete the homunculus joint exam.  Investigation: No additional findings.  Imaging: No results found.  Recent Labs: Lab Results  Component Value Date   WBC 4.1 09/29/2020   HGB 12.5 09/29/2020   PLT 264 09/29/2020   NA 138 09/29/2020   K 4.1 09/29/2020   CL 105 09/29/2020   CO2 26 09/29/2020   GLUCOSE 128  (H) 09/29/2020   BUN 15 09/29/2020   CREATININE 0.88 09/29/2020   BILITOT 0.6 09/29/2020   ALKPHOS 84 08/26/2019   AST 19 09/29/2020   ALT 15 09/29/2020   PROT 6.5 09/29/2020   ALBUMIN 4.6 08/26/2019   CALCIUM 9.7 09/29/2020   GFRAA 79 09/29/2020   QFTBGOLDPLUS NEGATIVE 08/26/2019    Speciality Comments: No specialty comments available.  Procedures:  No procedures performed Allergies: Patient has no known allergies.   Assessment / Plan:     Visit Diagnoses: Polymyalgia rheumatica (HCC)-she has been off prednisone for 2 months now.  She denies any muscular weakness or tenderness.  She had good muscle strength on examination.  Rheumatoid arthritis involving multiple sites with positive rheumatoid factor (HCC)-she had no synovitis on examination.  She has been tolerating leflunomide and sulfasalazine combination well.  Other ulcerative colitis with other complication (HCC)-she denies any  flare of UC.  On prednisone therapy - She has been off prednisone since March 2022.  High risk medication use - Arava 20 mg daily and SSZ 500 mg 2 tablets BID.  CBC and CMP with GFR were normal on Sep 29, 2020.  We will continue to monitor labs every 3 months.  Have advised her to discontinue medications in case she develops an infection and resume medications when the infection resolves.  Updated information on the immunization was also given.  Primary osteoarthritis of both knees-she is currently not having any discomfort  Spondylosis of lumbar spine pain core strengthening exercises were emphasized.-  Osteopenia of multiple sites - March 9, 2022DXA lumbar spine BMD 0.885, T score -1.5,1% improvement compared to 2020.  She was on Fosamax for 1 year and then she stopped it in 06/2020.  Bone density results were reviewed with the patient.  She was on Fosamax because she was taking prednisone.  She stopped Fosamax about couple of months ago.  We will continue to monitor her bone density.  She will  emphasize on calcium rich diet, vitamin D and resistive exercises.  Vitamin D deficiency-she is on vitamin D supplement.  Elevated blood pressure reading-blood pressure reading is elevated today.  Have advised her to monitor blood pressure closely at home.  If her blood pressure reading stays elevated she should contact her PCP.  Orders: No orders of the defined types were placed in this encounter.  No orders of the defined types were placed in this encounter.    Follow-Up Instructions: Return in about 5 months (around 03/07/2021) for Rheumatoid arthritis.   Bo Merino, MD  Note - This record has been created using Editor, commissioning.  Chart creation errors have been sought, but may not always  have been located. Such creation errors do not reflect on  the standard of medical care.

## 2020-09-28 ENCOUNTER — Other Ambulatory Visit: Payer: Self-pay | Admitting: Physician Assistant

## 2020-09-28 NOTE — Telephone Encounter (Signed)
Next Visit: 10/05/2020  Last Visit: 06/15/2020  Last Fill: 07/06/2020  DX: Polymyalgia rheumatica   Current Dose per office note 06/15/2020, Arava 38m po qd  Labs: 06/15/2020, CBC is normal. Glucose is mildly elevated, probably not a fasting sample.  Okay to refill Arava?

## 2020-09-29 ENCOUNTER — Other Ambulatory Visit: Payer: Self-pay | Admitting: *Deleted

## 2020-09-29 DIAGNOSIS — Z79899 Other long term (current) drug therapy: Secondary | ICD-10-CM

## 2020-09-30 LAB — CBC WITH DIFFERENTIAL/PLATELET
Absolute Monocytes: 607 cells/uL (ref 200–950)
Basophils Absolute: 21 cells/uL (ref 0–200)
Basophils Relative: 0.5 %
Eosinophils Absolute: 21 cells/uL (ref 15–500)
Eosinophils Relative: 0.5 %
HCT: 38.7 % (ref 35.0–45.0)
Hemoglobin: 12.5 g/dL (ref 11.7–15.5)
Lymphs Abs: 1287 cells/uL (ref 850–3900)
MCH: 31 pg (ref 27.0–33.0)
MCHC: 32.3 g/dL (ref 32.0–36.0)
MCV: 96 fL (ref 80.0–100.0)
MPV: 10.7 fL (ref 7.5–12.5)
Monocytes Relative: 14.8 %
Neutro Abs: 2165 cells/uL (ref 1500–7800)
Neutrophils Relative %: 52.8 %
Platelets: 264 10*3/uL (ref 140–400)
RBC: 4.03 10*6/uL (ref 3.80–5.10)
RDW: 11.2 % (ref 11.0–15.0)
Total Lymphocyte: 31.4 %
WBC: 4.1 10*3/uL (ref 3.8–10.8)

## 2020-09-30 LAB — COMPLETE METABOLIC PANEL WITH GFR
AG Ratio: 2 (calc) (ref 1.0–2.5)
ALT: 15 U/L (ref 6–29)
AST: 19 U/L (ref 10–35)
Albumin: 4.3 g/dL (ref 3.6–5.1)
Alkaline phosphatase (APISO): 61 U/L (ref 37–153)
BUN: 15 mg/dL (ref 7–25)
CO2: 26 mmol/L (ref 20–32)
Calcium: 9.7 mg/dL (ref 8.6–10.4)
Chloride: 105 mmol/L (ref 98–110)
Creat: 0.88 mg/dL (ref 0.50–0.99)
GFR, Est African American: 79 mL/min/{1.73_m2} (ref >=60)
GFR, Est Non African American: 68 mL/min/{1.73_m2} (ref >=60)
Globulin: 2.2 g/dL (calc) (ref 1.9–3.7)
Glucose, Bld: 128 mg/dL — ABNORMAL HIGH (ref 65–99)
Potassium: 4.1 mmol/L (ref 3.5–5.3)
Sodium: 138 mmol/L (ref 135–146)
Total Bilirubin: 0.6 mg/dL (ref 0.2–1.2)
Total Protein: 6.5 g/dL (ref 6.1–8.1)

## 2020-09-30 NOTE — Progress Notes (Signed)
CBC is normal, CMP shows mildly elevated glucose probably not a fasting sample.

## 2020-10-05 ENCOUNTER — Ambulatory Visit: Payer: PPO | Admitting: Rheumatology

## 2020-10-05 ENCOUNTER — Other Ambulatory Visit: Payer: Self-pay

## 2020-10-05 ENCOUNTER — Encounter: Payer: Self-pay | Admitting: Rheumatology

## 2020-10-05 VITALS — BP 150/87 | HR 73 | Resp 13 | Ht 68.0 in | Wt 140.2 lb

## 2020-10-05 DIAGNOSIS — Z7952 Long term (current) use of systemic steroids: Secondary | ICD-10-CM | POA: Diagnosis not present

## 2020-10-05 DIAGNOSIS — M8589 Other specified disorders of bone density and structure, multiple sites: Secondary | ICD-10-CM | POA: Diagnosis not present

## 2020-10-05 DIAGNOSIS — K51818 Other ulcerative colitis with other complication: Secondary | ICD-10-CM | POA: Diagnosis not present

## 2020-10-05 DIAGNOSIS — E559 Vitamin D deficiency, unspecified: Secondary | ICD-10-CM | POA: Diagnosis not present

## 2020-10-05 DIAGNOSIS — M0579 Rheumatoid arthritis with rheumatoid factor of multiple sites without organ or systems involvement: Secondary | ICD-10-CM

## 2020-10-05 DIAGNOSIS — M47816 Spondylosis without myelopathy or radiculopathy, lumbar region: Secondary | ICD-10-CM | POA: Diagnosis not present

## 2020-10-05 DIAGNOSIS — R030 Elevated blood-pressure reading, without diagnosis of hypertension: Secondary | ICD-10-CM

## 2020-10-05 DIAGNOSIS — M353 Polymyalgia rheumatica: Secondary | ICD-10-CM

## 2020-10-05 DIAGNOSIS — M170 Bilateral primary osteoarthritis of knee: Secondary | ICD-10-CM | POA: Diagnosis not present

## 2020-10-05 DIAGNOSIS — Z79899 Other long term (current) drug therapy: Secondary | ICD-10-CM

## 2020-10-05 NOTE — Patient Instructions (Signed)
Standing Labs We placed an order today for your standing lab work.   Please have your standing labs drawn in August and every 3 months  If possible, please have your labs drawn 2 weeks prior to your appointment so that the provider can discuss your results at your appointment.  Please note that you may see your imaging and lab results in [place] [facility] before we have reviewed them. We may be awaiting multiple results to interpret others before contacting you. Please allow our office up to 72 hours to thoroughly review all of the results before contacting the office for clarification of your results.  We have open lab daily: Monday through Thursday from 1:30-4:30 PM and Friday from 1:30-4:00 PM at the office of Dr. Bo Merino, [facility].   Please be advised, all patients with office appointments requiring lab work will take precedent over walk-in lab work.  If possible, please come for your lab work on Monday and Friday afternoons, as you may experience shorter wait times. The office is located at [address], [place], [place], [place] [address] No appointment is necessary.   Labs are drawn by [facility]. Please bring your co-pay at the time of your lab draw.  You may receive a bill from [facility] for your lab work.  If you wish to have your labs drawn at another location, please call the office 24 hours in advance to send orders.  If you have any questions regarding directions or hours of operation,  please call [phone].   As a reminder, please drink plenty of water prior to coming for your lab work. Thanks!   If you have signs or symptoms of an infection or start antibiotics: . First, call your PCP for workup of your infection. . Hold your medication through the infection, until you complete your antibiotics, and until symptoms resolve if you take the following: o Injectable medication (Actemra, Benlysta, Cimzia, Cosentyx, Enbrel, Humira, Kevzara, Orencia, Remicade,  Simponi, Stelara, Taltz, Tremfya) o Methotrexate o Leflunomide (Arava) o Mycophenolate (Cellcept) o Morrie Sheldon, Olumiant, or Rinvoq  Vaccines You are taking a medication(s) that can suppress your immune system.  The following immunizations are recommended: . Flu annually . Covid-19  . Td/Tdap (tetanus, diphtheria, pertussis) every 10 years . Pneumonia (Prevnar 15 then Pneumovax 23 at least 1 year apart.  Alternatively, can take Prevnar 20 without needing additional dose) . Shingrix (after age 53): 2 doses from 4 weeks to 6 months apart  Please check with your PCP to make sure you are up to date.   Heart Disease Prevention   Your inflammatory disease increases your risk of heart disease which includes heart attack, stroke, atrial fibrillation (irregular heartbeats), high blood pressure, heart failure and atherosclerosis (plaque in the arteries).  It is important to reduce your risk by:   . Keep blood pressure, cholesterol, and blood sugar at healthy levels   . Smoking Cessation   . Maintain a healthy weight  o BMI 20-25   . Eat a healthy diet  o Plenty of fresh fruit, vegetables, and whole grains  o Limit saturated fats, foods high in sodium, and added sugars  o DASH and Mediterranean diet   . Increase physical activity  o Recommend moderate physically activity for 150 minutes per week/ 30 minutes a day for five days a week These can be broken up into three separate ten-minute sessions during the day.   . Reduce Stress  . Meditation, slow breathing exercises, yoga, coloring books  .  Dental visits twice a year   If you test POSITIVE for COVID19 and have MILD to MODERATE symptoms: o First, call your PCP if you would like to receive COVID19 treatment AND o Hold your medications during the infection and for at least 1 week after your symptoms have resolved: - Injectable medication (Benlysta, Cimzia, Cosentyx, Enbrel, Humira, Orencia, Remicade, Simponi, Stelara, Taltz,  Tremfya) - Methotrexate - Leflunomide (Arava) - Mycophenolate (Cellcept) - Morrie Sheldon, Olumiant, or Rinvoq o If you take Actemra or Kevzara, you DO NOT need to hold these for COVID19 infection.  If you test POSITIVE for COVID19 and have NO symptoms: o First, call your PCP if you would like to receive COVID19 treatment AND o Hold your medications for at least 10 days after the day that you tested positive - Injectable medication (Benlysta, Cimzia, Cosentyx, Enbrel, Humira, Orencia, Remicade, Simponi, Stelara, Taltz, Tremfya) - Methotrexate - Leflunomide (Arava) - Mycophenolate (Cellcept) - Morrie Sheldon, Olumiant, or Rinvoq

## 2020-10-19 DIAGNOSIS — H25811 Combined forms of age-related cataract, right eye: Secondary | ICD-10-CM | POA: Diagnosis not present

## 2020-10-19 DIAGNOSIS — H2511 Age-related nuclear cataract, right eye: Secondary | ICD-10-CM | POA: Diagnosis not present

## 2020-12-25 ENCOUNTER — Other Ambulatory Visit: Payer: Self-pay | Admitting: Rheumatology

## 2020-12-27 ENCOUNTER — Other Ambulatory Visit: Payer: Self-pay

## 2020-12-27 DIAGNOSIS — Z79899 Other long term (current) drug therapy: Secondary | ICD-10-CM

## 2020-12-27 LAB — CBC WITH DIFFERENTIAL/PLATELET
Absolute Monocytes: 675 cells/uL (ref 200–950)
Basophils Absolute: 32 cells/uL (ref 0–200)
Basophils Relative: 0.6 %
Eosinophils Absolute: 49 cells/uL (ref 15–500)
Eosinophils Relative: 0.9 %
HCT: 42.8 % (ref 35.0–45.0)
Hemoglobin: 13.8 g/dL (ref 11.7–15.5)
Lymphs Abs: 1334 cells/uL (ref 850–3900)
MCH: 31 pg (ref 27.0–33.0)
MCHC: 32.2 g/dL (ref 32.0–36.0)
MCV: 96.2 fL (ref 80.0–100.0)
MPV: 11.2 fL (ref 7.5–12.5)
Monocytes Relative: 12.5 %
Neutro Abs: 3310 cells/uL (ref 1500–7800)
Neutrophils Relative %: 61.3 %
Platelets: 296 10*3/uL (ref 140–400)
RBC: 4.45 10*6/uL (ref 3.80–5.10)
RDW: 11.3 % (ref 11.0–15.0)
Total Lymphocyte: 24.7 %
WBC: 5.4 10*3/uL (ref 3.8–10.8)

## 2020-12-27 LAB — COMPLETE METABOLIC PANEL WITH GFR
AG Ratio: 1.6 (calc) (ref 1.0–2.5)
ALT: 16 U/L (ref 6–29)
AST: 19 U/L (ref 10–35)
Albumin: 4.6 g/dL (ref 3.6–5.1)
Alkaline phosphatase (APISO): 61 U/L (ref 37–153)
BUN: 13 mg/dL (ref 7–25)
CO2: 28 mmol/L (ref 20–32)
Calcium: 10.4 mg/dL (ref 8.6–10.4)
Chloride: 103 mmol/L (ref 98–110)
Creat: 0.79 mg/dL (ref 0.50–1.05)
Globulin: 2.8 g/dL (calc) (ref 1.9–3.7)
Glucose, Bld: 151 mg/dL — ABNORMAL HIGH (ref 65–99)
Potassium: 4.2 mmol/L (ref 3.5–5.3)
Sodium: 139 mmol/L (ref 135–146)
Total Bilirubin: 0.7 mg/dL (ref 0.2–1.2)
Total Protein: 7.4 g/dL (ref 6.1–8.1)
eGFR: 82 mL/min/{1.73_m2} (ref >=60)

## 2020-12-27 NOTE — Telephone Encounter (Signed)
Next Visit: 03/08/2021  Last Visit: 10/05/2020  Last Fill: 09/28/2020  DX:  Polymyalgia rheumatica   Current Dose per office note 10/05/2020: Arava 20 mg daily  Labs: 09/29/2020 CBC is normal, CMP shows mildly elevated glucose probably not a fasting sample.  Left message to advise patient she is due to update labs.  Okay to refill Arava?

## 2020-12-28 NOTE — Progress Notes (Signed)
CBC and CMP are normal except glucose is elevated.  Please forward results to her PCP.

## 2021-01-14 ENCOUNTER — Other Ambulatory Visit: Payer: Self-pay | Admitting: Internal Medicine

## 2021-01-18 ENCOUNTER — Telehealth: Payer: Self-pay | Admitting: Internal Medicine

## 2021-01-18 MED ORDER — SULFASALAZINE 500 MG PO TABS: 1000.0000 mg | ORAL_TABLET | Freq: Two times a day (BID) | ORAL | 0 refills | Status: DC

## 2021-01-18 NOTE — Telephone Encounter (Signed)
Pt is requesting a rf for sulfaSALAzine. She just made a f/u app ton 02/16/21 with APP. Her pharmacy is [facility] on [address].

## 2021-01-18 NOTE — Telephone Encounter (Signed)
30 day supply has been sent to requested pharmacy. 90 day supply will be given at office visit.

## 2021-01-19 ENCOUNTER — Other Ambulatory Visit: Payer: Self-pay | Admitting: Rheumatology

## 2021-01-19 NOTE — Telephone Encounter (Signed)
Next Visit: 03/08/2021   Last Visit: 10/05/2020   Last Fill: 09/28/2020   DX:  Polymyalgia rheumatica    Current Dose per office note 10/05/2020: Arava 20 mg daily   Labs: 12/27/2020 CBC and CMP are normal except glucose is elevated.    Okay to refill Arava?

## 2021-02-02 ENCOUNTER — Other Ambulatory Visit: Payer: Self-pay | Admitting: Internal Medicine

## 2021-02-16 ENCOUNTER — Encounter: Payer: Self-pay | Admitting: Nurse Practitioner

## 2021-02-16 ENCOUNTER — Ambulatory Visit: Payer: PPO | Admitting: Nurse Practitioner

## 2021-02-16 VITALS — BP 130/72 | HR 76 | Ht 68.0 in | Wt 138.0 lb

## 2021-02-16 DIAGNOSIS — K51319 Ulcerative (chronic) rectosigmoiditis with unspecified complications: Secondary | ICD-10-CM | POA: Diagnosis not present

## 2021-02-16 MED ORDER — SULFASALAZINE 500 MG PO TABS: 1000.0000 mg | ORAL_TABLET | Freq: Two times a day (BID) | ORAL | 3 refills | Status: DC

## 2021-02-16 NOTE — Progress Notes (Signed)
02/16/2021 Shelita Steptoe [id number] 06-24-1950   Chief Complaint: Ulcerative colitis follow-up, medication refill  History of Present Illness: Terri Hudson. Metts is a 70 year old female with a past medical history of osteoporosis, migraine headaches, polymyalgia rheumatica, rheumatoid arthritis and ulcerative colitis.  She is followed by Dr. Hilarie Fredrickson.  She remains on Sulfasalazine 555m 2 tabs p.o. twice daily.  She reported having a minor flare of UC symptoms approximately 3 months ago which she described as passing nonbloody loose stools 2-3 times daily without abdominal pain or cramping which resolved after she took a Mesalamine 1 g suppository  PR for 2 to 3 weeks.  She is passing 1 or 2 normal formed bowel movements daily.  No rectal bleeding or black stools.  Her appetite is good.  Her weight is stable.  She denies having any upper or lower abdominal pain.  Her most recent colonoscopy was 07/17/2018 which identified proctosigmoid ulcerative colitis without dysplasia.  She was advised by Dr. PHilarie Fredricksonto repeat a colonoscopy in 5 years.  Her sister was diagnosed with Crohn's disease at the age of 687  Her son was diagnosed with Hirschsprung's as a newborn.  He received her COVID booster vaccination about 1 week ago.  She intends to get a flu shot in a few weeks.   Colonoscopy 07/17/2018: - Proctosigmoid ulcerative colitis. Inflammation was found from the rectum to the distal sigmoid colon (15-20 cm from dentate line to distal sigmoid). This was mild to moderate in severity. Biopsied. - Small internal hemorrhoids. - 5 year colonoscopy recall  - MILDLY ACTIVE CHRONIC PROCTOCOLITIS, CONSISTENT WITH PATIENT'S CLINICAL HISTORY OF ULCERATIVE COLITIS - GRANULOMAS ARE NOT IDENTIFIED - NEGATIVE FOR DYSPLASIA  CBC Latest Ref Rng & Units 12/27/2020 09/29/2020 06/15/2020  WBC 3.8 - 10.8 Thousand/uL 5.4 4.1 5.8  Hemoglobin 11.7 - 15.5 g/dL 13.8 12.5 13.6  Hematocrit 35.0 - 45.0 % 42.8 38.7 40.6   Platelets 140 - 400 Thousand/uL 296 264 314    CMP Latest Ref Rng & Units 12/27/2020 09/29/2020 07/28/2020  Glucose 65 - 99 mg/dL 151(H) 128(H) -  BUN 7 - 25 mg/dL 13 15 -  Creatinine 0.50 - 1.05 mg/dL 0.79 0.88 -  Sodium 135 - 146 mmol/L 139 138 -  Potassium 3.5 - 5.3 mmol/L 4.2 4.1 -  Chloride 98 - 110 mmol/L 103 105 -  CO2 20 - 32 mmol/L 28 26 -  Calcium 8.6 - 10.4 mg/dL 10.4 9.7 -  Total Protein 6.1 - 8.1 g/dL 7.4 6.5 6.9  Total Bilirubin 0.2 - 1.2 mg/dL 0.7 0.6 -  Alkaline Phos 39 - 117 U/L - - -  AST 10 - 35 U/L 19 19 -  ALT 6 - 29 U/L 16 15 -    Colonoscopy 07/17/2018: - Proctosigmoid ulcerative colitis. Inflammation was found from the rectum to the distal sigmoid colon (15-20 cm from dentate line to distal sigmoid). This was mild to moderate in severity. Biopsied. - Small internal hemorrhoids. - 5 year colonoscopy recall  - [place] CONSISTENT WITH PATIENT'S CLINICAL HISTORY OF ULCERATIVE COLITIS - GRANULOMAS ARE NOT IDENTIFIED - NEGATIVE FOR DYSPLASIA  Please let patiient know that biopsies from colon show chronic active proto-sigmoiditis or IBD confined to the very distal sigmoid and rectum There was no dysplasia or precancerous changes Patient with IBD confined to the distal sigmoid and rectum are NOT felt to be at increased risk of colon cancer. That said we do want to get this inflammation under control  and in remission. We are starting Rowasa and a course of Uceris. She will need OV in 2 months or so  Current Medications, Allergies, Past Medical History, Past Surgical History, Family History and Social History were reviewed in [employer] record.   Review of Systems:   Constitutional: Negative for fever, sweats, chills or weight loss.  Respiratory: Negative for shortness of breath.   Cardiovascular: Negative for chest pain, palpitations and leg swelling.  Gastrointestinal: See HPI.  Musculoskeletal: Negative for  back pain or muscle aches.  Neurological: Negative for dizziness, headaches or paresthesias.    Physical Exam: BP 130/72   Pulse 76   Ht 5' 8"  (1.727 m)   Wt 138 lb (62.6 kg)   BMI 20.98 kg/m  General: 70 year old female in no acute distress. Head: Normocephalic and atraumatic. Eyes: No scleral icterus. Conjunctiva pink . Ears: Normal auditory acuity. Mouth: Dentition intact. No ulcers or lesions.  Lungs: Clear throughout to auscultation. Heart: Regular rate and rhythm, no murmur. Abdomen: Soft, nontender and nondistended. No masses or hepatomegaly. Normal bowel sounds x 4 quadrants.  Rectal: Deferred. Musculoskeletal: Symmetrical with no gross deformities. Extremities: No edema. Neurological: Alert oriented x 4. No focal deficits.  Psychological: Alert and cooperative. Normal mood and affect  Assessment and Recommendations:  83) 70 year old female with history of ulcerative proctosigmoiditis.  She remains on sulfasalazine 500 mg 2 p.o. twice daily.  She previously declined azathioprine as her symptoms improved.  Biologics deferred.  She reported having a flare of UC approximately 3 months ago which resolved after she took Mesalamine 1 g suppository for 2 to 3 weeks. -Continue Sulfasalazine 500 mg 2 tabs p.o. twice daily -Patient to call our office if she develops UC flare symptoms -Flu shot recommended -Follow-up in the office in 1 year and as needed -Next colonoscopy due 07/2023  2) Polymyalgia rheumatica followed by rheumatology, off Prednisone since 07/2020  3) Osteopenia, vitamin D deficiency.  Last DEXA scan was 07/2020 which showed some improvement

## 2021-02-16 NOTE — Patient Instructions (Signed)
If you are age 68 or older, your body mass index should be between 23-30. Your Body mass index is 20.98 kg/m. If this is out of the aforementioned range listed, please consider follow up with your Primary Care Provider.  If you are age 21 or younger, your body mass index should be between 19-25. Your Body mass index is 20.98 kg/m. If this is out of the aformentioned range listed, please consider follow up with your Primary Care Provider.   The [place] providers would like to encourage you to use [facility] to communicate with providers for non-urgent requests or questions.  Due to long hold times on the telephone, sending your provider a message by [facility] may be faster and more efficient way to get a response. Please allow 48 business hours for a response.  Please remember that this is for non-urgent requests/questions.  We have refilled your medication for a year. A flu shot is recommended. Follow up in 1 year or as needed.  It was great seeing you today! Thank you for entrusting me with your care and choosing [facility].  Noralyn Pick, CRNP

## 2021-02-23 NOTE — Progress Notes (Signed)
Office Visit Note  Patient: Terri Hudson             Date of Birth: December 07, 1950           MRN: [id number]             PCP: Maurice Small, MD (Inactive) Referring: Maurice Small, MD Visit Date: 03/08/2021 Occupation: @GUAROCC @  Subjective:  Medication management   History of Present Illness: Terri Hudson is a 70 y.o. female with a history of PMR, rheumatoid arthritis and ulcerative colitis.  She states she is doing much better since she added to leflunomide.  She denies any joint pain or joint swelling.  She states she had a mild flare of ulcerative colitis which resolved.  She denies any muscular weakness or tenderness.  She has been off prednisone since March 2022.  Activities of Daily Living:  Patient reports morning stiffness for 0 minutes.   Patient Denies nocturnal pain.  Difficulty dressing/grooming: Denies Difficulty climbing stairs: Denies Difficulty getting out of chair: Denies Difficulty using hands for taps, buttons, cutlery, and/or writing: Denies  Review of Systems  Constitutional:  Negative for fatigue.  HENT:  Negative for mouth sores, mouth dryness and nose dryness.   Eyes:  Negative for pain, itching and dryness.  Respiratory:  Negative for shortness of breath and difficulty breathing.   Cardiovascular:  Negative for chest pain and palpitations.  Gastrointestinal:  Negative for blood in stool, constipation and diarrhea.  Endocrine: Negative for increased urination.  Genitourinary:  Negative for difficulty urinating.  Musculoskeletal:  Negative for joint pain, joint pain, joint swelling, myalgias, morning stiffness, muscle tenderness and myalgias.  Skin:  Negative for color change, rash and redness.  Allergic/Immunologic: Negative for susceptible to infections.  Neurological:  Positive for numbness. Negative for dizziness, headaches, memory loss and weakness.  Hematological:  Negative for bruising/bleeding tendency.  Psychiatric/Behavioral:  Negative  for confusion.    PMFS History:  Patient Active Problem List   Diagnosis Date Noted   Numbness and tingling of foot 07/28/2020   Rheumatoid arthritis involving multiple sites with positive rheumatoid factor ([place]) 06/15/2020   High risk medication use 11/20/2017   On prednisone therapy 11/20/2017   Primary osteoarthritis of both knees 11/20/2017   Spondylosis of lumbar spine 11/20/2017   Ulcerative colitis ([place]) 11/20/2017   Abnormal laboratory test 11/20/2017   Polymyalgia rheumatica ([place]) 06/07/2017    Past Medical History:  Diagnosis Date   Leukopenia    Migraine    Osteoarthritis    Osteoporosis 02/12/2018   Polymyalgia rheumatica ([place])    Ulcerative colitis ([place])    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[place])     Family History  Problem Relation Age of Onset   Heart disease Mother    Osteoporosis Father    Barrett's esophagus Father    Cancer Father    Heart disease Father    Migraines Daughter    Bowel Disease Son    Colon cancer Neg Hx    Colon polyps Neg Hx    Esophageal cancer Neg Hx    Stomach cancer Neg Hx    Rectal cancer Neg Hx    Neuropathy Neg Hx    Past Surgical History:  Procedure Laterality Date   COLONOSCOPY     HAND SURGERY Left    2015 pt reports cutting her hand accidentally and needing exploratory surgery to check for nerve damage   KNEE SURGERY Right    Social History   Social History Narrative   Lives at  home with husband   Right handed   Caffeine: 2 cups/day   Immunization History  Administered Date(s) Administered   Fluad Quad(high Dose 65+) 01/14/2019   Influenza, High Dose Seasonal PF 03/06/2018, 01/28/2020   PFIZER Comirnaty(Gray Top)Covid-19 Tri-Sucrose Vaccine 09/30/2020   PFIZER(Purple Top)SARS-COV-2 Vaccination 06/04/2019, 06/25/2019, 02/05/2020   Pfizer Covid-19 Vaccine Bivalent Booster 31yr & up 02/11/2021     Objective: Vital Signs: BP 138/82 (BP Location: Left Arm, Patient Position: Sitting, Cuff Size: Normal)   Pulse 76    Ht 5' 8"  (1.727 m)   Wt 138 lb (62.6 kg)   BMI 20.98 kg/m    Physical Exam Vitals and nursing note reviewed.  Constitutional:      Appearance: She is well-developed.  HENT:     Head: Normocephalic and atraumatic.  Eyes:     Conjunctiva/sclera: Conjunctivae normal.  Cardiovascular:     Rate and Rhythm: Normal rate and regular rhythm.     Heart sounds: Normal heart sounds.  Pulmonary:     Effort: Pulmonary effort is normal.     Breath sounds: Normal breath sounds.  Abdominal:     General: Bowel sounds are normal.     Palpations: Abdomen is soft.  Musculoskeletal:     Cervical back: Normal range of motion.  Lymphadenopathy:     Cervical: No cervical adenopathy.  Skin:    General: Skin is warm and dry.     Capillary Refill: Capillary refill takes less than 2 seconds.  Neurological:     Mental Status: She is alert and oriented to person, place, and time.  Psychiatric:        Behavior: Behavior normal.     Musculoskeletal Exam: C-spine was in good range of motion.  Shoulder joints, elbow joints, wrist joints, MCPs PIPs and DIPs with good range of motion with no synovitis.  Hip joints, knee joints, ankles, MTPs and PIPs with good range of motion with no synovitis.  CDAI Exam: CDAI Score: 0.2  Patient Global: 1 mm; Provider Global: 1 mm Swollen: 0 ; Tender: 0  Joint Exam 03/08/2021   No joint exam has been documented for this visit   There is currently no information documented on the homunculus. Go to the Rheumatology activity and complete the homunculus joint exam.  Investigation: No additional findings.  Imaging: No results found.  Recent Labs: Lab Results  Component Value Date   WBC 5.4 12/27/2020   HGB 13.8 12/27/2020   PLT 296 12/27/2020   NA 139 12/27/2020   K 4.2 12/27/2020   CL 103 12/27/2020   CO2 28 12/27/2020   GLUCOSE 151 (H) 12/27/2020   BUN 13 12/27/2020   CREATININE 0.79 12/27/2020   BILITOT 0.7 12/27/2020   ALKPHOS 84 08/26/2019   AST 19  12/27/2020   ALT 16 12/27/2020   PROT 7.4 12/27/2020   ALBUMIN 4.6 08/26/2019   CALCIUM 10.4 12/27/2020   GFRAA 79 09/29/2020   QFTBGOLDPLUS NEGATIVE 08/26/2019    Speciality Comments: No specialty comments available.  Procedures:  No procedures performed Allergies: Patient has no known allergies.   Assessment / Plan:     Visit Diagnoses: Polymyalgia rheumatica (HCC)-she had no muscular weakness or tenderness on my examination today.  She has been off prednisone since March 2022.  Rheumatoid arthritis involving multiple sites with positive rheumatoid factor ([place] -she had no joint swelling or inflammation.  She has been tolerating medications well.  Plan: Sedimentation rate  Other ulcerative colitis with other complication (HCC)-patient states that she  had a mild flare of ulcerative colitis which resolved.  She is on sulfasalazine.  History of chronic use of prednisone therapy - She has been off prednisone since March 2022.  High risk medication use - Arava 20 mg daily and SSZ 500 mg 2 tablets BID. -Tolerating well.  We will check labs today and then every 3 months to monitor for drug toxicity.  Plan: CBC with Differential/Platelet, COMPLETE METABOLIC PANEL WITH GFR.  Information regarding realization was placed in the AVS.  Immunization recommendations were discussed.  She was also advised to stop leflunomide in case she develops an infection and restart after infection resolves..  Primary osteoarthritis of both knees-she is doing well.  She had no warmth swelling or effusion on my examination.  Spondylosis of lumbar spine-she had good range of motion of her lumbar spine without any point tenderness.  Osteopenia of multiple sites - March 9, 2022DXA lumbar spine BMD 0.885, T score -1.5,1% improvement compared to 2020.  She was on Fosamax for 1 year and then she stopped it in 06/2020.   Vitamin D deficiency-she takes vitamin D 1000 units daily.  Orders: Orders Placed This Encounter   Procedures   CBC with Differential/Platelet   COMPLETE METABOLIC PANEL WITH GFR   Sedimentation rate    No orders of the defined types were placed in this encounter.    Follow-Up Instructions: Return in about 5 months (around 08/06/2021) for Rheumatoid arthritis.   Bo Merino, MD  Note - This record has been created using Editor, commissioning.  Chart creation errors have been sought, but may not always  have been located. Such creation errors do not reflect on  the standard of medical care.

## 2021-03-08 ENCOUNTER — Other Ambulatory Visit: Payer: Self-pay

## 2021-03-08 ENCOUNTER — Ambulatory Visit: Payer: PPO | Admitting: Rheumatology

## 2021-03-08 ENCOUNTER — Encounter: Payer: Self-pay | Admitting: Rheumatology

## 2021-03-08 VITALS — BP 138/82 | HR 76 | Ht 68.0 in | Wt 138.0 lb

## 2021-03-08 DIAGNOSIS — K51818 Other ulcerative colitis with other complication: Secondary | ICD-10-CM

## 2021-03-08 DIAGNOSIS — Z7952 Long term (current) use of systemic steroids: Secondary | ICD-10-CM

## 2021-03-08 DIAGNOSIS — M47816 Spondylosis without myelopathy or radiculopathy, lumbar region: Secondary | ICD-10-CM | POA: Diagnosis not present

## 2021-03-08 DIAGNOSIS — Z79899 Other long term (current) drug therapy: Secondary | ICD-10-CM

## 2021-03-08 DIAGNOSIS — M170 Bilateral primary osteoarthritis of knee: Secondary | ICD-10-CM

## 2021-03-08 DIAGNOSIS — M353 Polymyalgia rheumatica: Secondary | ICD-10-CM | POA: Diagnosis not present

## 2021-03-08 DIAGNOSIS — M0579 Rheumatoid arthritis with rheumatoid factor of multiple sites without organ or systems involvement: Secondary | ICD-10-CM | POA: Diagnosis not present

## 2021-03-08 DIAGNOSIS — M8589 Other specified disorders of bone density and structure, multiple sites: Secondary | ICD-10-CM | POA: Diagnosis not present

## 2021-03-08 DIAGNOSIS — E559 Vitamin D deficiency, unspecified: Secondary | ICD-10-CM

## 2021-03-08 NOTE — Patient Instructions (Signed)
Standing Labs We placed an order today for your standing lab work.   Please have your standing labs drawn in January  If possible, please have your labs drawn 2 weeks prior to your appointment so that the provider can discuss your results at your appointment.  Please note that you may see your imaging and lab results in [facility] before we have reviewed them. We may be awaiting multiple results to interpret others before contacting you. Please allow our office up to 72 hours to thoroughly review all of the results before contacting the office for clarification of your results.  We have open lab daily: Monday through Thursday from 1:30-4:30 PM and Friday from 1:30-4:00 PM at the office of Dr. Bo Merino, [facility].   Please be advised, all patients with office appointments requiring lab work will take precedent over walk-in lab work.  If possible, please come for your lab work on Monday and Friday afternoons, as you may experience shorter wait times. The office is located at [address], [place], [place], [place] [address] No appointment is necessary.   Labs are drawn by [facility]. Please bring your co-pay at the time of your lab draw.  You may receive a bill from [facility] for your lab work.  If you wish to have your labs drawn at another location, please call the office 24 hours in advance to send orders.  If you have any questions regarding directions or hours of operation,  please call [phone].   As a reminder, please drink plenty of water prior to coming for your lab work. Thanks!  Vaccines You are taking a medication(s) that can suppress your immune system.  The following immunizations are recommended: Flu annually Covid-19  Td/Tdap (tetanus, diphtheria, pertussis) every 10 years Pneumonia (Prevnar 15 then Pneumovax 23 at least 1 year apart.  Alternatively, can take Prevnar 20 without needing additional dose) Shingrix: 2 doses from 4 weeks to 6 months  apart  Please check with your PCP to make sure you are up to date.  If you have signs or symptoms of an infection or start antibiotics: First, call your PCP for workup of your infection. Hold your medication through the infection, until you complete your antibiotics, and until symptoms resolve if you take the following: Injectable medication (Actemra, Benlysta, Cimzia, Cosentyx, Enbrel, Humira, Kevzara, Orencia, Remicade, Simponi, Stelara, Taltz, Tremfya) Methotrexate Leflunomide (Arava) Mycophenolate (Cellcept) Morrie Sheldon, Olumiant, or Rinvoq

## 2021-03-09 LAB — COMPLETE METABOLIC PANEL WITH GFR
AG Ratio: 1.8 (calc) (ref 1.0–2.5)
ALT: 14 U/L (ref 6–29)
AST: 20 U/L (ref 10–35)
Albumin: 4.4 g/dL (ref 3.6–5.1)
Alkaline phosphatase (APISO): 57 U/L (ref 37–153)
BUN: 15 mg/dL (ref 7–25)
CO2: 25 mmol/L (ref 20–32)
Calcium: 9.9 mg/dL (ref 8.6–10.4)
Chloride: 103 mmol/L (ref 98–110)
Creat: 0.83 mg/dL (ref 0.50–1.05)
Globulin: 2.4 g/dL (calc) (ref 1.9–3.7)
Glucose, Bld: 92 mg/dL (ref 65–99)
Potassium: 4.2 mmol/L (ref 3.5–5.3)
Sodium: 137 mmol/L (ref 135–146)
Total Bilirubin: 0.6 mg/dL (ref 0.2–1.2)
Total Protein: 6.8 g/dL (ref 6.1–8.1)
eGFR: 77 mL/min/{1.73_m2} (ref >=60)

## 2021-03-09 LAB — CBC WITH DIFFERENTIAL/PLATELET
Absolute Monocytes: 826 cells/uL (ref 200–950)
Basophils Absolute: 22 cells/uL (ref 0–200)
Basophils Relative: 0.4 %
Eosinophils Absolute: 70 cells/uL (ref 15–500)
Eosinophils Relative: 1.3 %
HCT: 37.5 % (ref 35.0–45.0)
Hemoglobin: 12.4 g/dL (ref 11.7–15.5)
Lymphs Abs: 1291 cells/uL (ref 850–3900)
MCH: 31.5 pg (ref 27.0–33.0)
MCHC: 33.1 g/dL (ref 32.0–36.0)
MCV: 95.2 fL (ref 80.0–100.0)
MPV: 10.1 fL (ref 7.5–12.5)
Monocytes Relative: 15.3 %
Neutro Abs: 3191 cells/uL (ref 1500–7800)
Neutrophils Relative %: 59.1 %
Platelets: 280 10*3/uL (ref 140–400)
RBC: 3.94 10*6/uL (ref 3.80–5.10)
RDW: 11.2 % (ref 11.0–15.0)
Total Lymphocyte: 23.9 %
WBC: 5.4 10*3/uL (ref 3.8–10.8)

## 2021-03-09 NOTE — Progress Notes (Signed)
Addendum: Reviewed and agree with assessment and management plan. ,  M, MD  

## 2021-03-09 NOTE — Progress Notes (Signed)
CBC and CMP normal

## 2021-03-30 DIAGNOSIS — G43709 Chronic migraine without aura, not intractable, without status migrainosus: Secondary | ICD-10-CM | POA: Diagnosis not present

## 2021-03-30 DIAGNOSIS — R739 Hyperglycemia, unspecified: Secondary | ICD-10-CM | POA: Diagnosis not present

## 2021-03-30 DIAGNOSIS — Z0000 Encounter for general adult medical examination without abnormal findings: Secondary | ICD-10-CM | POA: Diagnosis not present

## 2021-03-30 DIAGNOSIS — M353 Polymyalgia rheumatica: Secondary | ICD-10-CM | POA: Diagnosis not present

## 2021-03-30 DIAGNOSIS — N183 Chronic kidney disease, stage 3 unspecified: Secondary | ICD-10-CM | POA: Diagnosis not present

## 2021-03-30 DIAGNOSIS — Z79899 Other long term (current) drug therapy: Secondary | ICD-10-CM | POA: Diagnosis not present

## 2021-03-30 DIAGNOSIS — H35033 Hypertensive retinopathy, bilateral: Secondary | ICD-10-CM | POA: Diagnosis not present

## 2021-03-30 DIAGNOSIS — Z1159 Encounter for screening for other viral diseases: Secondary | ICD-10-CM | POA: Diagnosis not present

## 2021-03-30 DIAGNOSIS — K5180 Other ulcerative colitis without complications: Secondary | ICD-10-CM | POA: Diagnosis not present

## 2021-03-30 DIAGNOSIS — D72819 Decreased white blood cell count, unspecified: Secondary | ICD-10-CM | POA: Diagnosis not present

## 2021-03-30 DIAGNOSIS — G629 Polyneuropathy, unspecified: Secondary | ICD-10-CM | POA: Diagnosis not present

## 2021-03-30 DIAGNOSIS — M8580 Other specified disorders of bone density and structure, unspecified site: Secondary | ICD-10-CM | POA: Diagnosis not present

## 2021-04-06 ENCOUNTER — Telehealth: Payer: Self-pay | Admitting: *Deleted

## 2021-04-06 NOTE — Telephone Encounter (Signed)
Labs received from:Dr. Jonathon Jordan   Drawn on:03/30/2021  Reviewed by:Hazel Sams, PA-C  Labs drawn:Hgb A1C, BMP, Vitamin B12, Vitamin D, Lipid Panel, HCV Ab  Results:Hgb A1C 4.4   Calcium 10.6   Cholesterol 222   HDLD 104  Calc LDl 107  Patient on Arava 10 mg daily.

## 2021-04-18 ENCOUNTER — Other Ambulatory Visit: Payer: Self-pay | Admitting: Physician Assistant

## 2021-04-18 NOTE — Telephone Encounter (Signed)
Next Visit: Due March 2023. Message sent to the front to schedule patient   Last Visit: 03/08/2021  Last Fill: 01/19/2021  DX: Polymyalgia rheumatica   Current Dose per office note 03/08/2021: Arava 20 mg daily   Labs: 03/08/2021 CBC and CMP normal  Okay to refill Arava?

## 2021-04-18 NOTE — Telephone Encounter (Signed)
Please schedule patient for a follow up visit. Patient due March 2023. Thanks!

## 2021-04-19 NOTE — Telephone Encounter (Signed)
[facility] for patient to call and schedule follow-up appointment in March 2023.

## 2021-05-06 ENCOUNTER — Other Ambulatory Visit: Payer: Self-pay | Admitting: Internal Medicine

## 2021-06-20 ENCOUNTER — Ambulatory Visit: Payer: PPO | Admitting: Neurology

## 2021-06-20 ENCOUNTER — Encounter: Payer: Self-pay | Admitting: Neurology

## 2021-06-20 ENCOUNTER — Other Ambulatory Visit: Payer: Self-pay

## 2021-06-20 VITALS — BP 158/92 | HR 80 | Ht 68.0 in | Wt 136.2 lb

## 2021-06-20 DIAGNOSIS — G629 Polyneuropathy, unspecified: Secondary | ICD-10-CM | POA: Insufficient documentation

## 2021-06-20 DIAGNOSIS — Z8349 Family history of other endocrine, nutritional and metabolic diseases: Secondary | ICD-10-CM | POA: Diagnosis not present

## 2021-06-20 NOTE — Patient Instructions (Addendum)
Small fiber neuropathy can be seen with autoimmune diseases, discuss with Rheumatology Also, father died of cardiac amyloid, need to check for familial amyloidosis(IFE/SPEP was normal)  Peripheral Neuropathy Peripheral neuropathy is a type of nerve damage. It affects nerves that carry signals between the spinal cord and the arms, legs, and the rest of the body (peripheral nerves). It does not affect nerves in the spinal cord or brain. In peripheral neuropathy, one nerve or a group of nerves may be damaged. Peripheral neuropathy is a broad category that includes many specific nerve disorders, like diabetic neuropathy, hereditary neuropathy, and carpal tunnel syndrome. What are the causes? This condition may be caused by: Diabetes. This is the most common cause of peripheral neuropathy. Nerve injury. Pressure or stress on a nerve that lasts a long time. Lack (deficiency) of B vitamins. This can result from alcoholism, poor diet, or a restricted diet. Infections. Autoimmune diseases, such as rheumatoid arthritis and systemic lupus erythematosus, ulcerative colitis. Nerve diseases that are passed from parent to child (inherited). Some medicines, such as cancer medicines (chemotherapy). Poisonous (toxic) substances, such as lead and mercury. Too little blood flowing to the legs. Kidney disease. Thyroid disease. In some cases, the cause of this condition is not known. What are the signs or symptoms? Symptoms of this condition depend on which of your nerves is damaged. Common symptoms include: Loss of feeling (numbness) in the feet, hands, or both. Tingling in the feet, hands, or both. Burning pain. Very sensitive skin. Weakness. Not being able to move a part of the body (paralysis). Muscle twitching. Clumsiness or poor coordination. Loss of balance. Not being able to control your bladder. Feeling dizzy. Sexual problems. How is this diagnosed? Diagnosing and finding the cause of  peripheral neuropathy can be difficult. Your health care provider will take your medical history and do a physical exam. A neurological exam will also be done. This involves checking things that are affected by your brain, spinal cord, and nerves (nervous system). For example, your health care provider will check your reflexes, how you move, and what you can feel. You may have other tests, such as: Blood tests. Electromyogram (EMG) and nerve conduction tests. These tests check nerve function and how well the nerves are controlling the muscles. Imaging tests, such as CT scans or MRI to rule out other causes of your symptoms. Removing a small piece of nerve to be examined in a lab (nerve biopsy). Removing and examining a small amount of the fluid that surrounds the brain and spinal cord (lumbar puncture). How is this treated? Treatment for this condition may involve: Treating the underlying cause of the neuropathy, such as diabetes, kidney disease, or vitamin deficiencies. Stopping medicines that can cause neuropathy, such as chemotherapy. Medicine to help relieve pain. Medicines may include: Prescription or over-the-counter pain medicine. Antiseizure medicine. Antidepressants. Pain-relieving patches that are applied to painful areas of skin. Surgery to relieve pressure on a nerve or to destroy a nerve that is causing pain. Physical therapy to help improve movement and balance. Devices to help you move around (assistive devices). Follow these instructions at home: Medicines Take over-the-counter and prescription medicines only as told by your health care provider. Do not take any other medicines without first asking your health care provider. Do not drive or use heavy machinery while taking prescription pain medicine. Lifestyle  Do not use any products that contain nicotine or tobacco, such as cigarettes and e-cigarettes. Smoking keeps blood from reaching damaged nerves. If you need help  quitting, ask your health care provider. Avoid or limit alcohol. Too much alcohol can cause a vitamin B deficiency, and vitamin B is needed for healthy nerves. Eat a healthy diet. This includes: Eating foods that are high in fiber, such as fresh fruits and vegetables, whole grains, and beans. Limiting foods that are high in fat and processed sugars, such as fried or sweet foods. General instructions  If you have diabetes, work closely with your health care provider to keep your blood sugar under control. If you have numbness in your feet: Check every day for signs of injury or infection. Watch for redness, warmth, and swelling. Wear padded socks and comfortable shoes. These help protect your feet. Develop a good support system. Living with peripheral neuropathy can be stressful. Consider talking with a mental health specialist or joining a support group. Use assistive devices and attend physical therapy as told by your health care provider. This may include using a walker or a cane. Keep all follow-up visits as told by your health care provider. This is important. Contact a health care provider if: You have new signs or symptoms of peripheral neuropathy. You are struggling emotionally from dealing with peripheral neuropathy. Your pain is not well-controlled. Get help right away if: You have an injury or infection that is not healing normally. You develop new weakness in an arm or leg. You have fallen or do so frequently. Summary Peripheral neuropathy is when the nerves in the arms, or legs are damaged, resulting in numbness, weakness, or pain. There are many causes of peripheral neuropathy, including diabetes, pinched nerves, vitamin deficiencies, autoimmune disease, and hereditary conditions. Diagnosing and finding the cause of peripheral neuropathy can be difficult. Your health care provider will take your medical history, do a physical exam, and do tests, including blood tests and nerve  function tests. Treatment involves treating the underlying cause of the neuropathy and taking medicines to help control pain. Physical therapy and assistive devices may also help. This information is not intended to replace advice given to you by your health care provider. Make sure you discuss any questions you have with your health care provider. Document Revised: 02/10/2020 Document Reviewed: 02/10/2020 Elsevier Patient Education  2022 [employer].

## 2021-06-20 NOTE — Progress Notes (Signed)
[facility]    Provider:  Dr Jaynee Eagles Requesting Provider: Dr. Ellene Route  Primary Care Provider:  Maurice Small, MD  CC:  tingling  Interval history 06/20/2021: She has numbness on the bottom of the feet, started on the left foot and now to the right foot, in the toes and the ball of the foot. Doesn't feel it with shoes on, only in at bed at night, bothers about worrying here it Is going, not painful but strange, like falling asleep. She does not have pain when walking or in the morning. Feels like a circulation problem, feet feel cold but Dr. Justin Mend feels like its circulation. No problems with back, no radicular symptoms. Father had amyloid protein. Father died of amyloidosis of the heart.   Pat testng hgba1c 4.8, b12/folate/b1 and b6 within normal limits, IFE nml  Patient complains of symptoms per HPI as well as the following symptoms: numbness and tinging in feet . Pertinent negatives and positives per HPI. All others negative   HPI:  Terri Hudson is a 71 y.o. female here as requested by No ref. provider found for left lateral foot numbness. PMHx chronic kidney disease, osteoporosis, neuropathy, chronic fatigue, ulcerative colitis, migraine aura without headache, polymyalgia rheumatica, leukopenia.    Patient is here alone. States left foot numbness ongoing for about a year. More tingling. Rather than numbness across the ball of her foot. Left foot. The bottom of the foot. He has a hx of PMR and ulcerative colitis. She has been on many medications for other autoimmune diseases, been on steroids and others. Worsened over the last year but very slowly. No inciting events or new medications. No pain anywhere, feels weird like she has a piece of paper stuck on her foot. No back pain, no radicular symptoms, no ankle or knee pain or injuries. She had knee injury a long time ago, no time course relation. No weakness in the foot. No chemotherapy. No diabetes.    Review of  Systems: Patient complains of symptoms per HPI as well as the following symptoms: tingling. Pertinent negatives and positives per HPI. All others negative.   Social History   Socioeconomic History   Marital status: Married    Spouse name: Not on file   Number of children: 2   Years of education: Not on file   Highest education level: Bachelor's degree (e.g., BA, AB, BS)  Occupational History   Not on file  Tobacco Use   Smoking status: Never   Smokeless tobacco: Never  Vaping Use   Vaping Use: Never used  Substance and Sexual Activity   Alcohol use: Yes    Alcohol/week: 7.0 standard drinks    Types: 7 Standard drinks or equivalent per week    Comment: 1 glass/day   Drug use: Never   Sexual activity: Not on file  Other Topics Concern   Not on file  Social History Narrative   Lives at home with husband   Right handed   Caffeine: 2 cups/day   Social Determinants of Health   Financial Resource Strain: Not on file  Food Insecurity: Not on file  Transportation Needs: Not on file  Physical Activity: Not on file  Stress: Not on file  Social Connections: Not on file  Intimate Partner Violence: Not on file    Family History  Problem Relation Age of Onset   Heart disease Mother    Osteoporosis Father    Barrett's esophagus Father    Cancer Father  Heart disease Father    Migraines Daughter    Bowel Disease Son    Colon cancer Neg Hx    Colon polyps Neg Hx    Esophageal cancer Neg Hx    Stomach cancer Neg Hx    Rectal cancer Neg Hx    Neuropathy Neg Hx     Past Medical History:  Diagnosis Date   Leukopenia    Migraine    Osteoarthritis    Osteoporosis 02/12/2018   Polymyalgia rheumatica ([place])    Ulcerative colitis ([place])    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[place])     Patient Active Problem List   Diagnosis Date Noted   Small fiber neuropathy 06/20/2021   Family history of amyloidosis 06/20/2021   Numbness and tingling of foot 07/28/2020   Rheumatoid  arthritis involving multiple sites with positive rheumatoid factor ([place]) 06/15/2020   High risk medication use 11/20/2017   On prednisone therapy 11/20/2017   Primary osteoarthritis of both knees 11/20/2017   Spondylosis of lumbar spine 11/20/2017   Ulcerative colitis ([place]) 11/20/2017   Abnormal laboratory test 11/20/2017   Polymyalgia rheumatica ([place]) 06/07/2017    Past Surgical History:  Procedure Laterality Date   COLONOSCOPY     HAND SURGERY Left    2015 pt reports cutting her hand accidentally and needing exploratory surgery to check for nerve damage   KNEE SURGERY Right     Current Outpatient Medications  Medication Sig Dispense Refill   cholecalciferol (VITAMIN D3) 25 MCG (1000 UT) tablet Take 1,000 Units by mouth daily.     folic acid (FOLVITE) 1 MG tablet TAKE 1 TABLET BY MOUTH EVERY DAY 90 tablet 0   leflunomide (ARAVA) 10 MG tablet TAKE 1 TABLET BY MOUTH EVERY DAY 90 tablet 0   naratriptan (AMERGE) 2.5 MG tablet Take 2.5 mg by mouth as needed for migraine. Take one (1) tablet at onset of headache; if returns or does not resolve, may repeat after 4 hours; do not exceed five (5) mg in 24 hours.     sulfaSALAzine (AZULFIDINE) 500 MG tablet Take 2 tablets (1,000 mg total) by mouth 2 (two) times daily. 360 tablet 3   SUMAtriptan (IMITREX) 20 MG/ACT nasal spray Place 20 mg into the nose as needed for migraine or headache. May repeat in 2 hours if headache persists or recurs.     No current facility-administered medications for this visit.    Allergies as of 06/20/2021   (No Known Allergies)    Vitals: BP (!) 158/92    Pulse 80    Ht 5' 8"  (1.727 m)    Wt 136 lb 3.2 oz (61.8 kg)    BMI 20.71 kg/m  Last Weight:  Wt Readings from Last 1 Encounters:  06/20/21 136 lb 3.2 oz (61.8 kg)   Last Height:   Ht Readings from Last 1 Encounters:  06/20/21 5' 8"  (1.727 m)     Physical exam: stable Exam: Gen: NAD, conversant, well nourised, well groomed                     CV:  RRR, no MRG. No Carotid Bruits. No peripheral edema, warm, nontender Eyes: Conjunctivae clear without exudates or hemorrhage  Neuro: stable Detailed Neurologic Exam  Speech:    Speech is normal; fluent and spontaneous with normal comprehension.  Cognition:    The patient is oriented to person, place, and time;     recent and remote memory intact;     language fluent;  normal attention, concentration,     fund of knowledge Cranial Nerves:    The pupils are equal, round, and reactive to light.pupils too small to visualize fundi. . Visual fields are full to finger confrontation. Extraocular movements are intact. Trigeminal sensation is intact and the muscles of mastication are normal. The face is symmetric. The palate elevates in the midline. Hearing intact. Voice is normal. Shoulder shrug is normal. The tongue has normal motion without fasciculations.   Coordination:    No dysmetria or ataxia   Gait:    Normal native gait  Motor Observation:    No asymmetry, no atrophy, and no involuntary movements noted. Tone:    Normal muscle tone.    Posture:    Posture is normal. normal erect    Strength:    Strength is V/V in the upper and lower limbs.      Sensation: intact to LT, pin prick, vibration distally     Reflex Exam:  DTR's:    Absent AJs   Toes:    The toes are equivocal bilaterally.   Clonus:    Clonus is absent.    Assessment/Plan:  22 71 year old with tingling on the bottom only of the foot from the arch to the bottom of the toes (just the bottom ball and arch of foot and only on the left) and now on the right foot. Mild, sensory exam is normal, testing included hgba1c 4.8, b12/folate/b1 and b6 within normal limits, IFE nml Another possibility is L5/s1 nerve impingement but she denies any low back pain or radicular symptoms. She does have autoimmune disorders and these are know to cause small-fiber polyneuropathy. I don't think her symptoms are severe enough  where we would get any additional information from emg/ncs which does not detect small-fiber neuropathy. Father had cardiac amyloidosis, we will check her for familial amyloidosis with genetic testing.   Cc:  Maurice Small, MD  Sarina Ill, MD  [facility] [address] [place], [place] [address]  Phone [phone] Fax [phone]  I spent 30 minutes of face-to-face and non-face-to-face time with patient on the  1. Small fiber neuropathy   2. Family history of amyloidosis    diagnosis.  This included previsit chart review, lab review, study review, order entry, electronic health record documentation, patient education on the different diagnostic and therapeutic options, counseling and coordination of care, risks and benefits of management, compliance, or risk factor reduction

## 2021-06-21 ENCOUNTER — Telehealth: Payer: Self-pay | Admitting: *Deleted

## 2021-06-21 NOTE — Telephone Encounter (Signed)
[employer] form completed. Dr. Jaynee Eagles to sign then fax to [facility].

## 2021-07-13 NOTE — Progress Notes (Signed)
Office Visit Note  Patient: Terri Hudson             Date of Birth: Nov 13, 1950           MRN: [id number]             PCP: Shirlean Mylar, MD Referring: No ref. provider found Visit Date: 07/26/2021 Occupation: @GUAROCC @  Subjective:  Medication monitoring   History of Present Illness: Terri Hudson is a 71 y.o. female with history of PMR and rheumatoid arthritis. She is taking arava 10 mg 1 tablet daily and sulfasalazine 500 mg 2 tablets by mouth twice daily.  She is tolerating both medications without any side effects and has not missed any doses recently.  She denies any signs or symptoms of a polymyalgia rheumatica flare or rheumatoid arthritis flare recently.  She is not experiencing any joint pain or joint swelling at this time.  She has no difficulty raising her arms above her head or rising from a seated position.  She has been going to the gym at least 3 days a week for exercise.  She denies any recent infections.  She denies any signs or symptoms of an ulcerative colitis flare.  Her last follow-up visit with Dr. Rhea Belton was in October 2022.  She states she is having some increased numbness in both feet and has been following up with her neurologist for further evaluation and management.  She currently has lab work pending per patient.    Activities of Daily Living:  Patient reports morning stiffness for 0 minutes.   Patient Denies nocturnal pain.  Difficulty dressing/grooming: Denies Difficulty climbing stairs: Denies Difficulty getting out of chair: Denies Difficulty using hands for taps, buttons, cutlery, and/or writing: Denies  Review of Systems  Constitutional:  Negative for fatigue.  HENT:  Negative for mouth sores, mouth dryness and nose dryness.   Eyes:  Negative for pain, itching and dryness.  Respiratory:  Negative for shortness of breath and difficulty breathing.   Cardiovascular:  Negative for chest pain and palpitations.  Gastrointestinal:  Negative  for blood in stool, constipation and diarrhea.  Endocrine: Negative for increased urination.  Genitourinary:  Negative for difficulty urinating.  Musculoskeletal:  Negative for joint pain, joint pain, joint swelling, myalgias, morning stiffness, muscle tenderness and myalgias.  Skin:  Negative for color change, rash and redness.  Allergic/Immunologic: Negative for susceptible to infections.  Neurological:  Positive for numbness. Negative for dizziness, headaches, memory loss and weakness.  Hematological:  Negative for bruising/bleeding tendency.  Psychiatric/Behavioral:  Negative for confusion.    PMFS History:  Patient Active Problem List   Diagnosis Date Noted   Small fiber neuropathy 06/20/2021   Family history of amyloidosis 06/20/2021   Numbness and tingling of foot 07/28/2020   Rheumatoid arthritis involving multiple sites with positive rheumatoid factor (HCC) 06/15/2020   High risk medication use 11/20/2017   On prednisone therapy 11/20/2017   Primary osteoarthritis of both knees 11/20/2017   Spondylosis of lumbar spine 11/20/2017   Ulcerative colitis (HCC) 11/20/2017   Abnormal laboratory test 11/20/2017   Polymyalgia rheumatica (HCC) 06/07/2017    Past Medical History:  Diagnosis Date   Leukopenia    Migraine    Osteoarthritis    Osteoporosis 02/12/2018   Polymyalgia rheumatica (HCC)    Ulcerative colitis (HCC)    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HCC)     Family History  Problem Relation Age of Onset   Heart disease Mother    Osteoporosis Father  Barrett's esophagus Father    Cancer Father    Heart disease Father    Migraines Daughter    Bowel Disease Son    Colon cancer Neg Hx    Colon polyps Neg Hx    Esophageal cancer Neg Hx    Stomach cancer Neg Hx    Rectal cancer Neg Hx    Neuropathy Neg Hx    Past Surgical History:  Procedure Laterality Date   COLONOSCOPY     HAND SURGERY Left    2015 pt reports cutting her hand accidentally and needing  exploratory surgery to check for nerve damage   KNEE SURGERY Right    Social History   Social History Narrative   Lives at home with husband   Right handed   Caffeine: 2 cups/day   Immunization History  Administered Date(s) Administered   Fluad Quad(high Dose 65+) 01/14/2019   Influenza, High Dose Seasonal PF 03/06/2018, 01/28/2020, 03/04/2021   PFIZER Comirnaty(Gray Top)Covid-19 Tri-Sucrose Vaccine 09/30/2020   PFIZER(Purple Top)SARS-COV-2 Vaccination 06/04/2019, 06/25/2019, 02/05/2020   Pfizer Covid-19 Vaccine Bivalent Booster 78yrs & up 02/11/2021     Objective: Vital Signs: BP (!) 150/81 (BP Location: Left Arm, Patient Position: Sitting, Cuff Size: Normal)   Pulse 84   Ht 5\' 8"  (1.727 m)   Wt 136 lb (61.7 kg)   BMI 20.68 kg/m    Physical Exam Vitals and nursing note reviewed.  Constitutional:      Appearance: She is well-developed.  HENT:     Head: Normocephalic and atraumatic.  Eyes:     Conjunctiva/sclera: Conjunctivae normal.  Cardiovascular:     Rate and Rhythm: Normal rate and regular rhythm.     Heart sounds: Normal heart sounds.  Pulmonary:     Effort: Pulmonary effort is normal.     Breath sounds: Normal breath sounds.  Abdominal:     General: Bowel sounds are normal.     Palpations: Abdomen is soft.  Musculoskeletal:     Cervical back: Normal range of motion.  Lymphadenopathy:     Cervical: No cervical adenopathy.  Skin:    General: Skin is warm and dry.     Capillary Refill: Capillary refill takes less than 2 seconds.  Neurological:     Mental Status: She is alert and oriented to person, place, and time.  Psychiatric:        Behavior: Behavior normal.     Musculoskeletal Exam: C-spine, thoracic spine, and lumbar spine good ROM.  Shoulder joints, elbow joints, wrist joints, MCPs, PIPs, and DIPs good ROM with no synovitis. DIP prominence consistent with OA of both hands. Complete fist formation bilaterally.  Hip joints, knee joints, ankle joints,  and MTP joints have good ROM with no discomfort. No warmth or effusion of knee joints. No tenderness or swelling of ankle joints. No tenderness or synovitis over MTP joints.   CDAI Exam: CDAI Score: -- Patient Global: --; Provider Global: -- Swollen: --; Tender: -- Joint Exam 07/26/2021   No joint exam has been documented for this visit   There is currently no information documented on the homunculus. Go to the Rheumatology activity and complete the homunculus joint exam.  Investigation: No additional findings.  Imaging: No results found.  Recent Labs: Lab Results  Component Value Date   WBC 5.4 03/08/2021   HGB 12.4 03/08/2021   PLT 280 03/08/2021   NA 137 03/08/2021   K 4.2 03/08/2021   CL 103 03/08/2021   CO2 25 03/08/2021   GLUCOSE  92 03/08/2021   BUN 15 03/08/2021   CREATININE 0.83 03/08/2021   BILITOT 0.6 03/08/2021   ALKPHOS 84 08/26/2019   AST 20 03/08/2021   ALT 14 03/08/2021   PROT 6.8 03/08/2021   ALBUMIN 4.6 08/26/2019   CALCIUM 9.9 03/08/2021   GFRAA 79 09/29/2020   QFTBGOLDPLUS NEGATIVE 08/26/2019    Speciality Comments: No specialty comments available.  Procedures:  No procedures performed Allergies: Patient has no known allergies.    Assessment / Plan:     Visit Diagnoses: Polymyalgia rheumatica (HCC): In remission.  She has been off of prednisone since March 2022 with no recurrence of symptoms.  She has not had any signs or symptoms of a polymyalgia rheumatica flare.  She has no difficulty raising her arms above her head or rising from a seated position.  She has been going to the gym at least 3 days a week for exercise.  She was advised to notify us if she develops any new or worsening symptoms.  She will follow-up in the office in 5 months.  Rheumatoid arthritis involving multiple sites with positive rheumatoid factor (HCC): She has no joint tenderness or synovitis on examination today.  She has not had any signs or symptoms of a rheumatoid  arthritis flare.  She is not experiencing any joint pain, morning stiffness, nocturnal discomfort, or difficulty with ADLs.  She is clinically doing well taking Arava 10 mg 1 tablet by mouth daily and sulfasalazine 500 mg 2 tablets by mouth twice daily.  She is tolerating both medications without any side effects and has not missed any doses recently.  She has not had any recent infections.  She has been exercising at least 3 days a week without difficulty.  She will remain on combination therapy as prescribed.  She was advised to notify us if she develops increased joint pain or joint swelling.  Other ulcerative colitis with other complication [facility]): Followed by Dr. Rhea Belton.  Reviewed the office visit note from 02/16/2021.  She has not had any signs or symptoms of an ulcerative colitis flare.  She will remain on the current treatment regimen.  She was advised to notify us if she develops signs or symptoms of a flare.  On prednisone therapy - History of chronic use of prednisone therapy - She has been off prednisone since March 2022.  High risk medication use - Arava 10 mg daily and SSZ 500 mg 2 tablets BID. CBC and CMP updated on 03/08/21.   BMP updated on 03/30/21.  CBC and CMP were updated today.   Her next lab work will be due in June and every 3 months.   - Plan: CBC with Differential/Platelet, COMPLETE METABOLIC PANEL WITH GFR She has not had any recent infections.  Discussed the importance of holding Arava and sulfasalazine if she develops signs or symptoms of an infection and to resume once the infection is completely cleared.  Primary osteoarthritis of both knees: She has good range of motion of both knee joints with no discomfort.  No warmth or effusion was noted.  She has been exercising 3 days a week without difficulty.  Spondylosis of lumbar spine: She is not experiencing any discomfort in her lower back at this time.  No midline spinal tenderness was noted.  No symptoms of  radiculopathy.  Osteopenia of multiple sites - July 21, 2020: DXA lumbar spine BMD 0.885, T score -1.5,1% improvement compared to 2020.  No recent falls or fractures.  She was on Fosamax for  1 year and then she stopped it in 06/2020.  She has not been taking a vitamin D supplement recently.  Vitamin D level rechecked today.  Her next bone density will be due in March 2024.- Plan: VITAMIN D 25 Hydroxy (Vit-D Deficiency, Fractures)  Vitamin D deficiency - She history of vitamin D deficiency.  She has not been taking a vitamin D supplement recently.  Vitamin D level rechecked today.  Plan: VITAMIN D 25 Hydroxy (Vit-D Deficiency, Fractures)  Orders: Orders Placed This Encounter  Procedures   CBC with Differential/Platelet   COMPLETE METABOLIC PANEL WITH GFR   VITAMIN D 25 Hydroxy (Vit-D Deficiency, Fractures)   Meds ordered this encounter  Medications   sulfaSALAzine (AZULFIDINE) 500 MG tablet    Sig: Take 2 tablets (1,000 mg total) by mouth 2 (two) times daily.    Dispense:  360 tablet    Refill:  0   leflunomide (ARAVA) 10 MG tablet    Sig: Take 1 tablet (10 mg total) by mouth daily.    Dispense:  90 tablet    Refill:  0    Follow-Up Instructions: Return in about 5 months (around 12/26/2021) for Polymyalgia Rheumatica, Rheumatoid arthritis.   Gearldine Bienenstock, PA-C  Note - This record has been created using Dragon software.  Chart creation errors have been sought, but may not always  have been located. Such creation errors do not reflect on  the standard of medical care.

## 2021-07-17 ENCOUNTER — Other Ambulatory Visit: Payer: Self-pay | Admitting: Physician Assistant

## 2021-07-18 NOTE — Telephone Encounter (Signed)
Next Visit: 07/26/2021 ?  ?Last Visit: 03/08/2021 ?  ?Last Fill: 01/19/2021 ?  ?DX: Polymyalgia rheumatica  ?  ?Current Dose per office note 03/08/2021: Arava 20 mg daily  ?  ?Labs: 03/08/2021 CBC and CMP normal ? ?Patient will update her labs at her appointment on 07/26/2021 ?  ?Okay to refill Arava?  ?

## 2021-07-25 DIAGNOSIS — Z1231 Encounter for screening mammogram for malignant neoplasm of breast: Secondary | ICD-10-CM | POA: Diagnosis not present

## 2021-07-26 ENCOUNTER — Encounter: Payer: Self-pay | Admitting: Physician Assistant

## 2021-07-26 ENCOUNTER — Ambulatory Visit: Payer: PPO | Admitting: Physician Assistant

## 2021-07-26 ENCOUNTER — Other Ambulatory Visit: Payer: Self-pay

## 2021-07-26 VITALS — BP 150/81 | HR 84 | Ht 68.0 in | Wt 136.0 lb

## 2021-07-26 DIAGNOSIS — M353 Polymyalgia rheumatica: Secondary | ICD-10-CM

## 2021-07-26 DIAGNOSIS — Z79899 Other long term (current) drug therapy: Secondary | ICD-10-CM

## 2021-07-26 DIAGNOSIS — M0579 Rheumatoid arthritis with rheumatoid factor of multiple sites without organ or systems involvement: Secondary | ICD-10-CM

## 2021-07-26 DIAGNOSIS — M170 Bilateral primary osteoarthritis of knee: Secondary | ICD-10-CM | POA: Diagnosis not present

## 2021-07-26 DIAGNOSIS — Z7952 Long term (current) use of systemic steroids: Secondary | ICD-10-CM

## 2021-07-26 DIAGNOSIS — M47816 Spondylosis without myelopathy or radiculopathy, lumbar region: Secondary | ICD-10-CM | POA: Diagnosis not present

## 2021-07-26 DIAGNOSIS — K51818 Other ulcerative colitis with other complication: Secondary | ICD-10-CM | POA: Diagnosis not present

## 2021-07-26 DIAGNOSIS — E559 Vitamin D deficiency, unspecified: Secondary | ICD-10-CM | POA: Diagnosis not present

## 2021-07-26 DIAGNOSIS — M8589 Other specified disorders of bone density and structure, multiple sites: Secondary | ICD-10-CM

## 2021-07-26 MED ORDER — SULFASALAZINE 500 MG PO TABS: 1000.0000 mg | ORAL_TABLET | Freq: Two times a day (BID) | ORAL | 0 refills | Status: DC

## 2021-07-26 MED ORDER — LEFLUNOMIDE 10 MG PO TABS: 10.0000 mg | ORAL_TABLET | Freq: Every day | ORAL | 0 refills | Status: DC

## 2021-07-26 NOTE — Patient Instructions (Signed)
Standing Labs ?We placed an order today for your standing lab work.  ? ?Please have your standing labs drawn in June and every 3 months  ? ?If possible, please have your labs drawn 2 weeks prior to your appointment so that the provider can discuss your results at your appointment. ? ?Please note that you may see your imaging and lab results in [place] before we have reviewed them. ?We may be awaiting multiple results to interpret others before contacting you. ?Please allow our office up to 72 hours to thoroughly review all of the results before contacting the office for clarification of your results. ? ?We have open lab daily: ?Monday through Thursday from 1:30-4:30 PM and Friday from 1:30-4:00 PM ?at the office of Dr. Bo Merino, [facility].   ?Please be advised, all patients with office appointments requiring lab work will take precedent over walk-in lab work.  ?If possible, please come for your lab work on Monday and Friday afternoons, as you may experience shorter wait times. ?The office is located at [address], [place], [place], [place] [address] ?No appointment is necessary.   ?Labs are drawn by [facility]. Please bring your co-pay at the time of your lab draw.  You may receive a bill from [place] for your lab work. ? ?Please note if you are on Hydroxychloroquine and and an order has been placed for a Hydroxychloroquine level, you will need to have it drawn 4 hours or more after your last dose. ? ?If you wish to have your labs drawn at another location, please call the office 24 hours in advance to send orders. ? ?If you have any questions regarding directions or hours of operation,  ?please call [phone].   ?As a reminder, please drink plenty of water prior to coming for your lab work. Thanks! ? ?

## 2021-07-27 LAB — COMPLETE METABOLIC PANEL WITH GFR
AG Ratio: 1.7 (calc) (ref 1.0–2.5)
ALT: 14 U/L (ref 6–29)
AST: 18 U/L (ref 10–35)
Albumin: 4.5 g/dL (ref 3.6–5.1)
Alkaline phosphatase (APISO): 55 U/L (ref 37–153)
BUN: 15 mg/dL (ref 7–25)
CO2: 27 mmol/L (ref 20–32)
Calcium: 10.1 mg/dL (ref 8.6–10.4)
Chloride: 101 mmol/L (ref 98–110)
Creat: 0.85 mg/dL (ref 0.50–1.05)
Globulin: 2.7 g/dL (calc) (ref 1.9–3.7)
Glucose, Bld: 113 mg/dL — ABNORMAL HIGH (ref 65–99)
Potassium: 4.1 mmol/L (ref 3.5–5.3)
Sodium: 138 mmol/L (ref 135–146)
Total Bilirubin: 0.7 mg/dL (ref 0.2–1.2)
Total Protein: 7.2 g/dL (ref 6.1–8.1)
eGFR: 75 mL/min/{1.73_m2} (ref >=60)

## 2021-07-27 LAB — CBC WITH DIFFERENTIAL/PLATELET
Absolute Monocytes: 743 cells/uL (ref 200–950)
Basophils Absolute: 30 cells/uL (ref 0–200)
Basophils Relative: 0.5 %
Eosinophils Absolute: 112 cells/uL (ref 15–500)
Eosinophils Relative: 1.9 %
HCT: 41.7 % (ref 35.0–45.0)
Hemoglobin: 13.4 g/dL (ref 11.7–15.5)
Lymphs Abs: 1758 cells/uL (ref 850–3900)
MCH: 30.6 pg (ref 27.0–33.0)
MCHC: 32.1 g/dL (ref 32.0–36.0)
MCV: 95.2 fL (ref 80.0–100.0)
MPV: 10.8 fL (ref 7.5–12.5)
Monocytes Relative: 12.6 %
Neutro Abs: 3257 cells/uL (ref 1500–7800)
Neutrophils Relative %: 55.2 %
Platelets: 293 10*3/uL (ref 140–400)
RBC: 4.38 10*6/uL (ref 3.80–5.10)
RDW: 11.1 % (ref 11.0–15.0)
Total Lymphocyte: 29.8 %
WBC: 5.9 10*3/uL (ref 3.8–10.8)

## 2021-07-27 LAB — VITAMIN D 25 HYDROXY (VIT D DEFICIENCY, FRACTURES): Vit D, 25-Hydroxy: 37 ng/mL (ref 30–100)

## 2021-07-27 NOTE — Progress Notes (Signed)
Glucose is 113.  Rest of CMP WNL.  CBC WNL.  Vitamin D WNL.

## 2021-07-27 NOTE — Telephone Encounter (Signed)
Got onto [facility] site, results printed. INVITAE # [id number]  (TEST:  hATTTR amyloidosis) test.  NEGATIVE RESULT.  Report in inbox.  ?

## 2021-08-01 ENCOUNTER — Other Ambulatory Visit: Payer: Self-pay | Admitting: Internal Medicine

## 2021-08-11 NOTE — Telephone Encounter (Signed)
Pt did not read mychart message.  Sent result in mail for her.   ?

## 2021-09-15 DIAGNOSIS — H52213 Irregular astigmatism, bilateral: Secondary | ICD-10-CM | POA: Diagnosis not present

## 2021-09-15 DIAGNOSIS — H2512 Age-related nuclear cataract, left eye: Secondary | ICD-10-CM | POA: Diagnosis not present

## 2021-09-15 DIAGNOSIS — H25042 Posterior subcapsular polar age-related cataract, left eye: Secondary | ICD-10-CM | POA: Diagnosis not present

## 2021-09-15 DIAGNOSIS — H35033 Hypertensive retinopathy, bilateral: Secondary | ICD-10-CM | POA: Diagnosis not present

## 2021-09-15 DIAGNOSIS — H25012 Cortical age-related cataract, left eye: Secondary | ICD-10-CM | POA: Diagnosis not present

## 2021-11-23 ENCOUNTER — Other Ambulatory Visit: Payer: Self-pay | Admitting: Physician Assistant

## 2021-11-23 DIAGNOSIS — Z79899 Other long term (current) drug therapy: Secondary | ICD-10-CM

## 2021-11-23 NOTE — Telephone Encounter (Signed)
Next Visit: 01/11/2022  Last Visit: 07/26/2021  Last Fill: 07/26/2021  DX: Rheumatoid arthritis involving multiple sites with positive rheumatoid factor   Current Dose per office note 07/26/2021: Arava 10 mg 1 tablet by mouth daily   Labs: 07/26/2021 Glucose is 113.  Rest of CMP WNL.  CBC WNL.  Vitamin D WNL.  Patient advised she is due to update her labs. Patient states she will be in this week to update.   Okay to refill Arava?

## 2021-11-28 ENCOUNTER — Other Ambulatory Visit: Payer: Self-pay | Admitting: *Deleted

## 2021-11-28 DIAGNOSIS — Z79899 Other long term (current) drug therapy: Secondary | ICD-10-CM

## 2021-11-29 LAB — COMPLETE METABOLIC PANEL WITH GFR
AG Ratio: 1.8 (calc) (ref 1.0–2.5)
ALT: 15 U/L (ref 6–29)
AST: 20 U/L (ref 10–35)
Albumin: 4.6 g/dL (ref 3.6–5.1)
Alkaline phosphatase (APISO): 56 U/L (ref 37–153)
BUN: 15 mg/dL (ref 7–25)
CO2: 25 mmol/L (ref 20–32)
Calcium: 10 mg/dL (ref 8.6–10.4)
Chloride: 104 mmol/L (ref 98–110)
Creat: 0.99 mg/dL (ref 0.50–1.05)
Globulin: 2.5 g/dL (calc) (ref 1.9–3.7)
Glucose, Bld: 101 mg/dL (ref 65–139)
Potassium: 4.2 mmol/L (ref 3.5–5.3)
Sodium: 139 mmol/L (ref 135–146)
Total Bilirubin: 0.7 mg/dL (ref 0.2–1.2)
Total Protein: 7.1 g/dL (ref 6.1–8.1)
eGFR: 62 mL/min/{1.73_m2} (ref >=60)

## 2021-11-29 LAB — CBC WITH DIFFERENTIAL/PLATELET
Absolute Monocytes: 713 cells/uL (ref 200–950)
Basophils Absolute: 18 cells/uL (ref 0–200)
Basophils Relative: 0.4 %
Eosinophils Absolute: 110 cells/uL (ref 15–500)
Eosinophils Relative: 2.4 %
HCT: 40.1 % (ref 35.0–45.0)
Hemoglobin: 13.3 g/dL (ref 11.7–15.5)
Lymphs Abs: 1357 cells/uL (ref 850–3900)
MCH: 31.7 pg (ref 27.0–33.0)
MCHC: 33.2 g/dL (ref 32.0–36.0)
MCV: 95.5 fL (ref 80.0–100.0)
MPV: 11.6 fL (ref 7.5–12.5)
Monocytes Relative: 15.5 %
Neutro Abs: 2401 cells/uL (ref 1500–7800)
Neutrophils Relative %: 52.2 %
Platelets: 255 10*3/uL (ref 140–400)
RBC: 4.2 10*6/uL (ref 3.80–5.10)
RDW: 11.5 % (ref 11.0–15.0)
Total Lymphocyte: 29.5 %
WBC: 4.6 10*3/uL (ref 3.8–10.8)

## 2021-11-29 NOTE — Progress Notes (Signed)
CBC and CMP WNL

## 2021-12-01 ENCOUNTER — Encounter: Payer: Self-pay | Admitting: Neurology

## 2021-12-01 ENCOUNTER — Ambulatory Visit: Payer: PPO | Admitting: Neurology

## 2021-12-01 VITALS — BP 139/91 | HR 86 | Ht 68.0 in | Wt 136.0 lb

## 2021-12-01 DIAGNOSIS — G629 Polyneuropathy, unspecified: Secondary | ICD-10-CM | POA: Diagnosis not present

## 2021-12-01 NOTE — Progress Notes (Signed)
[facility]    Provider:  Dr Jaynee Eagles Requesting Provider: Dr. Ellene Route  Primary Care Provider:  Maurice Small, MD  CC:  small fiber neuropathy bottom of feet  12/01/2021: Follow up small fiber neuropathy bilaterally: Genetic testing negative. Blood work largely unremarkable, risk factor includes hx of autoimmune disorder, we discussed multiple options see assessment and plan.  Patient complains of symptoms per HPI as well as the following symptoms: numbness bilat feet . Pertinent negatives and positives per HPI. All others negative  Interval history 06/20/2021: She has numbness on the bottom of the feet, started on the left foot and now to the right foot, in the toes and the ball of the foot. Doesn't feel it with shoes on, only in at bed at night, bothers about worrying here it Is going, not painful but strange, like falling asleep. She does not have pain when walking or in the morning. Feels like a circulation problem, feet feel cold but Dr. Justin Mend feels like its circulation. No problems with back, no radicular symptoms. Father had amyloid protein. Father died of amyloidosis of the heart.   Pat testng hgba1c 4.8, b12/folate/b1 and b6 within normal limits, IFE nml  Patient complains of symptoms per HPI as well as the following symptoms: numbness and tinging in feet . Pertinent negatives and positives per HPI. All others negative   HPI:  Terri Hudson is a 71 y.o. female here as requested by Maurice Small, MD for left lateral foot numbness. PMHx chronic kidney disease, osteoporosis, neuropathy, chronic fatigue, ulcerative colitis, migraine aura without headache, polymyalgia rheumatica, leukopenia.    Patient is here alone. States left foot numbness ongoing for about a year. More tingling. Rather than numbness across the ball of her foot. Left foot. The bottom of the foot. He has a hx of PMR and ulcerative colitis. She has been on many medications for other autoimmune diseases,  been on steroids and others. Worsened over the last year but very slowly. No inciting events or new medications. No pain anywhere, feels weird like she has a piece of paper stuck on her foot. No back pain, no radicular symptoms, no ankle or knee pain or injuries. She had knee injury a long time ago, no time course relation. No weakness in the foot. No chemotherapy. No diabetes.    Review of Systems: Patient complains of symptoms per HPI as well as the following symptoms: tingling. Pertinent negatives and positives per HPI. All others negative.   Social History   Socioeconomic History   Marital status: Married    Spouse name: Not on file   Number of children: 2   Years of education: Not on file   Highest education level: Bachelor's degree (e.g., BA, AB, BS)  Occupational History   Not on file  Tobacco Use   Smoking status: Never    Passive exposure: Never   Smokeless tobacco: Never  Vaping Use   Vaping Use: Never used  Substance and Sexual Activity   Alcohol use: Yes    Alcohol/week: 8.0 standard drinks of alcohol    Types: 7 Glasses of wine, 1 Standard drinks or equivalent per week    Comment: 1 glass/day   Drug use: Never   Sexual activity: Not on file  Other Topics Concern   Not on file  Social History Narrative   Lives at home with husband   Right handed   Caffeine: 2 cups/day   Social Determinants of Health   Financial Resource Strain: Not on  file  Food Insecurity: Not on file  Transportation Needs: Not on file  Physical Activity: Not on file  Stress: Not on file  Social Connections: Not on file  Intimate Partner Violence: Not on file    Family History  Problem Relation Age of Onset   Heart disease Mother    Osteoporosis Father    Barrett's esophagus Father    Cancer Father    Heart disease Father    Migraines Daughter    Bowel Disease Son    Colon cancer Neg Hx    Colon polyps Neg Hx    Esophageal cancer Neg Hx    Stomach cancer Neg Hx    Rectal cancer  Neg Hx    Neuropathy Neg Hx     Past Medical History:  Diagnosis Date   Leukopenia    Migraine    Osteoarthritis    Osteoporosis 02/12/2018   Polymyalgia rheumatica ([place])    Ulcerative colitis ([place])    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[place])     Patient Active Problem List   Diagnosis Date Noted   Small fiber neuropathy 06/20/2021   Family history of amyloidosis 06/20/2021   Numbness and tingling of foot 07/28/2020   Rheumatoid arthritis involving multiple sites with positive rheumatoid factor ([place]) 06/15/2020   High risk medication use 11/20/2017   On prednisone therapy 11/20/2017   Primary osteoarthritis of both knees 11/20/2017   Spondylosis of lumbar spine 11/20/2017   Ulcerative colitis ([place]) 11/20/2017   Abnormal laboratory test 11/20/2017   Polymyalgia rheumatica ([place]) 06/07/2017    Past Surgical History:  Procedure Laterality Date   COLONOSCOPY     HAND SURGERY Left    2015 pt reports cutting her hand accidentally and needing exploratory surgery to check for nerve damage   KNEE SURGERY Right     Current Outpatient Medications  Medication Sig Dispense Refill   cholecalciferol (VITAMIN D3) 25 MCG (1000 UT) tablet Take 1,000 Units by mouth daily.     leflunomide (ARAVA) 10 MG tablet TAKE 1 TABLET BY MOUTH EVERY DAY 30 tablet 0   naratriptan (AMERGE) 2.5 MG tablet Take 2.5 mg by mouth as needed for migraine. Take one (1) tablet at onset of headache; if returns or does not resolve, may repeat after 4 hours; do not exceed five (5) mg in 24 hours.     sulfaSALAzine (AZULFIDINE) 500 MG tablet Take 2 tablets (1,000 mg total) by mouth 2 (two) times daily. 360 tablet 0   No current facility-administered medications for this visit.    Allergies as of 12/01/2021   (No Known Allergies)    Vitals: BP (!) 139/91   Pulse 86   Ht 5' 8"  (1.727 m)   Wt 136 lb (61.7 kg)   BMI 20.68 kg/m  Last Weight:  Wt Readings from Last 1 Encounters:  12/01/21 136 lb (61.7 kg)   Last  Height:   Ht Readings from Last 1 Encounters:  12/01/21 5' 8"  (1.727 m)     Physical exam: stable Exam: Gen: NAD, conversant, well nourised, well groomed                     CV: RRR, no MRG. No Carotid Bruits. No peripheral edema, warm, nontender Eyes: Conjunctivae clear without exudates or hemorrhage  Neuro: stable Detailed Neurologic Exam  Speech:    Speech is normal; fluent and spontaneous with normal comprehension.  Cognition:    The patient is oriented to person, place, and time;  recent and remote memory intact;     language fluent;     normal attention, concentration,     fund of knowledge Cranial Nerves:    The pupils are equal, round, and reactive to light.pupils too small to visualize fundi. . Visual fields are full to finger confrontation. Extraocular movements are intact. Trigeminal sensation is intact and the muscles of mastication are normal. The face is symmetric. The palate elevates in the midline. Hearing intact. Voice is normal. Shoulder shrug is normal. The tongue has normal motion without fasciculations.   Coordination:    No dysmetria or ataxia   Gait:    Normal native gait  Motor Observation:    No asymmetry, no atrophy, and no involuntary movements noted. Tone:    Normal muscle tone.    Posture:    Posture is normal. normal erect    Strength:    Strength is V/V in the upper and lower limbs.      Sensation: intact to LT, pin prick, vibration distally     Reflex Exam:  DTR's:    Absent AJs   Toes:    The toes are equivocal bilaterally.   Clonus:    Clonus is absent.    Assessment/Plan:  [age] year old with tingling on the bottom only of the foot from the arch to the bottom of the toes (just the bottom ball and arch of foot and only on the left) and now on the right foot. Mild, sensory exam is normal, testing included hgba1c 4.8, b12/folate/b1 and b6 within normal limits, IFE nml Another possibility is L5/s1 nerve impingement but she  denies any low back pain or radicular symptoms. She does have autoimmune disorders and these are know to cause small-fiber polyneuropathy. I don't think her symptoms are severe enough where we would get any additional information from emg/ncs which does not detect small-fiber neuropathy. Father had cardiac amyloidosis, we checked her for familial amyloidosis with genetic testing which was negative, long discussion about options:  -Options: Botox - consider - OT: TENS - next door, referred her there and walked her over for appointment -Podiatry: infrared laser, other therapy: [facility]. For laser treatment in neuropathy - call them. Will refer to [facility] -Compounded topical cream: [employer] - consider, she will let us know -Lidocaine cream, capsaicin cream over the counter - try first before compounding cream -May consider daily alpha lipoic acid(Over the counter) which is an antioxidant that may reduce free radical oxidative stress associated with diabetic polyneuropathy, existing evidence suggests that alpha lipoic acid significantly reduces stabbing, lancinating and burning pain and diabetic neuropathy with its onset of action as early as 1-2 weeks.400-668m daily and then try 2x a day.  - [facility] The[facility]ies - she can call and ask Peripheral neuropathy feet eval and treat - [email or web address] [place]- call and ask if dry needling helps with neuropathy in the feet - Talk to Rheumatology - may be related to autoimmune disorder - Next can order MRI lumbar spine AND can refer to an academic center if she requests  Orders Placed This Encounter  Procedures   Ambulatory referral to Occupational Therapy   Ambulatory referral to Podiatry     Cc:  WMaurice Small MD  ASarina Ill MD  [facility] [address] [address] Phone [phone] [phone] I spent over 40 minutes of  face-to-face and non-face-to-face time with patient on the  1. Small fiber neuropathy     diagnosis.  This included previsit chart review, lab review, study review, order entry, electronic health record documentation, patient education on the different diagnostic and therapeutic options, counseling and coordination of care, risks and benefits of management, compliance, or risk factor reduction

## 2021-12-01 NOTE — Patient Instructions (Addendum)
-  Options: Botox - consider - PT: TENS - next door -Podiatry: infrared laser, other therapy: [facility]. For laser treatment in neuropathy - call them. Will refer to [facility] -Compounded topical cream: [employer] - consider -Lidocaine cream, capsaicin cream over the counter - try first before compounding cream -May consider daily alpha lipoic acid(Over the counter) which is an antioxidant that may reduce free radical oxidative stress associated with diabetic polyneuropathy, existing evidence suggests that alpha lipoic acid significantly reduces stabbing, lancinating and burning pain and diabetic neuropathy with its onset of action as early as 1-2 weeks.400-623m daily and then try 2x a day.  - [facility] Therapies - can call and ask Peripheral neuropathy feet eval and treat - [email or web address] [place]- call and ask if dry needling helps with neuropathy in the feet - Talk to Rheumatology - may be related to autoimmune disorder - Next MRI lumbar spine AND can refer to an academic center  Orders Placed This Encounter  Procedures   Ambulatory referral to Occupational Therapy   Ambulatory referral to Podiatry

## 2021-12-08 ENCOUNTER — Ambulatory Visit: Payer: PPO | Attending: Family Medicine | Admitting: Physical Therapy

## 2021-12-08 ENCOUNTER — Encounter: Payer: Self-pay | Admitting: Physical Therapy

## 2021-12-08 DIAGNOSIS — R2689 Other abnormalities of gait and mobility: Secondary | ICD-10-CM | POA: Insufficient documentation

## 2021-12-08 DIAGNOSIS — G629 Polyneuropathy, unspecified: Secondary | ICD-10-CM | POA: Diagnosis not present

## 2021-12-08 NOTE — Addendum Note (Signed)
Addended by: Sarina Ill B on: 12/08/2021 10:37 AM   Modules accepted: Orders

## 2021-12-08 NOTE — Therapy (Signed)
OUTPATIENT PHYSICAL THERAPY NEURO EVALUATION   Patient Name: Terri Hudson MRN: [id number] DOB:[birth year], 71 y.o., female Today's Date: 12/08/2021   PCP: Glenis Smoker, MD REFERRING PROVIDER: Melvenia Beam, MD    PT End of Session - 12/08/21 1404     Visit Number 1    Number of Visits 5   with eval   Date for PT Re-Evaluation 01/05/22    Authorization Type [employer]    PT Start Time 3817    PT Stop Time 1450    PT Time Calculation (min) 47 min    Activity Tolerance Patient tolerated treatment well;No increased pain    Behavior During Therapy [facility] for tasks assessed/performed             Past Medical History:  Diagnosis Date   Leukopenia    Migraine    Osteoarthritis    Osteoporosis 02/12/2018   Polymyalgia rheumatica ([place])    Ulcerative colitis ([place])    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[place])    Past Surgical History:  Procedure Laterality Date   COLONOSCOPY     HAND SURGERY Left    2015 pt reports cutting her hand accidentally and needing exploratory surgery to check for nerve damage   KNEE SURGERY Right    Patient Active Problem List   Diagnosis Date Noted   Small fiber neuropathy 06/20/2021   Family history of amyloidosis 06/20/2021   Numbness and tingling of foot 07/28/2020   Rheumatoid arthritis involving multiple sites with positive rheumatoid factor ([place]) 06/15/2020   High risk medication use 11/20/2017   On prednisone therapy 11/20/2017   Primary osteoarthritis of both knees 11/20/2017   Spondylosis of lumbar spine 11/20/2017   Ulcerative colitis ([place]) 11/20/2017   Abnormal laboratory test 11/20/2017   Polymyalgia rheumatica ([place]) 06/07/2017    ONSET DATE: 12/01/2021   REFERRING DIAG: G62.9 (ICD-10-CM) - Small fiber neuropathy   THERAPY DIAG:  Other abnormalities of gait and mobility  Rationale for Evaluation and Treatment Rehabilitation  SUBJECTIVE:                                                                                                                                                                                               SUBJECTIVE STATEMENT: Pt reports onset of peripheral neuropathy that has worsened over the past year and is starting to travel up her legs. Pt describes the sensation as tingling in both feet, feels like she is wearing socks and stepping on pebbles and glass. Has worsened over the past year and is traveling up the legs.  Pt accompanied by: self  PERTINENT HISTORY: PMHx chronic kidney  disease, osteoporosis, neuropathy, chronic fatigue, ulcerative colitis, migraine aura without headache, polymyalgia rheumatica, leukopenia.   PAIN:  Are you having pain? No  No pain currently, does report intermittent sharp pain in LE and constant tingling.  Pt reports exercising frequently (3x/week doing cardio, weights, stretching, balance) and that has helped with her pain as well as wearing Birkenstocks.   PRECAUTIONS: None  WEIGHT BEARING RESTRICTIONS No  FALLS: Has patient fallen in last 6 months? No  LIVING ENVIRONMENT: Lives with: lives with their spouse Lives in: House/apartment Stairs: Yes: Internal: 12 steps; can reach both and External: 4 steps; none Has following equipment at home: None  PLOF: Independent with gait and Independent with transfers  PATIENT GOALS "I want to reverse this peripheral neuropathy"  OBJECTIVE:   COGNITION: Overall cognitive status: Within functional limits for tasks assessed   SENSATION: Peripheral neuropathy BLE, ongoing tingling sensation  POSTURE: rounded shoulders and forward head  LOWER EXTREMITY ROM:     Passive  Right Eval Left Eval  Hip flexion    Hip extension    Hip abduction    Hip adduction    Hip internal rotation    Hip external rotation    Knee flexion    Knee extension    Ankle dorsiflexion 5 degrees 10 degrees  Ankle plantarflexion    Ankle inversion    Ankle eversion     (Blank rows = not  tested)  LOWER EXTREMITY MMT:    MMT Right Eval Left Eval  Hip flexion 5 5  Hip extension    Hip abduction    Hip adduction    Hip internal rotation    Hip external rotation    Knee flexion 5 5  Knee extension 5 5  Ankle dorsiflexion 4 4  Ankle plantarflexion    Ankle inversion    Ankle eversion    (Blank rows = not tested)  BED MOBILITY:  Independent  TRANSFERS: Assistive device utilized: None  Sit to stand: Complete Independence Stand to sit: Complete Independence Chair to chair: Complete Independence Floor:  not assessed this session  STAIRS:  Level of Assistance: Complete Independence  Stair Negotiation Technique: Alternating Pattern  with No Rails  Number of Stairs: 4   Height of Stairs: 6"  Comments: WFL  GAIT: Gait pattern: WFL Distance walked: various clinic distances Assistive device utilized: None Level of assistance: Complete Independence Comments: WFL  FUNCTIONAL TESTs:     [facility] PT Assessment - 12/08/21 1419       Ambulation/Gait   Gait velocity 32.8' over 7.53 sec = 4.36 ft/sec      Standardized Balance Assessment   Standardized Balance Assessment Berg Balance Test      Berg Balance Test   Sit to Stand Able to stand without using hands and stabilize independently    Standing Unsupported Able to stand safely 2 minutes    Sitting with Back Unsupported but Feet Supported on Floor or Stool Able to sit safely and securely 2 minutes    Stand to Sit Sits safely with minimal use of hands    Transfers Able to transfer safely, minor use of hands    Standing Unsupported with Eyes Closed Able to stand 10 seconds safely    Standing Unsupported with Feet Together Able to place feet together independently and stand 1 minute safely    From Standing, Reach Forward with Outstretched Arm Can reach forward >12 cm safely (5")    From Standing Position, Pick up Object from [place]  to pick up shoe safely and easily    From Standing Position, Turn to Look  Behind Over each Shoulder Looks behind from both sides and weight shifts well    Turn 360 Degrees Able to turn 360 degrees safely in 4 seconds or less    Standing Unsupported, Alternately Place Feet on Step/Stool Able to stand independently and safely and complete 8 steps in 20 seconds    Standing Unsupported, One Foot in [place] to place foot tandem independently and hold 30 seconds    Standing on One Leg Able to lift leg independently and hold > 10 seconds    Total Score 55      Functional Gait  Assessment   Gait assessed  Yes    Gait Level Surface Walks 20 ft in less than 5.5 sec, no assistive devices, good speed, no evidence for imbalance, normal gait pattern, deviates no more than 6 in outside of the 12 in walkway width.    Change in Gait Speed Able to smoothly change walking speed without loss of balance or gait deviation. Deviate no more than 6 in outside of the 12 in walkway width.    Gait with Horizontal Head Turns Performs head turns smoothly with no change in gait. Deviates no more than 6 in outside 12 in walkway width    Gait with Vertical Head Turns Performs head turns with no change in gait. Deviates no more than 6 in outside 12 in walkway width.    Gait and Pivot Turn Pivot turns safely within 3 sec and stops quickly with no loss of balance.    Step Over Obstacle Is able to step over 2 stacked shoe boxes taped together (9 in total height) without changing gait speed. No evidence of imbalance.    Gait with Narrow Base of Support Ambulates 7-9 steps.    Gait with Eyes Closed Walks 20 ft, slow speed, abnormal gait pattern, evidence for imbalance, deviates 10-15 in outside 12 in walkway width. Requires more than 9 sec to ambulate 20 ft.    Ambulating Backwards Walks 20 ft, uses assistive device, slower speed, mild gait deviations, deviates 6-10 in outside 12 in walkway width.    Steps Alternating feet, no rail.    Total Score 26             PATIENT SURVEYS:  FAAM (Foot and  Ankle Ability Measure) : 79/84 or 94% level of function  TODAY'S TREATMENT:  PT Evaluation  TherEx: See HEP below  PATIENT EDUCATION: Education details: Eval findings, POC, HEP, TENS and dry needling Person educated: Patient Education method: Explanation, Demonstration, and Handouts Education comprehension: verbalized understanding   HOME EXERCISE PROGRAM: Access Code: [email or web address] URL: [email or web address] Date: 12/08/2021 Prepared by: Excell Seltzer  Exercises - Long Sitting Calf Stretch with Strap  - 1 x daily - 7 x weekly - 1 sets - 5 reps - 60 hold - Gastroc Stretch on Wall  - 1 x daily - 7 x weekly - 1 sets - 5 reps - 60 hold - Seated Anterior Tibialis Stretch  - 1 x daily - 7 x weekly - 1 sets - 5 reps - 60 hold  GOALS: Goals reviewed with patient? Yes  STG = LTG due to POC length  LONG TERM GOALS: Target date: 01/05/2022  Pt will be independent with ankle ROM HEP for improved balance, transfers and gait. Baseline: Goal status: INITIAL  2.  Pt will improve FGA to 30/30 for decreased fall risk and increased  safety with functional mobility Baseline: 26/30 on 12/08/21 Goal status: INITIAL   ASSESSMENT:  CLINICAL IMPRESSION: Patient is a 71 year old female referred to Neuro OPPT for peripheral neuropathy in BLE.   Pt's PMH is significant for: chronic kidney disease, osteoporosis, neuropathy, chronic fatigue, ulcerative colitis, migraine aura without headache, polymyalgia rheumatica, leukopenia.  The following deficits were present during the exam: decreased B ankle DF PROM and tingling/sharp pain in feet and ankles due to peripheral neuropathy. Educated pt on possible use of TENS and/or dry needling for management of symptoms in future PT sessions if pt agreeable but that options for treating and/or reversing peripheral neuropathy are limited from a PT perspective. Provided pt with ankle stretching program to increase PROM and improve ability to perform  functional mobility. Pt exhibits low fall risk overall based on scores of 55/56 on BBS and 26/30 on FGA. She is also able to perform static standing on airex with no UE support with Supervision in Romberg stance both with EO and EC x 30 sec each. Pt would benefit from skilled PT to address these impairments and functional limitations to maximize functional mobility independence.    OBJECTIVE IMPAIRMENTS decreased knowledge of condition, impaired perceived functional ability, impaired flexibility, impaired sensation, and pain.   ACTIVITY LIMITATIONS squatting  PARTICIPATION LIMITATIONS: community activity  PERSONAL FACTORS  chronic kidney disease, osteoporosis, neuropathy, chronic fatigue, ulcerative colitis, migraine aura without headache, polymyalgia rheumatica, leukopenia.  are also affecting patient's functional outcome.   REHAB POTENTIAL: Fair diagnosis of idiopathic peripheral neuropathy, unlikely that PT will achieve pt's goal of reversal of neuropathy  CLINICAL DECISION MAKING: Stable/uncomplicated  EVALUATION COMPLEXITY: Low  PLAN: PT FREQUENCY: 1x/week  PT DURATION: 4 weeks  PLANNED INTERVENTIONS: Therapeutic exercises, Therapeutic activity, Neuromuscular re-education, Balance training, Gait training, Patient/Family education, Self Care, Joint mobilization, Stair training, DME instructions, Dry Needling, Electrical stimulation, Cryotherapy, Moist heat, Taping, Manual therapy, and Re-evaluation  PLAN FOR NEXT SESSION: Reassess ankle PROM, TENS, dry needling   Excell Seltzer, PT, DPT, CSRS 12/08/2021, 3:00 PM

## 2021-12-17 ENCOUNTER — Other Ambulatory Visit: Payer: Self-pay | Admitting: Physician Assistant

## 2021-12-19 NOTE — Telephone Encounter (Signed)
Next Visit: 01/11/2022   Last Visit: 07/26/2021   Last Fill: 11/23/2021 (30 day supply)  DX: Rheumatoid arthritis involving multiple sites with positive rheumatoid factor    Current Dose per office note 07/26/2021: Arava 10 mg 1 tablet by mouth daily    Labs: 11/28/2021 CBC and CMP WNL  Okay to refill Arava?

## 2021-12-28 ENCOUNTER — Ambulatory Visit: Payer: PPO | Admitting: Rheumatology

## 2021-12-28 NOTE — Progress Notes (Signed)
Office Visit Note  Patient: Terri Hudson             Date of Birth: 17-Jan-1951           MRN: [id number]             PCP: Glenis Smoker, MD Referring: Maurice Small, MD Visit Date: 01/11/2022 Occupation: @GUAROCC @  Subjective:  Medication management  History of Present Illness: Terri Hudson is a 71 y.o. female with history of seropositive rheumatoid arthritis, Polly myalgia rheumatica and osteoporosis.  She states for the last 1 year she is having progressive neuropathy in her feet.  She states started in her toes and gradually moved to her ankles.  She has been seeing Dr. Jaynee Eagles who suspects that she may have a small fiber neuropathy.  Patient states she did not have a skin biopsy.  She has been taking leflunomide 10 mg p.o. daily and sulfasalazine 500 mg 2 tablets p.o. twice daily without any side effects.  She has been off Fosamax since February 2022.  She had a DEXA scan on July 21, 2020.  Activities of Daily Living:  Patient reports morning stiffness for 0 minutes.   Patient Denies nocturnal pain.  Difficulty dressing/grooming: Denies Difficulty climbing stairs: Denies Difficulty getting out of chair: Denies Difficulty using hands for taps, buttons, cutlery, and/or writing: Denies  Review of Systems  Constitutional:  Negative for fatigue.  HENT:  Negative for mouth sores and mouth dryness.   Eyes:  Negative for dryness.  Respiratory:  Negative for shortness of breath.   Cardiovascular:  Negative for chest pain and palpitations.  Gastrointestinal:  Negative for blood in stool, constipation and diarrhea.  Endocrine: Negative for increased urination.  Genitourinary:  Negative for involuntary urination.  Musculoskeletal:  Negative for joint pain, gait problem, joint pain, joint swelling, myalgias, muscle weakness, morning stiffness, muscle tenderness and myalgias.  Skin:  Negative for color change, rash, hair loss and sensitivity to sunlight.   Allergic/Immunologic: Negative for susceptible to infections.  Neurological:  Positive for numbness. Negative for dizziness and headaches.  Hematological:  Negative for swollen glands.  Psychiatric/Behavioral:  Negative for depressed mood and sleep disturbance. The patient is not nervous/anxious.     PMFS History:  Patient Active Problem List   Diagnosis Date Noted   Small fiber neuropathy 06/20/2021   Family history of amyloidosis 06/20/2021   Numbness and tingling of foot 07/28/2020   Rheumatoid arthritis involving multiple sites with positive rheumatoid factor ([place]) 06/15/2020   High risk medication use 11/20/2017   On prednisone therapy 11/20/2017   Primary osteoarthritis of both knees 11/20/2017   Spondylosis of lumbar spine 11/20/2017   Ulcerative colitis ([place]) 11/20/2017   Abnormal laboratory test 11/20/2017   Polymyalgia rheumatica ([place]) 06/07/2017    Past Medical History:  Diagnosis Date   Leukopenia    Migraine    Osteoarthritis    Osteoporosis 02/12/2018   Polymyalgia rheumatica ([place])    Ulcerative colitis ([place])    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[place])     Family History  Problem Relation Age of Onset   Heart disease Mother    Osteoporosis Father    Barrett's esophagus Father    Cancer Father    Heart disease Father    Migraines Daughter    Bowel Disease Son    Colon cancer Neg Hx    Colon polyps Neg Hx    Esophageal cancer Neg Hx    Stomach cancer Neg Hx    Rectal cancer  Neg Hx    Neuropathy Neg Hx    Past Surgical History:  Procedure Laterality Date   COLONOSCOPY     HAND SURGERY Left    2015 pt reports cutting her hand accidentally and needing exploratory surgery to check for nerve damage   KNEE SURGERY Right    Social History   Social History Narrative   Lives at home with husband   Right handed   Caffeine: 2 cups/day   Immunization History  Administered Date(s) Administered   Fluad Quad(high Dose 65+) 01/14/2019   Influenza, High Dose  Seasonal PF 03/06/2018, 01/28/2020, 03/04/2021   PFIZER Comirnaty(Gray Top)Covid-19 Tri-Sucrose Vaccine 09/30/2020   PFIZER(Purple Top)SARS-COV-2 Vaccination 06/04/2019, 06/25/2019, 02/05/2020   Pfizer Covid-19 Vaccine Bivalent Booster 15yr & up 02/11/2021     Objective: Vital Signs: BP 131/82 (BP Location: Left Arm, Patient Position: Sitting, Cuff Size: Normal)   Pulse 80   Resp 16   Ht 5' 8"  (1.727 m)   Wt 134 lb 6.4 oz (61 kg)   BMI 20.44 kg/m    Physical Exam Vitals and nursing note reviewed.  Constitutional:      Appearance: She is well-developed.  HENT:     Head: Normocephalic and atraumatic.  Eyes:     Conjunctiva/sclera: Conjunctivae normal.  Cardiovascular:     Rate and Rhythm: Normal rate and regular rhythm.     Heart sounds: Normal heart sounds.  Pulmonary:     Effort: Pulmonary effort is normal.     Breath sounds: Normal breath sounds.  Abdominal:     General: Bowel sounds are normal.     Palpations: Abdomen is soft.  Musculoskeletal:     Cervical back: Normal range of motion.  Lymphadenopathy:     Cervical: No cervical adenopathy.  Skin:    General: Skin is warm and dry.     Capillary Refill: Capillary refill takes less than 2 seconds.  Neurological:     Mental Status: She is alert and oriented to person, place, and time.  Psychiatric:        Behavior: Behavior normal.      Musculoskeletal Exam: C-spine was in good range of motion.  She had thoracic kyphosis.  She has some discomfort range of motion of the lumbar spine.  Shoulder joints, elbow joints, wrist joints, MCPs PIPs and DIPs with good range of motion with no synovitis.  Hip joints, and knee joints with good range of motion with no synovitis.  She had no tenderness over ankles or MTPs.  No synovitis was noted.  CDAI Exam: CDAI Score: 0  Patient Global: 0 mm; Provider Global: 0 mm Swollen: 0 ; Tender: 0  Joint Exam 01/11/2022   No joint exam has been documented for this visit   There is  currently no information documented on the homunculus. Go to the Rheumatology activity and complete the homunculus joint exam.  Investigation: No additional findings.  Imaging: No results found.  Recent Labs: Lab Results  Component Value Date   WBC 4.6 11/28/2021   HGB 13.3 11/28/2021   PLT 255 11/28/2021   NA 139 11/28/2021   K 4.2 11/28/2021   CL 104 11/28/2021   CO2 25 11/28/2021   GLUCOSE 101 11/28/2021   BUN 15 11/28/2021   CREATININE 0.99 11/28/2021   BILITOT 0.7 11/28/2021   ALKPHOS 84 08/26/2019   AST 20 11/28/2021   ALT 15 11/28/2021   PROT 7.1 11/28/2021   ALBUMIN 4.6 08/26/2019   CALCIUM 10.0 11/28/2021   GFRAA  79 09/29/2020   QFTBGOLDPLUS NEGATIVE 08/26/2019    Speciality Comments: No specialty comments available.  Procedures:  No procedures performed Allergies: Patient has no known allergies.   Assessment / Plan:     Visit Diagnoses: Rheumatoid arthritis involving multiple sites with positive rheumatoid factor , +CCP Ab: Her rheumatoid arthritis is very well controlled on leflunomide and sulfasalazine combination.  She had no synovitis on examination.  She has been tolerating medications well.  High risk medication use - Leflunomide 10 mg p.o. daily and sulfasalazine 500 mg 2 tablets p.o. twice daily. Labs obtained on November 28, 2021 CBC with differential and CMP with GFR within normal limits.  She was advised to get labs in October and every 3 months to monitor for drug toxicity.  Information on immunization was placed in the AVS.  She was also advised to stop leflunomide and sulfasalazine if she develops an infection and resume after the infection resolves.  Polymyalgia rheumatica ([place]) - Off prednisone since March 2022.  She has no increased muscular weakness or tenderness on examination.  Other ulcerative colitis with other complication ([place]) - Followed by Dr. Hilarie Fredrickson.  She had been asymptomatic on the current regimen.  Primary osteoarthritis of both  knees-she denies any discomfort today.  Spondylosis of lumbar spine-she can use to have some discomfort in her lumbar spine.  Restrengthening exercises were discussed.  Small fiber neuropathy-patient states that she has been experiencing neuropathy in her bilateral lower extremities over the last 1 year.  She states the neuropathy is progressive.  She was diagnosed with small fiber neuropathy by Dr. Jaynee Eagles.  Patient does not recall having skin biopsy.  She is considering a second opinion.  She will discuss this further with Dr. Jaynee Eagles.  Age-related osteoporosis without current pathological fracture - July 21, 2020: DXA, BMD  RFN  T score -2.3, BMD 0.591 lumbar.  T-scores improved.  BMD has been stable.  DEXA scan results were discussed with the patient.  Treated with Fosamax for 1 year which was discontinued in February 22.  We will consider restarting on Fosamax after repeat BMD.  Vitamin D deficiency-vitamin D was normal on July 26, 2021.  Orders: No orders of the defined types were placed in this encounter.  No orders of the defined types were placed in this encounter.  .  Follow-Up Instructions: Return in about 5 months (around 06/13/2022) for Rheumatoid arthritis, Polymyalgia rheumatica.   Bo Merino, MD  Note - This record has been created using Editor, commissioning.  Chart creation errors have been sought, but may not always  have been located. Such creation errors do not reflect on  the standard of medical care.

## 2022-01-02 DIAGNOSIS — H35033 Hypertensive retinopathy, bilateral: Secondary | ICD-10-CM | POA: Diagnosis not present

## 2022-01-02 DIAGNOSIS — H25012 Cortical age-related cataract, left eye: Secondary | ICD-10-CM | POA: Diagnosis not present

## 2022-01-02 DIAGNOSIS — H25042 Posterior subcapsular polar age-related cataract, left eye: Secondary | ICD-10-CM | POA: Diagnosis not present

## 2022-01-02 DIAGNOSIS — H524 Presbyopia: Secondary | ICD-10-CM | POA: Diagnosis not present

## 2022-01-02 DIAGNOSIS — H2512 Age-related nuclear cataract, left eye: Secondary | ICD-10-CM | POA: Diagnosis not present

## 2022-01-11 ENCOUNTER — Encounter: Payer: Self-pay | Admitting: Rheumatology

## 2022-01-11 ENCOUNTER — Ambulatory Visit: Payer: PPO | Attending: Rheumatology | Admitting: Rheumatology

## 2022-01-11 VITALS — BP 131/82 | HR 80 | Resp 16 | Ht 68.0 in | Wt 134.4 lb

## 2022-01-11 DIAGNOSIS — M0579 Rheumatoid arthritis with rheumatoid factor of multiple sites without organ or systems involvement: Secondary | ICD-10-CM

## 2022-01-11 DIAGNOSIS — M353 Polymyalgia rheumatica: Secondary | ICD-10-CM | POA: Diagnosis not present

## 2022-01-11 DIAGNOSIS — M170 Bilateral primary osteoarthritis of knee: Secondary | ICD-10-CM | POA: Diagnosis not present

## 2022-01-11 DIAGNOSIS — E559 Vitamin D deficiency, unspecified: Secondary | ICD-10-CM

## 2022-01-11 DIAGNOSIS — G629 Polyneuropathy, unspecified: Secondary | ICD-10-CM

## 2022-01-11 DIAGNOSIS — M47816 Spondylosis without myelopathy or radiculopathy, lumbar region: Secondary | ICD-10-CM

## 2022-01-11 DIAGNOSIS — K51818 Other ulcerative colitis with other complication: Secondary | ICD-10-CM | POA: Diagnosis not present

## 2022-01-11 DIAGNOSIS — M810 Age-related osteoporosis without current pathological fracture: Secondary | ICD-10-CM

## 2022-01-11 DIAGNOSIS — Z79899 Other long term (current) drug therapy: Secondary | ICD-10-CM

## 2022-01-11 DIAGNOSIS — Z7952 Long term (current) use of systemic steroids: Secondary | ICD-10-CM

## 2022-01-11 NOTE — Patient Instructions (Addendum)
Standing Labs We placed an order today for your standing lab work.   Please have your standing labs drawn in October and every 3 months  If possible, please have your labs drawn 2 weeks prior to your appointment so that the provider can discuss your results at your appointment.  Please note that you may see your imaging and lab results in [facility] before we have reviewed them. We may be awaiting multiple results to interpret others before contacting you. Please allow our office up to 72 hours to thoroughly review all of the results before contacting the office for clarification of your results.  We currently have open lab daily: Monday through Thursday from 1:30 PM-4:30 PM and Friday from 1:30 PM- 4:00 PM If possible, please come for your lab work on Monday, Thursday or Friday afternoons, as you may experience shorter wait times.   Effective March 15, 2022 the new lab hours will change to: Monday through Thursday from 1:30 PM-5:00 PM and Friday from 8:30 AM-12:00 PM If possible, please come for your lab work on Monday and Thursday afternoons, as you may experience shorter wait times.  Please be advised, all patients with office appointments requiring lab work will take precedent over walk-in lab work.    The office is located at [address], [place], [place], [place] [address] No appointment is necessary.   Labs are drawn by [facility]. Please bring your co-pay at the time of your lab draw.  You may receive a bill from [facility] for your lab work.  Please note if you are on Hydroxychloroquine and and an order has been placed for a Hydroxychloroquine level, you will need to have it drawn 4 hours or more after your last dose.  If you wish to have your labs drawn at another location, please call the office 24 hours in advance to send orders.  If you have any questions regarding directions or hours of operation,  please call [phone].   As a reminder, please drink plenty of water prior to  coming for your lab work. Thanks!  Vaccines You are taking a medication(s) that can suppress your immune system.  The following immunizations are recommended: Flu annually Covid-19  Td/Tdap (tetanus, diphtheria, pertussis) every 10 years Pneumonia (Prevnar 15 then Pneumovax 23 at least 1 year apart.  Alternatively, can take Prevnar 20 without needing additional dose) Shingrix: 2 doses from 4 weeks to 6 months apart  Please check with your PCP to make sure you are up to date.  If you have signs or symptoms of an infection or start antibiotics: First, call your PCP for workup of your infection. Hold your medication through the infection, until you complete your antibiotics, and until symptoms resolve if you take the following: Injectable medication (Actemra, Benlysta, Cimzia, Cosentyx, Enbrel, Humira, Kevzara, Orencia, Remicade, Simponi, Stelara, Taltz, Tremfya) Methotrexate Leflunomide (Arava) Mycophenolate (Cellcept) Morrie Sheldon, Olumiant, or Rinvoq

## 2022-01-12 ENCOUNTER — Ambulatory Visit: Payer: PPO | Admitting: Podiatry

## 2022-01-12 ENCOUNTER — Encounter: Payer: Self-pay | Admitting: Neurology

## 2022-01-12 DIAGNOSIS — G629 Polyneuropathy, unspecified: Secondary | ICD-10-CM | POA: Diagnosis not present

## 2022-01-16 NOTE — Progress Notes (Signed)
Subjective:   Patient ID: Terri Hudson, female   DOB: 71 y.o.   MRN: [id number]   HPI Chief Complaint  Patient presents with   Foot Problem    eval and treat neuropathy bilateral- patient is not taking any meds. Onser for about a year and a half. Not diabetic.     71 year old female presents the office with concerns of small fiber neuropathy.  Onset was about a year ago.  She is not diabetic.  She has been evaluated by neurology for this as well.  She is also follow-up with her rheumatologist.   Review of Systems  All other systems reviewed and are negative.  Past Medical History:  Diagnosis Date   Leukopenia    Migraine    Osteoarthritis    Osteoporosis 02/12/2018   Polymyalgia rheumatica ([place])    Ulcerative colitis ([place])    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[place])     Past Surgical History:  Procedure Laterality Date   COLONOSCOPY     HAND SURGERY Left    2015 pt reports cutting her hand accidentally and needing exploratory surgery to check for nerve damage   KNEE SURGERY Right      Current Outpatient Medications:    cholecalciferol (VITAMIN D3) 25 MCG (1000 UT) tablet, Take 1,000 Units by mouth daily., Disp: , Rfl:    leflunomide (ARAVA) 10 MG tablet, TAKE 1 TABLET BY MOUTH EVERY DAY, Disp: 90 tablet, Rfl: 0   naratriptan (AMERGE) 2.5 MG tablet, Take 2.5 mg by mouth as needed for migraine. Take one (1) tablet at onset of headache; if returns or does not resolve, may repeat after 4 hours; do not exceed five (5) mg in 24 hours., Disp: , Rfl:    sulfaSALAzine (AZULFIDINE) 500 MG tablet, Take 2 tablets (1,000 mg total) by mouth 2 (two) times daily., Disp: 360 tablet, Rfl: 0  No Known Allergies        Objective:  Physical Exam  General: AAO x3, NAD  Dermatological: Skin is warm, dry and supple bilateral. There are no open sores, no preulcerative lesions, no rash or signs of infection present.  Vascular: Dorsalis Pedis artery and Posterior Tibial artery pedal  pulses are 2/4 bilateral with immedate capillary fill time. There is no pain with calf compression, swelling, warmth, erythema.   Neruologic: Sensation appears to be intact with Thornell Mule monofilament however there is decreased vibratory sensation bilaterally.  Musculoskeletal: No area pinpoint tenderness.  Muscular strength 5/5 in all groups tested bilateral.  Gait: Unassisted, Nonantalgic.       Assessment:   Neuropathy     Plan:  -Treatment options discussed including all alternatives, risks, and complications. -Etiology of symptoms were discussed -We discussed different treatment options for neuropathy however prior to this was to confirm the diagnosis. Will proceed with a nerve biopsy likely next week.  We discussed the procedure as well as postoperative course. -She was asked for MRI of her back and other work-up.  We will start with a nerve biopsy.  If needed will defer back to her primary care doctor or neurologist to order an MRI of her back.  Trula Slade DPM

## 2022-01-17 DIAGNOSIS — H2512 Age-related nuclear cataract, left eye: Secondary | ICD-10-CM | POA: Diagnosis not present

## 2022-01-17 DIAGNOSIS — H25042 Posterior subcapsular polar age-related cataract, left eye: Secondary | ICD-10-CM | POA: Diagnosis not present

## 2022-01-17 DIAGNOSIS — H25012 Cortical age-related cataract, left eye: Secondary | ICD-10-CM | POA: Diagnosis not present

## 2022-01-17 DIAGNOSIS — H25812 Combined forms of age-related cataract, left eye: Secondary | ICD-10-CM | POA: Diagnosis not present

## 2022-02-07 ENCOUNTER — Ambulatory Visit: Payer: PPO | Admitting: Podiatry

## 2022-02-07 DIAGNOSIS — R2990 Unspecified symptoms and signs involving the nervous system: Secondary | ICD-10-CM | POA: Diagnosis not present

## 2022-02-07 DIAGNOSIS — G629 Polyneuropathy, unspecified: Secondary | ICD-10-CM | POA: Diagnosis not present

## 2022-02-10 NOTE — Progress Notes (Signed)
Subjective: Chief Complaint  Patient presents with   Nerve Biospy    Rm 14  Pt in office to for nerve biopsy of both lower extremities. Pt states no change in symptoms.    Peripheral Neuropathy    Pt states no changes.     71 year old female presents the above concerns.  She presents today for nerve biopsy.  No changes.   Objective: AAO x3, NAD DP/PT pulses palpable bilaterally, CRT less than 3 seconds Overall exam appears to be unchanged.  No area pinpoint tenderness.  No edema.  Flexor, extensor tendons appear to be intact. No pain with calf compression, swelling, warmth, erythema  Assessment: Concern for neuropathy  Plan: -All treatment options discussed with the patient including all alternatives, risks, complications.  -At this time we discussed doing a nerve biopsy.  She was to proceed with this.  We discussed the procedure as well as postoperative course including all alternatives, risks, complications.  Skin was cleaned with alcohol and a mixture of 1 cc lidocaine with epinephrine was infiltrated just proximal to the area of the biopsy sites bilaterally.  Skin was then prepped with Betadine.  10 cm proximal to the lateral malleolus I then took a punch biopsy of both the left and right legs.  This was properly labeled and placed into the specimen containers.  I irrigated the area with alcohol, saline and hemostasis achieved.  Antibiotic ointment was applied followed by dressing.  She tolerated the procedure well any complications.  Postprocedure instructions discussed. -Patient encouraged to call the office with any questions, concerns, change in symptoms.   Trula Slade DPM

## 2022-02-18 ENCOUNTER — Encounter: Payer: Self-pay | Admitting: Podiatry

## 2022-03-11 ENCOUNTER — Other Ambulatory Visit: Payer: Self-pay | Admitting: Nurse Practitioner

## 2022-03-13 ENCOUNTER — Other Ambulatory Visit: Payer: Self-pay

## 2022-03-13 MED ORDER — SULFASALAZINE 500 MG PO TABS: 1000.0000 mg | ORAL_TABLET | Freq: Two times a day (BID) | ORAL | 0 refills | Status: DC

## 2022-03-17 ENCOUNTER — Other Ambulatory Visit: Payer: Self-pay | Admitting: Rheumatology

## 2022-03-17 NOTE — Telephone Encounter (Signed)
Next Visit: 06/14/2022  Last Visit: 01/11/2022  Last Fill: 12/19/2021  DX: Rheumatoid arthritis involving multiple sites with positive rheumatoid factor   Current Dose per office note 01/11/2022: Leflunomide 10 mg p.o. daily   Labs: 11/28/2021 CBC and CMP WNL   Patient advised she is due to update lab work. Patient states she will come in Tuesday to update.   Okay to refill Arava?

## 2022-03-20 ENCOUNTER — Other Ambulatory Visit: Payer: Self-pay | Admitting: *Deleted

## 2022-03-20 DIAGNOSIS — Z79899 Other long term (current) drug therapy: Secondary | ICD-10-CM

## 2022-03-20 LAB — COMPLETE METABOLIC PANEL WITH GFR
AG Ratio: 1.8 (calc) (ref 1.0–2.5)
ALT: 16 U/L (ref 6–29)
AST: 24 U/L (ref 10–35)
Albumin: 4.6 g/dL (ref 3.6–5.1)
Alkaline phosphatase (APISO): 63 U/L (ref 37–153)
BUN: 16 mg/dL (ref 7–25)
CO2: 29 mmol/L (ref 20–32)
Calcium: 10.1 mg/dL (ref 8.6–10.4)
Chloride: 103 mmol/L (ref 98–110)
Creat: 0.81 mg/dL (ref 0.50–1.05)
Globulin: 2.6 g/dL (calc) (ref 1.9–3.7)
Glucose, Bld: 95 mg/dL (ref 65–99)
Potassium: 4.7 mmol/L (ref 3.5–5.3)
Sodium: 139 mmol/L (ref 135–146)
Total Bilirubin: 0.8 mg/dL (ref 0.2–1.2)
Total Protein: 7.2 g/dL (ref 6.1–8.1)
eGFR: 79 mL/min/{1.73_m2} (ref >=60)

## 2022-03-20 LAB — CBC WITH DIFFERENTIAL/PLATELET
Absolute Monocytes: 867 cells/uL (ref 200–950)
Basophils Absolute: 20 cells/uL (ref 0–200)
Basophils Relative: 0.4 %
Eosinophils Absolute: 137 cells/uL (ref 15–500)
Eosinophils Relative: 2.8 %
HCT: 38.8 % (ref 35.0–45.0)
Hemoglobin: 13.3 g/dL (ref 11.7–15.5)
Lymphs Abs: 1313 cells/uL (ref 850–3900)
MCH: 31.8 pg (ref 27.0–33.0)
MCHC: 34.3 g/dL (ref 32.0–36.0)
MCV: 92.8 fL (ref 80.0–100.0)
MPV: 10.8 fL (ref 7.5–12.5)
Monocytes Relative: 17.7 %
Neutro Abs: 2563 cells/uL (ref 1500–7800)
Neutrophils Relative %: 52.3 %
Platelets: 274 10*3/uL (ref 140–400)
RBC: 4.18 10*6/uL (ref 3.80–5.10)
RDW: 11.5 % (ref 11.0–15.0)
Total Lymphocyte: 26.8 %
WBC: 4.9 10*3/uL (ref 3.8–10.8)

## 2022-03-21 NOTE — Progress Notes (Signed)
CBC and CMP WNL

## 2022-03-27 ENCOUNTER — Encounter: Payer: Self-pay | Admitting: Podiatry

## 2022-03-29 DIAGNOSIS — Z5181 Encounter for therapeutic drug level monitoring: Secondary | ICD-10-CM | POA: Diagnosis not present

## 2022-03-29 DIAGNOSIS — Z79899 Other long term (current) drug therapy: Secondary | ICD-10-CM | POA: Diagnosis not present

## 2022-03-29 DIAGNOSIS — R739 Hyperglycemia, unspecified: Secondary | ICD-10-CM | POA: Diagnosis not present

## 2022-04-09 ENCOUNTER — Other Ambulatory Visit: Payer: Self-pay | Admitting: Physician Assistant

## 2022-04-10 DIAGNOSIS — Z0000 Encounter for general adult medical examination without abnormal findings: Secondary | ICD-10-CM | POA: Diagnosis not present

## 2022-04-10 DIAGNOSIS — G43109 Migraine with aura, not intractable, without status migrainosus: Secondary | ICD-10-CM | POA: Diagnosis not present

## 2022-04-10 DIAGNOSIS — K5180 Other ulcerative colitis without complications: Secondary | ICD-10-CM | POA: Diagnosis not present

## 2022-04-10 DIAGNOSIS — R202 Paresthesia of skin: Secondary | ICD-10-CM | POA: Diagnosis not present

## 2022-04-10 DIAGNOSIS — N183 Chronic kidney disease, stage 3 unspecified: Secondary | ICD-10-CM | POA: Diagnosis not present

## 2022-04-10 DIAGNOSIS — M353 Polymyalgia rheumatica: Secondary | ICD-10-CM | POA: Diagnosis not present

## 2022-04-10 NOTE — Telephone Encounter (Signed)
Next Visit: 06/14/2022   Last Visit: 01/11/2022   Last Fill: 03/17/2022 (30 day supply)   DX: Rheumatoid arthritis involving multiple sites with positive rheumatoid factor    Current Dose per office note 01/11/2022: Leflunomide 10 mg p.o. daily    Labs: 03/20/2022  CBC and CMP WNL    Okay to refill Arava?

## 2022-05-19 DIAGNOSIS — J40 Bronchitis, not specified as acute or chronic: Secondary | ICD-10-CM | POA: Diagnosis not present

## 2022-05-19 DIAGNOSIS — J0100 Acute maxillary sinusitis, unspecified: Secondary | ICD-10-CM | POA: Diagnosis not present

## 2022-05-19 DIAGNOSIS — R0602 Shortness of breath: Secondary | ICD-10-CM | POA: Diagnosis not present

## 2022-05-22 DIAGNOSIS — J329 Chronic sinusitis, unspecified: Secondary | ICD-10-CM | POA: Diagnosis not present

## 2022-05-22 DIAGNOSIS — R030 Elevated blood-pressure reading, without diagnosis of hypertension: Secondary | ICD-10-CM | POA: Diagnosis not present

## 2022-05-22 DIAGNOSIS — B9689 Other specified bacterial agents as the cause of diseases classified elsewhere: Secondary | ICD-10-CM | POA: Diagnosis not present

## 2022-05-22 DIAGNOSIS — R051 Acute cough: Secondary | ICD-10-CM | POA: Diagnosis not present

## 2022-05-22 DIAGNOSIS — R630 Anorexia: Secondary | ICD-10-CM | POA: Diagnosis not present

## 2022-05-22 DIAGNOSIS — Z03818 Encounter for observation for suspected exposure to other biological agents ruled out: Secondary | ICD-10-CM | POA: Diagnosis not present

## 2022-06-01 NOTE — Progress Notes (Signed)
Office Visit Note  Patient: Terri Hudson             Date of Birth: February 03, 1951           MRN: [id number]             PCP: Shon Hale, MD Referring: Shon Hale, * Visit Date: 06/14/2022 Occupation: @GUAROCC @  Subjective:  Medication management  History of Present Illness: Terri Hudson is a 72 y.o. female with history of seropositive rheumatoid arthritis, polymyalgia rheumatica, osteoarthritis and osteoporosis.  She has not had a flare of rheumatoid arthritis since the last visit.  She continues to have some stiffness in her joints.  She states she developed upper respiratory tract infection after the holidays which lingered for a while.  She came off leflunomide and sulfasalazine for about 2 weeks and then resumed them.  She did not experience a flare of rheumatoid arthritis.  She has not had a flare of polymyalgia rheumatica.  She has been off Fosamax since February 2022.  She has been taking calcium but she has not been taking vitamin D on a regular basis.  She has not had any flares of ulcerative colitis. She continues to have some neuropathy.    Activities of Daily Living:  Patient reports morning stiffness for 1 hour.   Patient Denies nocturnal pain.  Difficulty dressing/grooming: Denies Difficulty climbing stairs: Denies Difficulty getting out of chair: Denies Difficulty using hands for taps, buttons, cutlery, and/or writing: Denies  Review of Systems  Constitutional:  Negative for fatigue.  HENT:  Negative for mouth sores and mouth dryness.   Eyes:  Negative for dryness.  Respiratory:  Negative for shortness of breath.   Cardiovascular:  Negative for chest pain and palpitations.  Gastrointestinal:  Negative for blood in stool, constipation and diarrhea.  Endocrine: Negative for increased urination.  Genitourinary:  Negative for involuntary urination.  Musculoskeletal:  Positive for morning stiffness and muscle tenderness. Negative for  joint pain, gait problem, joint pain, joint swelling, myalgias, muscle weakness and myalgias.  Skin:  Negative for color change, rash, hair loss and sensitivity to sunlight.  Allergic/Immunologic: Negative for susceptible to infections.  Neurological:  Negative for dizziness and headaches.  Hematological:  Negative for swollen glands.  Psychiatric/Behavioral:  Negative for depressed mood and sleep disturbance. The patient is not nervous/anxious.     PMFS History:  Patient Active Problem List   Diagnosis Date Noted   Small fiber neuropathy 06/20/2021   Family history of amyloidosis 06/20/2021   Numbness and tingling of foot 07/28/2020   Rheumatoid arthritis involving multiple sites with positive rheumatoid factor (HCC) 06/15/2020   High risk medication use 11/20/2017   On prednisone therapy 11/20/2017   Primary osteoarthritis of both knees 11/20/2017   Spondylosis of lumbar spine 11/20/2017   Ulcerative colitis (HCC) 11/20/2017   Abnormal laboratory test 11/20/2017   Polymyalgia rheumatica (HCC) 06/07/2017    Past Medical History:  Diagnosis Date   Leukopenia    Migraine    Osteoarthritis    Osteoporosis 02/12/2018   Polymyalgia rheumatica (HCC)    Ulcerative colitis (HCC)    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HCC)     Family History  Problem Relation Age of Onset   Heart disease Mother    Osteoporosis Father    Barrett's esophagus Father    Cancer Father    Heart disease Father    Dermatomyositis Sister    Dislocations Maternal Grandmother    Migraines Daughter    Bowel  Disease Son    Colon cancer Neg Hx    Colon polyps Neg Hx    Esophageal cancer Neg Hx    Stomach cancer Neg Hx    Rectal cancer Neg Hx    Neuropathy Neg Hx    Past Surgical History:  Procedure Laterality Date   COLONOSCOPY     HAND SURGERY Left    2015 pt reports cutting her hand accidentally and needing exploratory surgery to check for nerve damage   KNEE SURGERY Right    Social History   Social  History Narrative   Lives at home with husband   Right handed   Caffeine: 2 cups/day   Immunization History  Administered Date(s) Administered   Fluad Quad(high Dose 65+) 01/14/2019   Influenza, High Dose Seasonal PF 03/06/2018, 01/28/2020, 03/04/2021   PFIZER Comirnaty(Gray Top)Covid-19 Tri-Sucrose Vaccine 09/30/2020   PFIZER(Purple Top)SARS-COV-2 Vaccination 06/04/2019, 06/25/2019, 02/05/2020   Pfizer Covid-19 Vaccine Bivalent Booster 22yrs & up 02/11/2021     Objective: Vital Signs: BP 124/79 (BP Location: Left Arm, Patient Position: Sitting, Cuff Size: Small)   Pulse 85   Resp 12   Ht 5\' 8"  (1.727 m)   Wt 133 lb 12.8 oz (60.7 kg)   BMI 20.34 kg/m    Physical Exam Vitals and nursing note reviewed.  Constitutional:      Appearance: She is well-developed.  HENT:     Head: Normocephalic and atraumatic.  Eyes:     Conjunctiva/sclera: Conjunctivae normal.  Cardiovascular:     Rate and Rhythm: Normal rate and regular rhythm.     Heart sounds: Normal heart sounds.  Pulmonary:     Effort: Pulmonary effort is normal.     Breath sounds: Normal breath sounds.  Abdominal:     General: Bowel sounds are normal.     Palpations: Abdomen is soft.  Musculoskeletal:     Cervical back: Normal range of motion.  Lymphadenopathy:     Cervical: No cervical adenopathy.  Skin:    General: Skin is warm and dry.     Capillary Refill: Capillary refill takes less than 2 seconds.  Neurological:     Mental Status: She is alert and oriented to person, place, and time.  Psychiatric:        Behavior: Behavior normal.      Musculoskeletal Exam: Cervical, thoracic and lumbar spine were in good range of motion.  Mild thoracic kyphosis was noted.  Shoulder joints, elbow joints, wrist joints, MCPs PIPs and DIPs were in good range of motion with no synovitis.  She had mild PIP and DIP thickening.  Hip joints and knee joints with good range of motion.  No warmth swelling or effusion was noted.  She  had no tenderness over ankles or MTPs.  CDAI Exam: CDAI Score: -- Patient Global: 2 mm; Provider Global: 2 mm Swollen: --; Tender: -- Joint Exam 06/14/2022   No joint exam has been documented for this visit   There is currently no information documented on the homunculus. Go to the Rheumatology activity and complete the homunculus joint exam.  Investigation: No additional findings.  Imaging: No results found.  Recent Labs: Lab Results  Component Value Date   WBC 4.9 03/20/2022   HGB 13.3 03/20/2022   PLT 274 03/20/2022   NA 139 03/20/2022   K 4.7 03/20/2022   CL 103 03/20/2022   CO2 29 03/20/2022   GLUCOSE 95 03/20/2022   BUN 16 03/20/2022   CREATININE 0.81 03/20/2022   BILITOT 0.8  03/20/2022   ALKPHOS 84 08/26/2019   AST 24 03/20/2022   ALT 16 03/20/2022   PROT 7.2 03/20/2022   ALBUMIN 4.6 08/26/2019   CALCIUM 10.1 03/20/2022   GFRAA 79 09/29/2020   QFTBGOLDPLUS NEGATIVE 08/26/2019    Speciality Comments: No specialty comments available.  Procedures:  No procedures performed Allergies: Patient has no known allergies.   Assessment / Plan:     Visit Diagnoses: Rheumatoid arthritis involving multiple sites with positive rheumatoid factor , +CCP Ab: Patient had no synovitis on examination.  She denies having a flare of rheumatoid arthritis.  She had to come off leflunomide and sulfasalazine December due to recent upper respiratory tract infection.  She states that she was off both medications for about 2 weeks but did not develop a flare.  High risk medication use - Leflunomide 10 mg p.o. daily and sulfasalazine 500 mg 2 tablets p.o.daily (prescribed by Dr. Hilarie Fredrickson). -Labs from March 20, 2022 CBC and CMP were stable.  Will get labs today and then every 3 months to monitor for drug toxicity.  Information regarding immunization was placed in the AVS.  She was advised to hold leflunomide and sulfasalazine if she develops an infection and resume after the infection  resolves.  Plan: CBC with Differential/Platelet, COMPLETE METABOLIC PANEL WITH GFR  Polymyalgia rheumatica ([place]) - Off prednisone since March 2022.  She had no muscular weakness or tenderness.  Other ulcerative colitis with other complication ([place]) -patient denies having a flare of ulcerative colitis.  Followed by Dr. Hilarie Fredrickson.  Primary osteoarthritis of both knees-she had good range of motion without discomfort.  Spondylosis of lumbar spine-she has intermittent discomfort.  She denies discomfort today.  She had good mobility in the lumbar spine.  She has some difficulty with forward flexion because of the tight hamstrings.  Small fiber neuropathy-followed by neurology.  Age-related osteoporosis without current pathological fracture - July 21, 2020: DXA, BMD  RFN  T score -2.3, BMD 0.591 lumbar.  T-scores improved.Treated with Fosamax for 1 year which was discontinued in February 22.  Will schedule repeat DEXA scan.  Use of calcium with vitamin D was discussed.  Regular exercise was emphasized.  Vitamin D deficiency -vitamin D was 37 in March 2023.  Patient states she has not been taking vitamin D.  Will check vitamin D level today.  Plan: VITAMIN D 25 Hydroxy (Vit-D Deficiency, Fractures)  Orders: Orders Placed This Encounter  Procedures   DG Bone Density   CBC with Differential/Platelet   COMPLETE METABOLIC PANEL WITH GFR   VITAMIN D 25 Hydroxy (Vit-D Deficiency, Fractures)   No orders of the defined types were placed in this encounter.    Follow-Up Instructions: Return in about 5 months (around 11/12/2022) for Rheumatoid arthritis.   Bo Merino, MD  Note - This record has been created using Editor, commissioning.  Chart creation errors have been sought, but may not always  have been located. Such creation errors do not reflect on  the standard of medical care.

## 2022-06-14 ENCOUNTER — Encounter: Payer: Self-pay | Admitting: Rheumatology

## 2022-06-14 ENCOUNTER — Ambulatory Visit: Payer: Medicare HMO | Attending: Rheumatology | Admitting: Rheumatology

## 2022-06-14 VITALS — BP 124/79 | HR 85 | Resp 12 | Ht 68.0 in | Wt 133.8 lb

## 2022-06-14 DIAGNOSIS — M353 Polymyalgia rheumatica: Secondary | ICD-10-CM

## 2022-06-14 DIAGNOSIS — G629 Polyneuropathy, unspecified: Secondary | ICD-10-CM | POA: Diagnosis not present

## 2022-06-14 DIAGNOSIS — M810 Age-related osteoporosis without current pathological fracture: Secondary | ICD-10-CM

## 2022-06-14 DIAGNOSIS — E559 Vitamin D deficiency, unspecified: Secondary | ICD-10-CM | POA: Diagnosis not present

## 2022-06-14 DIAGNOSIS — Z79899 Other long term (current) drug therapy: Secondary | ICD-10-CM

## 2022-06-14 DIAGNOSIS — M170 Bilateral primary osteoarthritis of knee: Secondary | ICD-10-CM

## 2022-06-14 DIAGNOSIS — M0579 Rheumatoid arthritis with rheumatoid factor of multiple sites without organ or systems involvement: Secondary | ICD-10-CM

## 2022-06-14 DIAGNOSIS — K51818 Other ulcerative colitis with other complication: Secondary | ICD-10-CM

## 2022-06-14 DIAGNOSIS — M47816 Spondylosis without myelopathy or radiculopathy, lumbar region: Secondary | ICD-10-CM

## 2022-06-14 NOTE — Patient Instructions (Signed)
Standing Labs We placed an order today for your standing lab work.   Please have your standing labs drawn in May and every 3 months  Please have your labs drawn 2 weeks prior to your appointment so that the provider can discuss your lab results at your appointment.  Please note that you may see your imaging and lab results in MyChart before we have reviewed them. We will contact you once all results are reviewed. Please allow our office up to 72 hours to thoroughly review all of the results before contacting the office for clarification of your results.  Lab hours are:   Monday through Thursday from 8:00 am -12:30 pm and 1:00 pm-5:00 pm and Friday from 8:00 am-12:00 pm.  Please be advised, all patients with office appointments requiring lab work will take precedent over walk-in lab work.   Labs are drawn by [facility]. Please bring your co-pay at the time of your lab draw.  You may receive a bill from [facility] for your lab work.  Please note if you are on Hydroxychloroquine and and an order has been placed for a Hydroxychloroquine level, you will need to have it drawn 4 hours or more after your last dose.  If you wish to have your labs drawn at another location, please call the office 24 hours in advance so we can fax the orders.  The office is located at [address], [address], , [place] [address] No appointment is necessary.    If you have any questions regarding directions or hours of operation,  please call [phone].   As a reminder, please drink plenty of water prior to coming for your lab work. Thanks!  Vaccines You are taking a medication(s) that can suppress your immune system.  The following immunizations are recommended: Flu annually Covid-19  RSV Td/Tdap (tetanus, diphtheria, pertussis) every 10 years Pneumonia (Prevnar 15 then Pneumovax 23 at least 1 year apart.  Alternatively, can take Prevnar 20 without needing additional dose) Shingrix: 2 doses from 4 weeks to  6 months apart If you have signs or symptoms of an infection or start antibiotics: First, call your PCP for workup of your infection. Hold your medication through the infection, until you complete your antibiotics, and until symptoms resolve if you take the following: Injectable medication (Actemra, Benlysta, Cimzia, Cosentyx, Enbrel, Humira, Kevzara, Orencia, Remicade, Simponi, Stelara, Taltz, Tremfya) Methotrexate Leflunomide (Arava) Mycophenolate (Cellcept) Xeljanz, Olumiant, or Rinvoq   Please check with your PCP to make sure you are up to date.  

## 2022-06-15 LAB — COMPLETE METABOLIC PANEL WITH GFR
AG Ratio: 1.6 (calc) (ref 1.0–2.5)
ALT: 15 U/L (ref 6–29)
AST: 20 U/L (ref 10–35)
Albumin: 4.3 g/dL (ref 3.6–5.1)
Alkaline phosphatase (APISO): 104 U/L (ref 37–153)
BUN: 13 mg/dL (ref 7–25)
CO2: 27 mmol/L (ref 20–32)
Calcium: 9.7 mg/dL (ref 8.6–10.4)
Chloride: 104 mmol/L (ref 98–110)
Creat: 0.77 mg/dL (ref 0.50–1.05)
Globulin: 2.7 g/dL (calc) (ref 1.9–3.7)
Glucose, Bld: 121 mg/dL — ABNORMAL HIGH (ref 65–99)
Potassium: 4.3 mmol/L (ref 3.5–5.3)
Sodium: 138 mmol/L (ref 135–146)
Total Bilirubin: 0.6 mg/dL (ref 0.2–1.2)
Total Protein: 7 g/dL (ref 6.1–8.1)
eGFR: 83 mL/min/{1.73_m2} (ref >=60)

## 2022-06-15 LAB — CBC WITH DIFFERENTIAL/PLATELET
Absolute Monocytes: 595 cells/uL (ref 200–950)
Basophils Absolute: 30 cells/uL (ref 0–200)
Basophils Relative: 0.6 %
Eosinophils Absolute: 150 cells/uL (ref 15–500)
Eosinophils Relative: 3 %
HCT: 39.2 % (ref 35.0–45.0)
Hemoglobin: 13.1 g/dL (ref 11.7–15.5)
Lymphs Abs: 1350 cells/uL (ref 850–3900)
MCH: 31.6 pg (ref 27.0–33.0)
MCHC: 33.4 g/dL (ref 32.0–36.0)
MCV: 94.5 fL (ref 80.0–100.0)
MPV: 10.9 fL (ref 7.5–12.5)
Monocytes Relative: 11.9 %
Neutro Abs: 2875 cells/uL (ref 1500–7800)
Neutrophils Relative %: 57.5 %
Platelets: 352 10*3/uL (ref 140–400)
RBC: 4.15 10*6/uL (ref 3.80–5.10)
RDW: 11.9 % (ref 11.0–15.0)
Total Lymphocyte: 27 %
WBC: 5 10*3/uL (ref 3.8–10.8)

## 2022-06-15 LAB — VITAMIN D 25 HYDROXY (VIT D DEFICIENCY, FRACTURES): Vit D, 25-Hydroxy: 33 ng/mL (ref 30–100)

## 2022-06-15 NOTE — Progress Notes (Signed)
CBC and CMP are normal except glucose is mildly elevated, probably not a fasting sample. Vitamin D is low normal at 33.  Patient should continue to take vitamin D 2000 units daily.

## 2022-07-06 DIAGNOSIS — L821 Other seborrheic keratosis: Secondary | ICD-10-CM | POA: Diagnosis not present

## 2022-07-06 DIAGNOSIS — L814 Other melanin hyperpigmentation: Secondary | ICD-10-CM | POA: Diagnosis not present

## 2022-07-06 DIAGNOSIS — H61021 Chronic perichondritis of right external ear: Secondary | ICD-10-CM | POA: Diagnosis not present

## 2022-07-08 ENCOUNTER — Other Ambulatory Visit: Payer: Self-pay | Admitting: Physician Assistant

## 2022-07-10 NOTE — Telephone Encounter (Signed)
Next Visit: 11/09/2022  Last Visit: 06/14/2022  Last Fill: 04/10/2022   DX: Rheumatoid arthritis involving multiple sites with positive rheumatoid factor , +CCP   Current Dose per office note 06/14/2022: Leflunomide 10 mg p.o. daily   Labs: 06/14/2022 CBC and CMP are normal except glucose is mildly elevated, probably not a fasting sample.   Okay to refill Arava?

## 2022-09-13 DIAGNOSIS — Z1231 Encounter for screening mammogram for malignant neoplasm of breast: Secondary | ICD-10-CM | POA: Diagnosis not present

## 2022-09-13 DIAGNOSIS — Z13820 Encounter for screening for osteoporosis: Secondary | ICD-10-CM | POA: Diagnosis not present

## 2022-09-13 DIAGNOSIS — M069 Rheumatoid arthritis, unspecified: Secondary | ICD-10-CM | POA: Diagnosis not present

## 2022-09-13 DIAGNOSIS — K589 Irritable bowel syndrome without diarrhea: Secondary | ICD-10-CM | POA: Diagnosis not present

## 2022-10-02 ENCOUNTER — Other Ambulatory Visit: Payer: Self-pay

## 2022-10-02 DIAGNOSIS — Z79899 Other long term (current) drug therapy: Secondary | ICD-10-CM | POA: Diagnosis not present

## 2022-10-03 LAB — COMPLETE METABOLIC PANEL WITH GFR
AG Ratio: 1.9 (calc) (ref 1.0–2.5)
ALT: 24 U/L (ref 6–29)
AST: 26 U/L (ref 10–35)
Albumin: 4.7 g/dL (ref 3.6–5.1)
Alkaline phosphatase (APISO): 60 U/L (ref 37–153)
BUN: 17 mg/dL (ref 7–25)
CO2: 27 mmol/L (ref 20–32)
Calcium: 10.2 mg/dL (ref 8.6–10.4)
Chloride: 102 mmol/L (ref 98–110)
Creat: 0.83 mg/dL (ref 0.50–1.05)
Globulin: 2.5 g/dL (calc) (ref 1.9–3.7)
Glucose, Bld: 140 mg/dL — ABNORMAL HIGH (ref 65–99)
Potassium: 4.3 mmol/L (ref 3.5–5.3)
Sodium: 138 mmol/L (ref 135–146)
Total Bilirubin: 0.9 mg/dL (ref 0.2–1.2)
Total Protein: 7.2 g/dL (ref 6.1–8.1)
eGFR: 76 mL/min/{1.73_m2} (ref >=60)

## 2022-10-03 LAB — CBC WITH DIFFERENTIAL/PLATELET
Absolute Monocytes: 716 cells/uL (ref 200–950)
Basophils Absolute: 42 cells/uL (ref 0–200)
Basophils Relative: 0.8 %
Eosinophils Absolute: 191 cells/uL (ref 15–500)
Eosinophils Relative: 3.6 %
HCT: 40.7 % (ref 35.0–45.0)
Hemoglobin: 13.1 g/dL (ref 11.7–15.5)
Lymphs Abs: 1484 cells/uL (ref 850–3900)
MCH: 30.3 pg (ref 27.0–33.0)
MCHC: 32.2 g/dL (ref 32.0–36.0)
MCV: 94.2 fL (ref 80.0–100.0)
MPV: 11.1 fL (ref 7.5–12.5)
Monocytes Relative: 13.5 %
Neutro Abs: 2867 cells/uL (ref 1500–7800)
Neutrophils Relative %: 54.1 %
Platelets: 244 10*3/uL (ref 140–400)
RBC: 4.32 10*6/uL (ref 3.80–5.10)
RDW: 11.5 % (ref 11.0–15.0)
Total Lymphocyte: 28 %
WBC: 5.3 10*3/uL (ref 3.8–10.8)

## 2022-10-03 NOTE — Progress Notes (Signed)
Glucose is 140. Rest of CMP WNL. CBC WNL

## 2022-10-11 DIAGNOSIS — Z0100 Encounter for examination of eyes and vision without abnormal findings: Secondary | ICD-10-CM | POA: Diagnosis not present

## 2022-10-11 DIAGNOSIS — H43811 Vitreous degeneration, right eye: Secondary | ICD-10-CM | POA: Diagnosis not present

## 2022-10-11 DIAGNOSIS — Z961 Presence of intraocular lens: Secondary | ICD-10-CM | POA: Diagnosis not present

## 2022-10-13 ENCOUNTER — Other Ambulatory Visit: Payer: Self-pay | Admitting: Physician Assistant

## 2022-10-13 NOTE — Telephone Encounter (Signed)
Last Fill: 07/10/2022  Labs: 10/02/2022 Glucose is 140. Rest of CMP WNL. CBC WNL   Next Visit: 11/09/2022  Last Visit: 06/14/2022  DX: Rheumatoid arthritis involving multiple sites with positive rheumatoid factor , +CCP Ab   Current Dose per office note 06/14/2022: Leflunomide 10 mg p.o. daily   Okay to refill Arava ?

## 2022-10-17 DIAGNOSIS — M353 Polymyalgia rheumatica: Secondary | ICD-10-CM | POA: Diagnosis not present

## 2022-10-17 DIAGNOSIS — R7309 Other abnormal glucose: Secondary | ICD-10-CM | POA: Diagnosis not present

## 2022-10-17 DIAGNOSIS — Z7969 Long term (current) use of other immunomodulators and immunosuppressants: Secondary | ICD-10-CM | POA: Diagnosis not present

## 2022-10-17 DIAGNOSIS — M0579 Rheumatoid arthritis with rheumatoid factor of multiple sites without organ or systems involvement: Secondary | ICD-10-CM | POA: Diagnosis not present

## 2022-10-17 DIAGNOSIS — K5190 Ulcerative colitis, unspecified, without complications: Secondary | ICD-10-CM | POA: Diagnosis not present

## 2022-10-17 DIAGNOSIS — D84821 Immunodeficiency due to drugs: Secondary | ICD-10-CM | POA: Diagnosis not present

## 2022-10-17 DIAGNOSIS — R200 Anesthesia of skin: Secondary | ICD-10-CM | POA: Diagnosis not present

## 2022-10-18 ENCOUNTER — Encounter: Payer: Self-pay | Admitting: Neurology

## 2022-11-09 ENCOUNTER — Ambulatory Visit: Payer: PPO | Admitting: Physician Assistant

## 2022-11-29 NOTE — Progress Notes (Unsigned)
Office Visit Note  Patient: Terri Hudson             Date of Birth: 1950/05/30           MRN: [id number]             PCP: Shon Hale, MD Referring: Shon Hale, * Visit Date: 12/13/2022 Occupation: @GUAROCC @  Subjective:  Medication monitoring  History of Present Illness: Terri Hudson is a 72 y.o. female with history of seropositive rheumatoid arthritis, polymyalgia rheumatica, and ulcerative colitis.  Patient is currently taking Leflunomide 10 mg p.o. daily and sulfasalazine 500 mg 2 tablets p.o.daily.  Patient is tolerating combination therapy without any side effects and has not had any recent gaps in therapy.  She denies any signs or symptoms of a rheumatoid arthritis or polymyalgia rheumatica flare.  She has not had any signs or symptoms of an ulcerative colitis flare.  Patient remains active exercising at the [facility] including aerobics without difficulty.  She has not had any morning stiffness or nocturnal pain.  She denies any recent or recurrent infections.  Patient states that she has been diagnosed with small fiber neuropathy and is under the care of neurology.  She denies any other new medical conditions.  Activities of Daily Living:  Patient reports morning stiffness for 0 minutes.   Patient Denies nocturnal pain.  Difficulty dressing/grooming: Denies Difficulty climbing stairs: Denies Difficulty getting out of chair: Denies Difficulty using hands for taps, buttons, cutlery, and/or writing: Denies  Review of Systems  Constitutional:  Negative for fatigue.  HENT:  Negative for mouth sores and mouth dryness.   Eyes:  Negative for dryness.  Respiratory:  Negative for shortness of breath.   Cardiovascular:  Negative for chest pain and palpitations.  Gastrointestinal:  Negative for blood in stool, constipation and diarrhea.  Endocrine: Negative for increased urination.  Genitourinary:  Negative for involuntary urination.  Musculoskeletal:   Negative for joint pain, gait problem, joint pain, joint swelling, myalgias, muscle weakness, morning stiffness, muscle tenderness and myalgias.  Skin:  Negative for color change, rash, hair loss and sensitivity to sunlight.  Allergic/Immunologic: Negative for susceptible to infections.  Neurological:  Negative for dizziness and headaches.  Hematological:  Negative for swollen glands.  Psychiatric/Behavioral:  Negative for depressed mood and sleep disturbance. The patient is not nervous/anxious.     PMFS History:  Patient Active Problem List   Diagnosis Date Noted   Small fiber neuropathy 06/20/2021   Family history of amyloidosis 06/20/2021   Numbness and tingling of foot 07/28/2020   Rheumatoid arthritis involving multiple sites with positive rheumatoid factor (HCC) 06/15/2020   High risk medication use 11/20/2017   On prednisone therapy 11/20/2017   Primary osteoarthritis of both knees 11/20/2017   Spondylosis of lumbar spine 11/20/2017   Ulcerative colitis (HCC) 11/20/2017   Abnormal laboratory test 11/20/2017   Polymyalgia rheumatica (HCC) 06/07/2017    Past Medical History:  Diagnosis Date   Leukopenia    Migraine    Osteoarthritis    Osteoporosis 02/12/2018   Polymyalgia rheumatica (HCC)    Ulcerative colitis (HCC)    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HCC)     Family History  Problem Relation Age of Onset   Heart disease Mother    Osteoporosis Father    Barrett's esophagus Father    Cancer Father    Heart disease Father    Dermatomyositis Sister    Dislocations Maternal Grandmother    Migraines Daughter    Bowel Disease  Son    Colon cancer Neg Hx    Colon polyps Neg Hx    Esophageal cancer Neg Hx    Stomach cancer Neg Hx    Rectal cancer Neg Hx    Neuropathy Neg Hx    Past Surgical History:  Procedure Laterality Date   COLONOSCOPY     HAND SURGERY Left    2015 pt reports cutting her hand accidentally and needing exploratory surgery to check for nerve damage    KNEE SURGERY Right    Social History   Social History Narrative   Lives at home with husband   Right handed   Caffeine: 2 cups/day   Immunization History  Administered Date(s) Administered   Fluad Quad(high Dose 65+) 01/14/2019   Influenza, High Dose Seasonal PF 03/06/2018, 01/28/2020, 03/04/2021   PFIZER Comirnaty(Gray Top)Covid-19 Tri-Sucrose Vaccine 09/30/2020   PFIZER(Purple Top)SARS-COV-2 Vaccination 06/04/2019, 06/25/2019, 02/05/2020   Pfizer Covid-19 Vaccine Bivalent Booster 29yrs & up 02/11/2021     Objective: Vital Signs: BP (!) 147/84 (BP Location: Left Arm, Patient Position: Sitting, Cuff Size: Normal)   Pulse 76   Resp 16   Ht 5\' 8"  (1.727 m)   Wt 134 lb 12.8 oz (61.1 kg)   BMI 20.50 kg/m    Physical Exam Vitals and nursing note reviewed.  Constitutional:      Appearance: She is well-developed.  HENT:     Head: Normocephalic and atraumatic.  Eyes:     Conjunctiva/sclera: Conjunctivae normal.  Cardiovascular:     Rate and Rhythm: Normal rate and regular rhythm.     Heart sounds: Normal heart sounds.  Pulmonary:     Effort: Pulmonary effort is normal.     Breath sounds: Normal breath sounds.  Abdominal:     General: Bowel sounds are normal.     Palpations: Abdomen is soft.  Musculoskeletal:     Cervical back: Normal range of motion.  Lymphadenopathy:     Cervical: No cervical adenopathy.  Skin:    General: Skin is warm and dry.     Capillary Refill: Capillary refill takes less than 2 seconds.  Neurological:     Mental Status: She is alert and oriented to person, place, and time.  Psychiatric:        Behavior: Behavior normal.      Musculoskeletal Exam: C-spine, thoracic spine, lumbar spine have good range of motion.  Shoulder joints, elbow joints, wrist joints, MCPs, PIPs, DIPs have good range of motion with no synovitis.  Complete fist formation bilaterally.  Hip joints have good range of motion with no groin pain.  No tenderness over the  trochanteric bursa bilaterally.  Knee joints have good range of motion with no warmth or effusion.  Ankle joints have good range of motion with no tenderness or joint swelling.  Thickening over the right first MTP joint.  Mild PIP and DIP thickening consistent with osteoarthritis of both feet.  No evidence of Achilles tendinitis or plantar fasciitis.  Patient Global: 20 / 100; Provider Global: 20 / 100 Swollen: --; Tender: -- Joint Exam 12/13/2022   No joint exam has been documented for this visit   There is currently no information documented on the homunculus. Go to the Rheumatology activity and complete the homunculus joint exam.  Investigation: No additional findings.  Imaging: No results found.  Recent Labs: Lab Results  Component Value Date   WBC 5.3 10/02/2022   HGB 13.1 10/02/2022   PLT 244 10/02/2022   NA 138 10/02/2022  K 4.3 10/02/2022   CL 102 10/02/2022   CO2 27 10/02/2022   GLUCOSE 140 (H) 10/02/2022   BUN 17 10/02/2022   CREATININE 0.83 10/02/2022   BILITOT 0.9 10/02/2022   ALKPHOS 84 08/26/2019   AST 26 10/02/2022   ALT 24 10/02/2022   PROT 7.2 10/02/2022   ALBUMIN 4.6 08/26/2019   CALCIUM 10.2 10/02/2022   GFRAA 79 09/29/2020   QFTBGOLDPLUS NEGATIVE 08/26/2019    Speciality Comments: No specialty comments available.  Procedures:  No procedures performed Allergies: Patient has no known allergies.   Assessment / Plan:     Visit Diagnoses: Rheumatoid arthritis involving multiple sites with positive rheumatoid factor , +CCP Ab: She has no joint tenderness or synovitis on examination today.  She has not had any signs or symptoms of a rheumatoid arthritis flare.  She has clinically been doing well taking Arava 10 mg daily and sulfasalazine 500 mg 2 tablets daily.  She is tolerating combination therapy without any side effects and has not had any interruptions in therapy.  No recent or recurrent infections.  No morning stiffness, nocturnal pain, or difficulty  with ADLs.  She continues to remain active exercising without difficulty.  She will remain on her Reyvow and sulfasalazine as combination therapy.  She is advised to notify us if she develops signs or symptoms of a flare.  High risk medication use - Arava 10 mg daily and sulfasalazine 500 mg 2 tablets p.o.daily (prescribed by Dr. Rhea Belton). CBC and CMP drawn on 10/02/2022.  Orders for CBC and CMP released today.  Her next lab work will be due at the end of October and every 3 months to monitor for drug toxicity.  Standing orders for CBC and CMP remain in place. No recent or recurrent infections.  Discussed the importance of holding arava and sulfasalazine if she develops signs or symptoms of an infection and to resume once the infection has completely cleared.  - Plan: CBC with Differential/Platelet, COMPLETE METABOLIC PANEL WITH GFR  Polymyalgia rheumatica (HCC) - Off prednisone since March 2022.  No signs or symptoms of a polymyalgia rheumatica flare.  No difficulty rising from a seated position or raising her arms above her head.  Other ulcerative colitis with other complication (HCC) - Followed by Dr. Rhea Belton.  She has not had any signs or symptoms of an ulcerative colitis flare.  Primary osteoarthritis of both knees: She has good range of motion of both knee joints on examination today.  No warmth or effusion noted.  Spondylosis of lumbar spine: Patient is not experiencing any increased discomfort in her lower back at this time.  No symptoms of radiculopathy.  Small fiber neuropathy: Under the care of neurology.  Age-related osteoporosis without current pathological fracture - July 21, 2020: DXA, BMD  RFN  T score -2.3, BMD 0.591 lumbar.  T-scores improved.Treated with Fosamax for 1 year which was discontinued in February 22. Plan to call [facility] to obtain most recent bone density which was updated in spring 2024.  Vitamin D deficiency: Patient is taking vitamin D 1000 units  daily.  Orders: Orders Placed This Encounter  Procedures   CBC with Differential/Platelet   COMPLETE METABOLIC PANEL WITH GFR   No orders of the defined types were placed in this encounter.    Follow-Up Instructions: Return in about 5 months (around 05/15/2023) for Rheumatoid arthritis, Polymyalgia Rheumatica.   Gearldine Bienenstock, PA-C  Note - This record has been created using Dragon software.  Chart creation errors have  been sought, but may not always  have been located. Such creation errors do not reflect on  the standard of medical care.

## 2022-12-13 ENCOUNTER — Encounter: Payer: Self-pay | Admitting: Physician Assistant

## 2022-12-13 ENCOUNTER — Ambulatory Visit: Payer: Medicare HMO | Attending: Physician Assistant | Admitting: Physician Assistant

## 2022-12-13 ENCOUNTER — Ambulatory Visit: Payer: Medicare HMO | Admitting: Adult Health

## 2022-12-13 ENCOUNTER — Telehealth: Payer: Self-pay | Admitting: Adult Health

## 2022-12-13 ENCOUNTER — Encounter: Payer: Self-pay | Admitting: Adult Health

## 2022-12-13 VITALS — BP 147/84 | HR 76 | Resp 16 | Ht 68.0 in | Wt 134.8 lb

## 2022-12-13 VITALS — BP 132/79 | HR 78 | Ht 68.0 in | Wt 134.8 lb

## 2022-12-13 DIAGNOSIS — R202 Paresthesia of skin: Secondary | ICD-10-CM

## 2022-12-13 DIAGNOSIS — K51818 Other ulcerative colitis with other complication: Secondary | ICD-10-CM

## 2022-12-13 DIAGNOSIS — M47816 Spondylosis without myelopathy or radiculopathy, lumbar region: Secondary | ICD-10-CM

## 2022-12-13 DIAGNOSIS — G629 Polyneuropathy, unspecified: Secondary | ICD-10-CM

## 2022-12-13 DIAGNOSIS — M170 Bilateral primary osteoarthritis of knee: Secondary | ICD-10-CM | POA: Diagnosis not present

## 2022-12-13 DIAGNOSIS — M0579 Rheumatoid arthritis with rheumatoid factor of multiple sites without organ or systems involvement: Secondary | ICD-10-CM

## 2022-12-13 DIAGNOSIS — R200 Anesthesia of skin: Secondary | ICD-10-CM | POA: Diagnosis not present

## 2022-12-13 DIAGNOSIS — M353 Polymyalgia rheumatica: Secondary | ICD-10-CM | POA: Diagnosis not present

## 2022-12-13 DIAGNOSIS — E559 Vitamin D deficiency, unspecified: Secondary | ICD-10-CM | POA: Diagnosis not present

## 2022-12-13 DIAGNOSIS — Z79899 Other long term (current) drug therapy: Secondary | ICD-10-CM | POA: Diagnosis not present

## 2022-12-13 DIAGNOSIS — M810 Age-related osteoporosis without current pathological fracture: Secondary | ICD-10-CM | POA: Diagnosis not present

## 2022-12-13 LAB — CBC WITH DIFFERENTIAL/PLATELET
Absolute Monocytes: 815 cells/uL (ref 200–950)
Basophils Absolute: 22 cells/uL (ref 0–200)
Basophils Relative: 0.4 %
Eosinophils Absolute: 162 cells/uL (ref 15–500)
Eosinophils Relative: 3 %
HCT: 41.3 % (ref 35.0–45.0)
Hemoglobin: 13.5 g/dL (ref 11.7–15.5)
Lymphs Abs: 1199 cells/uL (ref 850–3900)
MCH: 31.4 pg (ref 27.0–33.0)
MCHC: 32.7 g/dL (ref 32.0–36.0)
MCV: 96 fL (ref 80.0–100.0)
MPV: 11.2 fL (ref 7.5–12.5)
Monocytes Relative: 15.1 %
Neutro Abs: 3202 cells/uL (ref 1500–7800)
Neutrophils Relative %: 59.3 %
Platelets: 264 10*3/uL (ref 140–400)
RBC: 4.3 10*6/uL (ref 3.80–5.10)
RDW: 11.2 % (ref 11.0–15.0)
Total Lymphocyte: 22.2 %
WBC: 5.4 10*3/uL (ref 3.8–10.8)

## 2022-12-13 NOTE — Patient Instructions (Addendum)
Your Plan:  You will be called to schedule a MRI of your lumbar spine  May consider EMG/nerve conduction study after imaging completed - will further discuss this with Dr. Lucia Gaskins   If this does not show any concerning findings and you would like to be seen at a academic center, please let me know   You can try alpha lipoic acid 400-600mg  daily and after 1-2 weeks, increase to twice daily         Thank you for coming to see [place] at [facility]. I hope we have been able to provide you high quality care today.  You may receive a patient satisfaction survey over the next few weeks. We would appreciate your feedback and comments so that we may continue to improve ourselves and the health of our patients.

## 2022-12-13 NOTE — Telephone Encounter (Signed)
sent to [facility] they obtain [employer] medicare auth [id number] 

## 2022-12-13 NOTE — Progress Notes (Signed)
[facility] [address] [place]. [place] [address] [phone]       OFFICE FOLLOW UP NOTE  [name] Date of Birth:  [birth year] Medical Record Number:  [id number]    Primary neurologist: Dr. Lucia Gaskins Reason for visit: Neuropathy    SUBJECTIVE:   CHIEF COMPLAINT:  Chief Complaint  Patient presents with   Follow-up    Patient in room #8 an alone. Patient states she has tingling in both feet.    HPI:    Update 12/13/2022 JM: Returns for follow-up visit due to worsening neuropathy. At prior visit, she was referred to podiatry - did nerve biopsy which showed mild degenerative changes but not entirely specific per Dr. Ardelle Anton.  Did discuss neuropathy concerns with her rheumatologist who did not feel symptoms were related to her autoimmune disorders or medication use.  Previously discussed pursuing MRI lumbar spine with Dr. Lucia Gaskins and she wishes to proceed at this time.  Reports gradually worsening neuropathy more so in LLE with numbness/tingling now present up to mid calf, RLE remains below ankle.  Denies any low back pain or weakness in feet.  Denies specific painful sensation but more irritating.  Symptoms are constant.    History provided for reference purposes only Update 12/01/2021 Dr. Lucia Gaskins: Follow up small fiber neuropathy bilaterally: Genetic testing negative. Blood work largely unremarkable, risk factor includes hx of autoimmune disorder, we discussed multiple options see assessment and plan.   Patient complains of symptoms per HPI as well as the following symptoms: numbness bilat feet . Pertinent negatives and positives per HPI. All others negative   Update 06/20/2021 Dr. Lucia Gaskins: She has numbness on the bottom of the feet, started on the left foot and now to the right foot, in the toes and the ball of the foot. Doesn't feel it with shoes on, only in at bed at night, bothers about worrying here it Is going, not painful but strange, like falling  asleep. She does not have pain when walking or in the morning. Feels like a circulation problem, feet feel cold but Dr. Hyman Hopes feels like its circulation. No problems with back, no radicular symptoms. Father had amyloid protein. Father died of amyloidosis of the heart.    Pat testng hgba1c 4.8, b12/folate/b1 and b6 within normal limits, IFE nml   Patient complains of symptoms per HPI as well as the following symptoms: numbness and tinging in feet . Pertinent negatives and positives per HPI. All others negative   Consult visit 07/28/2020 Dr. Lucia Gaskins: Terri Hudson is a 72 y.o. female here as requested by Shirlean Mylar, MD for left lateral foot numbness. PMHx chronic kidney disease, osteoporosis, neuropathy, chronic fatigue, ulcerative colitis, migraine aura without headache, polymyalgia rheumatica, leukopenia.     Patient is here alone. States left foot numbness ongoing for about a year. More tingling. Rather than numbness across the ball of her foot. Left foot. The bottom of the foot. He has a hx of PMR and ulcerative colitis. She has been on many medications for other autoimmune diseases, been on steroids and others. Worsened over the last year but very slowly. No inciting events or new medications. No pain anywhere, feels weird like she has a piece of paper stuck on her foot. No back pain, no radicular symptoms, no ankle or knee pain or injuries. She had knee injury a long time ago, no time course relation. No weakness in the foot. No chemotherapy. No diabetes.  ROS:   14 system review of systems performed and negative with exception of those listed in HPI  PMH:  Past Medical History:  Diagnosis Date   Leukopenia    Migraine    Osteoarthritis    Osteoporosis 02/12/2018   Polymyalgia rheumatica (HCC)    Ulcerative colitis (HCC)    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HCC)     PSH:  Past Surgical History:  Procedure Laterality Date   COLONOSCOPY     HAND SURGERY Left    2015 pt  reports cutting her hand accidentally and needing exploratory surgery to check for nerve damage   KNEE SURGERY Right     Social History:  Social History   Socioeconomic History   Marital status: Married    Spouse name: Not on file   Number of children: 2   Years of education: Not on file   Highest education level: Bachelor's degree (e.g., BA, AB, BS)  Occupational History   Not on file  Tobacco Use   Smoking status: Never    Passive exposure: Never   Smokeless tobacco: Never  Vaping Use   Vaping status: Never Used  Substance and Sexual Activity   Alcohol use: Yes    Alcohol/week: 8.0 standard drinks of alcohol    Types: 7 Glasses of wine, 1 Standard drinks or equivalent per week    Comment: 1 glass/day   Drug use: Never   Sexual activity: Not on file  Other Topics Concern   Not on file  Social History Narrative   Lives at home with husband   Right handed   Caffeine: 2 cups/day   Social Determinants of Health   Financial Resource Strain: Not on file  Food Insecurity: Not on file  Transportation Needs: Not on file  Physical Activity: Not on file  Stress: Not on file  Social Connections: Not on file  Intimate Partner Violence: Not on file    Family History:  Family History  Problem Relation Age of Onset   Heart disease Mother    Osteoporosis Father    Barrett's esophagus Father    Cancer Father    Heart disease Father    Dermatomyositis Sister    Dislocations Maternal Grandmother    Migraines Daughter    Bowel Disease Son    Colon cancer Neg Hx    Colon polyps Neg Hx    Esophageal cancer Neg Hx    Stomach cancer Neg Hx    Rectal cancer Neg Hx    Neuropathy Neg Hx     Medications:   Current Outpatient Medications on File Prior to Visit  Medication Sig Dispense Refill   cholecalciferol (VITAMIN D3) 25 MCG (1000 UT) tablet Take 1,000 Units by mouth daily.     leflunomide (ARAVA) 10 MG tablet TAKE 1 TABLET BY MOUTH EVERY DAY 90 tablet 0   naratriptan  (AMERGE) 2.5 MG tablet Take 2.5 mg by mouth as needed for migraine. Take one (1) tablet at onset of headache; if returns or does not resolve, may repeat after 4 hours; do not exceed five (5) mg in 24 hours.     sulfaSALAzine (AZULFIDINE) 500 MG tablet Take 2 tablets (1,000 mg total) by mouth 2 (two) times daily. Please schedule an appointment for future refills. (Patient taking differently: Take 1,000 mg by mouth daily. Please schedule an appointment for future refills.) 360 tablet 0   No current facility-administered medications on file prior to visit.    Allergies:  No Known Allergies    OBJECTIVE:  Physical Exam  Vitals:   12/13/22 0918  BP: 132/79  Pulse: 78  Weight: 134 lb 12.8 oz (61.1 kg)  Height: 5\' 8"  (1.727 m)   Body mass index is 20.5 kg/m. No results found.  General: well developed, well nourished, seated, in no evident distress Head: head normocephalic and atraumatic.   Neck: supple with no carotid or supraclavicular bruits Cardiovascular: regular rate and rhythm, no murmurs Musculoskeletal: no deformity Skin:  no rash/petichiae Vascular:  Normal pulses all extremities   Neurologic Exam Mental Status: Awake and fully alert. Oriented to place and time. Recent and remote memory intact. Attention span, concentration and fund of knowledge appropriate. Mood and affect appropriate.  Cranial Nerves: Fundoscopic exam reveals sharp disc margins. Pupils equal, briskly reactive to light. Extraocular movements full without nystagmus. Visual fields full to confrontation. Hearing intact. Facial sensation intact. Face, tongue, palate moves normally and symmetrically.  Motor: Normal bulk and tone. Normal strength in all tested extremity muscles Sensory.:  Decreased pinprick sensation LLE up to bottom of knee, decreased pinprick sensation RLE mid foot.  Decreased temp and light touch sensation bilaterally laterally, intact medially.  Coordination: Rapid alternating movements normal  in all extremities. Finger-to-nose and heel-to-shin performed accurately bilaterally. Gait and Station: Arises from chair without difficulty. Stance is normal. Gait demonstrates normal stride length and balance without use of AD.  Reflexes: 1+ and symmetric. Toes downgoing.      ASSESSMENT: Terri Hudson is a 72 y.o. year old female with bilateral foot numbness/tingling over the past 3 years with gradual worsening R>L and sensory impairment on exam today.  No evidence of weakness.  Prior extensive lab work for reversible causes unremarkable as well as genetic testing for familial amyloidosis.     PLAN:  Lower extremity neuropathy:  ? Small fiber neuropathy:  Gradually progressive symptoms now with sensory changes Will order MRI lumbar for further evaluation Consider benefit of EMG/NCV after imaging completed Lab work for reversible causes unremarkable Genetic testing for familial amyloidosis negative Try alpha lipoic acid 400 to 600 mg daily then increase to BID if needed Consider evaluation at academic center -patient wishes to wait until MRI completed and then will make that decision     Follow-up will be determined after above completed    CC:  PCP: Shon Hale, MD    I spent 25 minutes of face-to-face and non-face-to-face time with patient.  This included previsit chart review including review of recent hospitalization, lab review, study review, order entry, electronic health record documentation, patient education and discussion regarding above diagnoses and treatment plan and answered all other questions to patient's satisfaction   Ihor Austin, [facility]  [facility] [address] [place], [place] [address]  Phone [phone] Fax [phone] Note: This document was prepared with digital dictation and possible smart phrase technology. Any transcriptional errors that result from this process are  unintentional.

## 2022-12-14 NOTE — Progress Notes (Signed)
Glucose is 110.  Rest of CMP WNL.  CBC WNL.

## 2022-12-15 ENCOUNTER — Telehealth: Payer: Self-pay | Admitting: *Deleted

## 2022-12-15 NOTE — Telephone Encounter (Signed)
Attempted to contact the patient and left message for patient to call the office.  

## 2022-12-15 NOTE — Telephone Encounter (Signed)
Received DEXA results from [facility].  Date of Scan: 09/13/2022  Lowest T-score: -2.4  BMD: 0.581  Lowest site measured: Left Femoral Neck  DX: Osteopenia  Significant changes in BMD and site measured (5% and above):5 % Left Total Femur, 9% AP Spine  Current Regimen:Vitamin D Has been treated for 1 year with Fosamax, discontinued 2022  Recommendation: Improved Left total femur and AP Spine. Recommend Calcium, Vitamin D and resistive exercises.   Reviewed by: Sherron Ales, PA-C  Next Appointment:  05/03/2023

## 2022-12-18 NOTE — Telephone Encounter (Signed)
Patient advised Improved Left total femur and AP Spine. Recommend Calcium, Vitamin D and resistive exercises.

## 2022-12-18 NOTE — Telephone Encounter (Signed)
Attempted to contact the patient and left message for patient to call the office.  

## 2022-12-26 ENCOUNTER — Encounter: Payer: Self-pay | Admitting: Adult Health

## 2023-01-05 ENCOUNTER — Ambulatory Visit
Admission: RE | Admit: 2023-01-05 | Discharge: 2023-01-05 | Disposition: A | Discharge status: Home / Self Care | Payer: Medicare HMO | Source: Ambulatory Visit | Attending: Adult Health | Admitting: Adult Health

## 2023-01-05 DIAGNOSIS — M5416 Radiculopathy, lumbar region: Secondary | ICD-10-CM | POA: Diagnosis not present

## 2023-01-05 DIAGNOSIS — R200 Anesthesia of skin: Secondary | ICD-10-CM

## 2023-01-05 DIAGNOSIS — R202 Paresthesia of skin: Secondary | ICD-10-CM

## 2023-01-17 ENCOUNTER — Other Ambulatory Visit: Payer: Self-pay | Admitting: Physician Assistant

## 2023-01-17 NOTE — Telephone Encounter (Signed)
Last Fill: 10/13/2022  Labs: 12/13/2022 Glucose is 110. Rest of CMP WNL. CBC WNL.  Next Visit: 05/03/2023  Last Visit: 12/13/2022  DX: Rheumatoid arthritis involving multiple sites with positive rheumatoid factor   Current Dose per office note 12/13/2022: Arava 10 mg daily   Okay to refill Arava ?

## 2023-03-23 ENCOUNTER — Encounter: Payer: Self-pay | Admitting: Adult Health

## 2023-03-23 DIAGNOSIS — M5416 Radiculopathy, lumbar region: Secondary | ICD-10-CM

## 2023-03-23 DIAGNOSIS — R202 Paresthesia of skin: Secondary | ICD-10-CM

## 2023-04-02 ENCOUNTER — Telehealth: Payer: Self-pay | Admitting: Adult Health

## 2023-04-02 NOTE — Telephone Encounter (Signed)
Referral for neurosurgery fax to [facility]. Phone: [phone], Fax: [phone] 

## 2023-04-16 DIAGNOSIS — N1831 Chronic kidney disease, stage 3a: Secondary | ICD-10-CM | POA: Diagnosis not present

## 2023-04-16 DIAGNOSIS — Z79899 Other long term (current) drug therapy: Secondary | ICD-10-CM | POA: Diagnosis not present

## 2023-04-16 DIAGNOSIS — R258 Other abnormal involuntary movements: Secondary | ICD-10-CM | POA: Diagnosis not present

## 2023-04-16 DIAGNOSIS — Z13220 Encounter for screening for lipoid disorders: Secondary | ICD-10-CM | POA: Diagnosis not present

## 2023-04-16 DIAGNOSIS — R202 Paresthesia of skin: Secondary | ICD-10-CM | POA: Diagnosis not present

## 2023-04-16 DIAGNOSIS — K5190 Ulcerative colitis, unspecified, without complications: Secondary | ICD-10-CM | POA: Diagnosis not present

## 2023-04-16 DIAGNOSIS — Z0000 Encounter for general adult medical examination without abnormal findings: Secondary | ICD-10-CM | POA: Diagnosis not present

## 2023-04-21 ENCOUNTER — Other Ambulatory Visit: Payer: Self-pay | Admitting: Physician Assistant

## 2023-04-23 NOTE — Telephone Encounter (Signed)
Last Fill: 01/17/2023  Labs: 12/13/2022 Glucose is 110. Rest of CMP WNL. CBC WNL.  Next Visit: 05/03/2023   Last Visit: 12/13/2022   DX: Rheumatoid arthritis involving multiple sites with positive rheumatoid factor   Current Dose per office note 12/13/2022: Arava 10 mg daily   Patient to update labs at upcoming appointment on 05/03/2023  Okay to refill Arava ?

## 2023-05-03 ENCOUNTER — Ambulatory Visit: Payer: Medicare HMO | Admitting: Physician Assistant

## 2023-05-07 NOTE — Progress Notes (Signed)
 Office Visit Note  Patient: Terri Hudson             Date of Birth: November 14, 1950           MRN: [id number]             PCP: Chrystal Lamarr RAMAN, MD Referring: Chrystal Lamarr RAMAN, * Visit Date: 05/21/2023 Occupation: @GUAROCC @  Subjective:  Medication monitoring   History of Present Illness: Terri Hudson is a 72 y.o. female with history of seropositive rheumatoid arthritis, polymyalgia rheumatica and osteoarthritis. Patient remains on Arava  10 mg daily and sulfasalazine  500 mg 2 tablets p.o.daily she is tolerating, nation therapy without any side effects.  She has not had any recent gaps in therapy.  She denies any signs or symptoms of a rheumatoid arthritis or polymyalgia rheumatica flare.  She denies any ulcerative colitis flares.  She is not currently experiencing any joint swelling.  She continues to have numbness and tingling in both legs primarily her left leg.  She was evaluated by neurology and has an upcoming appointment with neurosurgery in mid January 2025 for further evaluation.  According to the patient she had MRI which revealed a pinched nerve but is not currently having any discomfort in her lower back.  Patient remains active going to the gym on a regular basis without difficulty.   Activities of Daily Living:  Patient reports morning stiffness for 0 minute.   Patient Denies nocturnal pain.  Difficulty dressing/grooming: Denies Difficulty climbing stairs: Denies Difficulty getting out of chair: Denies Difficulty using hands for taps, buttons, cutlery, and/or writing: Denies  Review of Systems  Constitutional:  Negative for fatigue.  HENT:  Negative for mouth sores and mouth dryness.   Eyes:  Negative for dryness.  Respiratory:  Negative for shortness of breath.   Cardiovascular:  Negative for chest pain and palpitations.  Gastrointestinal:  Negative for blood in stool, constipation and diarrhea.  Endocrine: Negative for increased urination.   Genitourinary:  Negative for involuntary urination.  Musculoskeletal:  Negative for joint pain, gait problem, joint pain, joint swelling, myalgias, muscle weakness, morning stiffness, muscle tenderness and myalgias.  Skin:  Negative for color change, rash, hair loss and sensitivity to sunlight.  Allergic/Immunologic: Negative for susceptible to infections.  Neurological:  Negative for dizziness and headaches.  Hematological:  Negative for swollen glands.  Psychiatric/Behavioral:  Negative for depressed mood and sleep disturbance. The patient is not nervous/anxious.     PMFS History:  Patient Active Problem List   Diagnosis Date Noted   Small fiber neuropathy 06/20/2021   Family history of amyloidosis 06/20/2021   Numbness and tingling of foot 07/28/2020   Rheumatoid arthritis involving multiple sites with positive rheumatoid factor (HCC) 06/15/2020   High risk medication use 11/20/2017   On prednisone  therapy 11/20/2017   Primary osteoarthritis of both knees 11/20/2017   Spondylosis of lumbar spine 11/20/2017   Ulcerative colitis (HCC) 11/20/2017   Abnormal laboratory test 11/20/2017   Polymyalgia rheumatica (HCC) 06/07/2017    Past Medical History:  Diagnosis Date   Leukopenia    Migraine    Osteoarthritis    Osteoporosis 02/12/2018   Polymyalgia rheumatica (HCC)    Ulcerative colitis (HCC)    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HCC)     Family History  Problem Relation Age of Onset   Heart disease Mother    Osteoporosis Father    Barrett's esophagus Father    Cancer Father    Heart disease Father    Dermatomyositis Sister  Dislocations Maternal Grandmother    Migraines Daughter    Bowel Disease Son    Colon cancer Neg Hx    Colon polyps Neg Hx    Esophageal cancer Neg Hx    Stomach cancer Neg Hx    Rectal cancer Neg Hx    Neuropathy Neg Hx    Past Surgical History:  Procedure Laterality Date   COLONOSCOPY     HAND SURGERY Left    2015 pt reports cutting her  hand accidentally and needing exploratory surgery to check for nerve damage   KNEE SURGERY Right    Social History   Social History Narrative   Lives at home with husband   Right handed   Caffeine: 2 cups/day   Immunization History  Administered Date(s) Administered   Fluad Quad(high Dose 65+) 01/14/2019   Influenza, High Dose Seasonal PF 03/06/2018, 01/28/2020, 03/04/2021   PFIZER Comirnaty(Gray Top)Covid-19 Tri-Sucrose Vaccine 09/30/2020   PFIZER(Purple Top)SARS-COV-2 Vaccination 06/04/2019, 06/25/2019, 02/05/2020   Pfizer Covid-19 Vaccine Bivalent Booster 67yrs & up 02/11/2021     Objective: Vital Signs: BP 119/72 (BP Location: Left Arm, Patient Position: Sitting, Cuff Size: Normal)   Pulse 71   Resp 12   Ht 5' 8 (1.727 m)   Wt 139 lb (63 kg)   BMI 21.13 kg/m    Physical Exam Vitals and nursing note reviewed.  Constitutional:      Appearance: She is well-developed.  HENT:     Head: Normocephalic and atraumatic.  Eyes:     Conjunctiva/sclera: Conjunctivae normal.  Cardiovascular:     Rate and Rhythm: Normal rate and regular rhythm.     Heart sounds: Normal heart sounds.  Pulmonary:     Effort: Pulmonary effort is normal.     Breath sounds: Normal breath sounds.  Abdominal:     General: Bowel sounds are normal.     Palpations: Abdomen is soft.  Musculoskeletal:     Cervical back: Normal range of motion.  Lymphadenopathy:     Cervical: No cervical adenopathy.  Skin:    General: Skin is warm and dry.     Capillary Refill: Capillary refill takes less than 2 seconds.  Neurological:     Mental Status: She is alert and oriented to person, place, and time.  Psychiatric:        Behavior: Behavior normal.      Musculoskeletal Exam: C-spine, thoracic spine, lumbar spine and good range of motion.  Shoulder joints, elbow joints, wrist joints, MCPs, PIPs, DIPs have good range of motion with no synovitis.  Complete fist formation bilaterally.  Hip joints have good range  of motion with no groin pain.  Knee joints have good range of motion no warmth or effusion.  Ankle joints have good range of motion with no tenderness or joint swelling.  CDAI Exam: CDAI Score: -- Patient Global: --; Provider Global: -- Swollen: --; Tender: -- Joint Exam 05/21/2023   No joint exam has been documented for this visit   There is currently no information documented on the homunculus. Go to the Rheumatology activity and complete the homunculus joint exam.  Investigation: No additional findings.  Imaging: No results found.  Recent Labs: Lab Results  Component Value Date   WBC 5.4 12/13/2022   HGB 13.5 12/13/2022   PLT 264 12/13/2022   NA 138 12/13/2022   K 4.5 12/13/2022   CL 103 12/13/2022   CO2 25 12/13/2022   GLUCOSE 110 (H) 12/13/2022   BUN 20 12/13/2022  CREATININE 0.88 12/13/2022   BILITOT 0.7 12/13/2022   ALKPHOS 84 08/26/2019   AST 25 12/13/2022   ALT 22 12/13/2022   PROT 7.1 12/13/2022   ALBUMIN 4.6 08/26/2019   CALCIUM 10.4 12/13/2022   GFRAA 79 09/29/2020   QFTBGOLDPLUS NEGATIVE 08/26/2019    Speciality Comments: No specialty comments available.  Procedures:  No procedures performed Allergies: Patient has no known allergies.    Assessment / Plan:     Visit Diagnoses: Rheumatoid arthritis involving multiple sites with positive rheumatoid factor , +CCP Ab: She has no synovitis on examination today.  She has not had any signs or symptoms of a rheumatoid arthritis flare.  She has clinically been doing well taking Arava  10 mg 1 tablet by mouth daily and sulfasalazine  500 mg 2 tablets by mouth daily.  She is tolerating combination therapy without any side effects and has not had any recent gaps in therapy.  She has not been experiencing any morning stiffness, nocturnal pain, or difficulty performing ADLs.  She continues to go to the gym on a regular basis without difficulty.  Patient will remain on combination therapy as prescribed.  She was advised  to notify us  if she develops signs or symptoms of a flare.  She will follow-up in the office in 5 months or sooner if needed.  High risk medication use - Arava  10 mg daily and sulfasalazine  500 mg 2 tablets p.o.daily (prescribed by Dr. Albertus). CBC and CMP updated on 12/13/22.  Orders for CBC and CMP released today.  Her next lab work will be due in April and every 3 months to monitor for drug toxicity. No recurrent infections.  Discussed the importance of holding arava  and sulfasalazine  if she develops signs or symptoms of an infection and to resume once the infection has completely cleared.   - Plan: CBC with Differential/Platelet, COMPLETE METABOLIC PANEL WITH GFR  Polymyalgia rheumatica (HCC) - Off prednisone  since March 2022.  No signs or symptoms of a polymyalgia rheumatica flare. No difficulty raising arms above her head or rising from a seated position.  She has been going to the gym on a regular basis without difficulty.  Other ulcerative colitis with other complication (HCC) - Followed by Dr. Albertus: She has not had any signs or symptoms of an ulcerative colitis flare.  Primary osteoarthritis of both knees: No warmth or effusion noted on examination today.  Spondylosis of lumbar spine: MRI of the lumbar spine on 01/05/2023 revealed L4-L5 degenerative changes causing moderate left lateral recess narrowing encroaching upon the L5 nerve root without causing definite nerve root compression.  No spinal stenosis was noted.  Milder degenerative changes at L2-L3 and L3-L4 do not cause spinal stenosis or nerve root impingement.   Upcoming appointment with neurosurgery scheduled in mid-January 2025.   Small fiber neuropathy: Persistent-bilateral LE, left worse than right.  Evaluated by neurology on 12/13/2022.  MRI of the lumbar spine obtained on 01/05/2023--planning to see neurosurgery in mid January 2025.  Age-related osteoporosis without current pathological fracture: DEXA updated on 09/13/2022: Left  femoral neck BMD 0.581 with T-score -2.4.  Taking vitamin D  1000 units daily.  Vitamin D  deficiency: She is taking vitamin D  1000 units daily.  On prednisone  therapy: Not currently on prednisone .   Orders: Orders Placed This Encounter  Procedures   CBC with Differential/Platelet   COMPLETE METABOLIC PANEL WITH GFR   No orders of the defined types were placed in this encounter.     Follow-Up Instructions: Return in about  5 months (around 10/19/2023) for Rheumatoid arthritis, Polymyalgia Rheumatica, Osteoarthritis.   Waddell CHRISTELLA Craze, PA-C  Note - This record has been created using Dragon software.  Chart creation errors have been sought, but may not always  have been located. Such creation errors do not reflect on  the standard of medical care.

## 2023-05-17 DIAGNOSIS — R030 Elevated blood-pressure reading, without diagnosis of hypertension: Secondary | ICD-10-CM | POA: Diagnosis not present

## 2023-05-17 DIAGNOSIS — K130 Diseases of lips: Secondary | ICD-10-CM | POA: Diagnosis not present

## 2023-05-17 DIAGNOSIS — K5190 Ulcerative colitis, unspecified, without complications: Secondary | ICD-10-CM | POA: Diagnosis not present

## 2023-05-17 DIAGNOSIS — M0579 Rheumatoid arthritis with rheumatoid factor of multiple sites without organ or systems involvement: Secondary | ICD-10-CM | POA: Diagnosis not present

## 2023-05-17 DIAGNOSIS — N1831 Chronic kidney disease, stage 3a: Secondary | ICD-10-CM | POA: Diagnosis not present

## 2023-05-21 ENCOUNTER — Encounter: Payer: Self-pay | Admitting: Physician Assistant

## 2023-05-21 ENCOUNTER — Ambulatory Visit: Payer: Medicare HMO | Attending: Physician Assistant | Admitting: Physician Assistant

## 2023-05-21 VITALS — BP 119/72 | HR 71 | Resp 12 | Ht 68.0 in | Wt 139.0 lb

## 2023-05-21 DIAGNOSIS — M810 Age-related osteoporosis without current pathological fracture: Secondary | ICD-10-CM

## 2023-05-21 DIAGNOSIS — M170 Bilateral primary osteoarthritis of knee: Secondary | ICD-10-CM

## 2023-05-21 DIAGNOSIS — M0579 Rheumatoid arthritis with rheumatoid factor of multiple sites without organ or systems involvement: Secondary | ICD-10-CM | POA: Diagnosis not present

## 2023-05-21 DIAGNOSIS — Z79899 Other long term (current) drug therapy: Secondary | ICD-10-CM | POA: Diagnosis not present

## 2023-05-21 DIAGNOSIS — M47816 Spondylosis without myelopathy or radiculopathy, lumbar region: Secondary | ICD-10-CM

## 2023-05-21 DIAGNOSIS — K51818 Other ulcerative colitis with other complication: Secondary | ICD-10-CM

## 2023-05-21 DIAGNOSIS — E559 Vitamin D deficiency, unspecified: Secondary | ICD-10-CM | POA: Diagnosis not present

## 2023-05-21 DIAGNOSIS — M353 Polymyalgia rheumatica: Secondary | ICD-10-CM | POA: Diagnosis not present

## 2023-05-21 DIAGNOSIS — Z7952 Long term (current) use of systemic steroids: Secondary | ICD-10-CM | POA: Diagnosis not present

## 2023-05-21 DIAGNOSIS — G629 Polyneuropathy, unspecified: Secondary | ICD-10-CM

## 2023-05-21 NOTE — Patient Instructions (Signed)
 Standing Labs We placed an order today for your standing lab work.   Please have your standing labs drawn in April and every 3 months   Please have your labs drawn 2 weeks prior to your appointment so that the provider can discuss your lab results at your appointment, if possible.  Please note that you may see your imaging and lab results in MyChart before we have reviewed them. We will contact you once all results are reviewed. Please allow our office up to 72 hours to thoroughly review all of the results before contacting the office for clarification of your results.  WALK-IN LAB HOURS  Monday through Thursday from 8:00 am -12:30 pm and 1:00 pm-5:00 pm and Friday from 8:00 am-12:00 pm.  Patients with office visits requiring labs will be seen before walk-in labs.  You may encounter longer than normal wait times. Please allow additional time. Wait times may be shorter on  Monday and Thursday afternoons.  We do not book appointments for walk-in labs. We appreciate your patience and understanding with our staff.   Labs are drawn by [facility]. Please bring your co-pay at the time of your lab draw.  You may receive a bill from [facility] for your lab work.  Please note if you are on Hydroxychloroquine and and an order has been placed for a Hydroxychloroquine level,  you will need to have it drawn 4 hours or more after your last dose.  If you wish to have your labs drawn at another location, please call the office 24 hours in advance so we can fax the orders.  The office is located at [address], [place], [place] [address]   If you have any questions regarding directions or hours of operation,  please call [phone].   As a reminder, please drink plenty of water prior to coming for your lab work. Thanks!

## 2023-05-22 LAB — COMPLETE METABOLIC PANEL WITH GFR
AG Ratio: 1.8 (calc) (ref 1.0–2.5)
ALT: 17 U/L (ref 6–29)
AST: 22 U/L (ref 10–35)
Albumin: 4.4 g/dL (ref 3.6–5.1)
Alkaline phosphatase (APISO): 66 U/L (ref 37–153)
BUN: 14 mg/dL (ref 7–25)
CO2: 27 mmol/L (ref 20–32)
Calcium: 9.9 mg/dL (ref 8.6–10.4)
Chloride: 103 mmol/L (ref 98–110)
Creat: 0.88 mg/dL (ref 0.60–1.00)
Globulin: 2.5 g/dL (ref 1.9–3.7)
Glucose, Bld: 113 mg/dL — ABNORMAL HIGH (ref 65–99)
Potassium: 4.1 mmol/L (ref 3.5–5.3)
Sodium: 138 mmol/L (ref 135–146)
Total Bilirubin: 0.7 mg/dL (ref 0.2–1.2)
Total Protein: 6.9 g/dL (ref 6.1–8.1)
eGFR: 71 mL/min/{1.73_m2} (ref >=60)

## 2023-05-22 LAB — CBC WITH DIFFERENTIAL/PLATELET
Absolute Lymphocytes: 1848 {cells}/uL (ref 850–3900)
Absolute Monocytes: 684 {cells}/uL (ref 200–950)
Basophils Absolute: 30 {cells}/uL (ref 0–200)
Basophils Relative: 0.5 %
Eosinophils Absolute: 234 {cells}/uL (ref 15–500)
Eosinophils Relative: 3.9 %
HCT: 40.5 % (ref 35.0–45.0)
Hemoglobin: 13.3 g/dL (ref 11.7–15.5)
MCH: 30.9 pg (ref 27.0–33.0)
MCHC: 32.8 g/dL (ref 32.0–36.0)
MCV: 94.2 fL (ref 80.0–100.0)
MPV: 11 fL (ref 7.5–12.5)
Monocytes Relative: 11.4 %
Neutro Abs: 3204 {cells}/uL (ref 1500–7800)
Neutrophils Relative %: 53.4 %
Platelets: 326 10*3/uL (ref 140–400)
RBC: 4.3 10*6/uL (ref 3.80–5.10)
RDW: 11.5 % (ref 11.0–15.0)
Total Lymphocyte: 30.8 %
WBC: 6 10*3/uL (ref 3.8–10.8)

## 2023-05-22 NOTE — Progress Notes (Signed)
Glucose is 113.  Rest of CMP WNL.  CBC WNL.

## 2023-05-26 ENCOUNTER — Other Ambulatory Visit: Payer: Self-pay | Admitting: Rheumatology

## 2023-05-28 NOTE — Telephone Encounter (Signed)
 Last Fill: 04/23/2023 (30 day supply)  Labs: 05/21/2023 Glucose is 113. Rest of CMP WNL. CBC WNL.   Next Visit: 11/08/2023  Last Visit: 05/21/2023  DX: Rheumatoid arthritis involving multiple sites with positive rheumatoid factor , +CCP Ab   Current Dose per office note 05/21/2023: Arava  10 mg daily   Okay to refill Arava  ?

## 2023-06-06 NOTE — Progress Notes (Addendum)
Referring Physician:  Shon Hale, MD [address] [place],  [place] [address]  Primary Physician:  Shon Hale, MD  History of Present Illness: 06/07/2023 [name] is here today with a chief complaint of numbness in her legs and feet worse on the left than the right.  She says this is been going on for years but has worsened over the last couple of months without any particular inciting event.  She describes numbness and tingling from her mid shin into the top of her left foot with some involvement of the arch of her foot.  She has similar symptoms in her foot on the right.  It seems to be improved with activity and worsen with rest.  She denies any significant back pain or pain that radiates down the length of her legs.  Patient states she had biopsies of both of her ankles and was told she has neuropathy.  Conservative measures:  Physical therapy:  Eval on 12/08/2021? No follow up noted Multimodal medical therapy including regular antiinflammatories: none  Injections: no epidural steroid injections  Past Surgery: none  Terri Hudson has no symptoms of cervical myelopathy.  The symptoms are causing a significant impact on the patient's life.   Review of Systems:  A 10 point review of systems is negative, except for the pertinent positives and negatives detailed in the HPI.  Past Medical History: Past Medical History:  Diagnosis Date   Leukopenia    Migraine    Osteoarthritis    Osteoporosis 02/12/2018   Polymyalgia rheumatica (HCC)    Ulcerative colitis (HCC)    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HCC)     Past Surgical History: Past Surgical History:  Procedure Laterality Date   COLONOSCOPY     HAND SURGERY Left    2015 pt reports cutting her hand accidentally and needing exploratory surgery to check for nerve damage   KNEE SURGERY Right     Allergies: Allergies as of 06/07/2023   (No Known Allergies)     Medications: Outpatient Encounter Medications as of 06/07/2023  Medication Sig   cholecalciferol (VITAMIN D3) 25 MCG (1000 UT) tablet Take 1,000 Units by mouth daily.   leflunomide (ARAVA) 10 MG tablet TAKE 1 TABLET BY MOUTH EVERY DAY   naratriptan (AMERGE) 2.5 MG tablet Take 2.5 mg by mouth as needed for migraine. Take one (1) tablet at onset of headache; if returns or does not resolve, may repeat after 4 hours; do not exceed five (5) mg in 24 hours.   sulfaSALAzine (AZULFIDINE) 500 MG tablet Take 2 tablets (1,000 mg total) by mouth 2 (two) times daily. Please schedule an appointment for future refills. (Patient taking differently: Take 1,000 mg by mouth daily. Please schedule an appointment for future refills.)   No facility-administered encounter medications on file as of 06/07/2023.    Social History: Social History   Tobacco Use   Smoking status: Never    Passive exposure: Never   Smokeless tobacco: Never  Vaping Use   Vaping status: Never Used  Substance Use Topics   Alcohol use: Yes    Alcohol/week: 8.0 standard drinks of alcohol    Types: 7 Glasses of wine, 1 Standard drinks or equivalent per week    Comment: 1 glass/day   Drug use: Never    Family Medical History: Family History  Problem Relation Age of Onset   Heart disease Mother    Osteoporosis Father    Barrett's esophagus Father    Cancer Father  Heart disease Father    Dermatomyositis Sister    Dislocations Maternal Grandmother    Migraines Daughter    Bowel Disease Son    Colon cancer Neg Hx    Colon polyps Neg Hx    Esophageal cancer Neg Hx    Stomach cancer Neg Hx    Rectal cancer Neg Hx    Neuropathy Neg Hx     Physical Examination: Today's Vitals   06/07/23 1357  BP: 124/82  Weight: 63 kg  Height: 5\' 8"  (1.727 m)  PainSc: 0-No pain   Body mass index is 21.13 kg/m.   General: Patient is well developed, well nourished, calm, collected, and in no apparent distress. Attention to  examination is appropriate.  Psychiatric: Patient is non-anxious.  Head:  Pupils equal, round, and reactive to light.  ENT:  Oral mucosa appears well hydrated.  Neck:   Supple.    Respiratory: Patient is breathing without any difficulty.  Extremities: No edema.  Vascular: Palpable dorsal pedal pulses.  Skin:   On exposed skin, there are no abnormal skin lesions.  NEUROLOGICAL:     Awake, alert, oriented to person, place, and time.  Speech is clear and fluent. Fund of knowledge is appropriate.   Cranial Nerves: Pupils equal round and reactive to light.  Facial tone is symmetric.  Facial sensation is symmetric.  ROM of spine: full.  Palpation of spine: non tender.    Strength: Side Biceps Triceps Deltoid Interossei Grip Wrist Ext. Wrist Flex.  R 5 5 5 5 5 5 5   L 5 5 5 5 5 5 5    Side Iliopsoas Quads Hamstring PF DF EHL  R 5 5 5 5 5 5   L 5 5 5 5 5 5    Reflexes are 2+ and symmetric at the biceps, triceps, brachioradialis, patella and achilles.   Hoffman's is absent.  Clonus is not present.  Toes are down-going.  Bilateral upper and lower extremity sensation is intact to light touch.    Gait is normal.    Medical Decision Making  Imaging: MRI L spine 01/05/23    IMPRESSION: This MRI of the lumbar spine without contrast shows the following: At L4-L5, there are degenerative changes causing moderate left lateral recess narrowing encroaching upon the left L5 nerve root without causing definite nerve root compression.  No spinal stenosis. Milder degenerative changes at L2-L3 and L3-L4 do not cause spinal stenosis or nerve root impingement. I have personally reviewed the images and agree with the above interpretation.  Assessment and Plan: Terri Hudson is a pleasant 73 y.o. female with several year history of numbness and tingling in her feet left greater than right and shin worsening over the last couple of months.  Her MRI does show some lateral recess stenosis on the left  without any significant nerve root impingement.  I think that her lumbar changes are unlikely to be the cause of her current symptoms.  We briefly discussed the possibility of a peripheral neuropathy and discussed further diagnostic options including moving forward with an EMG versus lumbar ESI.  I do not feel given her symptoms and her lumbar MRI findings at the risk associated with an ESI are worth it at this juncture and offered to reach out to her neurologist Dr. Lucia Gaskins to discuss things further. I am awaiting her response.  I also briefly discussed the possibility of having her see Dr. Cline Cools as he is a peripheral nerve specialist.  I will contact the patient via  MyChart after Dr. Trevor Mace response with next steps.  She was encouraged to call the office in the interim with any questions or concerns.  She expressed understanding and was in agreement with this plan.   Thank you for involving me in the care of this patient.   I spent a total of 56 minutes in both face-to-face and non-face-to-face activities for this visit on the date of this encounter.   [facility]

## 2023-06-07 ENCOUNTER — Encounter: Payer: Self-pay | Admitting: Neurosurgery

## 2023-06-07 ENCOUNTER — Ambulatory Visit: Payer: Medicare HMO | Admitting: Neurosurgery

## 2023-06-07 VITALS — BP 124/82 | Ht 68.0 in | Wt 139.0 lb

## 2023-06-07 DIAGNOSIS — R200 Anesthesia of skin: Secondary | ICD-10-CM

## 2023-06-13 ENCOUNTER — Other Ambulatory Visit: Payer: Self-pay | Admitting: Neurology

## 2023-06-13 DIAGNOSIS — R200 Anesthesia of skin: Secondary | ICD-10-CM

## 2023-06-21 ENCOUNTER — Telehealth: Payer: Self-pay | Admitting: Adult Health

## 2023-06-21 NOTE — Telephone Encounter (Signed)
 Dr. Tresia Fruit, would you like to work this patient in somewhere or should we send her out to [facility] to have this completed?

## 2023-06-21 NOTE — Telephone Encounter (Signed)
 Pt called wanting to schedule her NCV/EMG. Please call when available.

## 2023-06-21 NOTE — Telephone Encounter (Signed)
 At this time I would put a hold on all emg slots and let me discuss with Dr. Gracie Lav and Jacqlyn Matas thank you

## 2023-06-26 NOTE — Telephone Encounter (Signed)
I called the patient and left a voicemail regarding an available appointment. If the patient calls back please schedule for 06/28/23 @ 9:30 AM, if available.

## 2023-06-27 NOTE — Telephone Encounter (Signed)
Attempted to call patient again there was no answer. A voicemail was left. I called and spoke with the patient's husband, Renae Fickle (per [facility]). I informed him we have been trying to contact her to schedule an appointment. He will relay the message to have her call back.

## 2023-06-27 NOTE — Telephone Encounter (Signed)
LVM asking pt to cb and schedule. If pt calls back, please consult with Megan C regarding scheduling. Spot that was offered is now blocked.

## 2023-06-29 NOTE — Telephone Encounter (Signed)
Pt returned call. Pt is needing to scheduled her NCV/EMG  Please advise.

## 2023-07-02 NOTE — Telephone Encounter (Signed)
 Forward to MD to discuss.

## 2023-07-02 NOTE — Telephone Encounter (Signed)
 LVM and sent mychart msg offering NCS appointment for 07/03/23 at 3pm  If patient calls back you can schedule her with Aundra Millet C for NCS at 3pm and Dr. Lucia Gaskins for an EMG at 4pm on 07/03/23

## 2023-07-02 NOTE — Telephone Encounter (Signed)
 Pt called back and informed phone rep that she is unable to come in tomorrow. Pt stated that she will be available in late February and all of March. I know this timing is difficult due to staffing changes. How should we proceed?

## 2023-07-09 NOTE — Telephone Encounter (Signed)
 Let her know we cannot perform the emg/ncs at this time. When we get spots open likely in April we can do it then so sorry

## 2023-07-30 ENCOUNTER — Encounter: Payer: Medicare HMO | Admitting: Neurology

## 2023-08-02 DIAGNOSIS — L821 Other seborrheic keratosis: Secondary | ICD-10-CM | POA: Diagnosis not present

## 2023-08-21 NOTE — Progress Notes (Signed)
 Full Name: Terri Hudson Gender: Female MRN #: [id number] Date of Birth: March 03, 1951    Visit Date: 08/23/2023 11:24 Age: 73 Years    History: Left leg has sensory changes on the bottom of the foot, the toe and the outer calf. Aaron A[facility] MRI 12/2022 Lumbar spine: At L4-L5, there are degenerative changes causing moderate left lateral recess narrowing encroaching upon the left L5 nerve root without causing definite nerve root compression. No low back pain or shooting pain down the leg.   Summary: NCS performed on bilateral lower extremities. EMG performed on the left lower extremity. Patient could not tolerate additional testing.All nerves and muscles (as indicated in the following tables) were within normal limits.    Conclusion: This is a limited (patient could not tolerate) but normal study. A small fiber neuropathy could be present despite a normal examination,   ------------------------------- Aldona Amel, M.D.  [facility] [address] [place], [place] [address] Tel: [phone] Fax: [phone]  Verbal informed consent was obtained from the patient, patient was informed of potential risk of procedure, including bruising, bleeding, hematoma formation, infection, muscle weakness, muscle pain, numbness, among others.        MNC    Nerve / Sites Muscle Latency Ref. Amplitude Ref. Rel Amp Segments Distance Velocity Ref. Area    ms ms mV mV %  cm m/s m/s mVms  L Peroneal - EDB     Ankle EDB 6.4 <=6.5 3.0 >=2.0 100 Ankle - EDB 9   8.5     Fib head EDB 13.8  2.2  72.7 Fib head - Ankle   >=44 6.6     Pop fossa EDB 15.2  4.1  186 Pop fossa - Fib head 8 56 >=44 11.3         Pop fossa - Ankle      L Tibial - AH     Ankle AH 5.8 <=5.8 6.5 >=4.0 100 Ankle - AH 9   14.1     Pop fossa AH 14.1  3.9  60.6 Pop fossa - Ankle 36 43 >=41 11.1         SNC    Nerve / Sites Rec. Site Peak Lat Ref.  Amp Ref. Segments Distance    ms ms V V  cm  R Sural - Ankle (Calf)      Calf Ankle 2.1 <=4.4 13 >=6 Calf - Ankle 14  L Sural - Ankle (Calf)     Calf Ankle 2.2 <=4.4 13 >=6 Calf - Ankle 14  R Superficial peroneal - Ankle     Lat leg Ankle 3.0 <=4.4 18 >=6 Lat leg - Ankle 14  L Superficial peroneal - Ankle     Lat leg Ankle 2.9 <=4.4 11 >=6 Lat leg - Ankle 14                   H Reflex    Nerve H Lat Lat Hmax   ms ms   Left Right Ref. Left Right Ref.  Tibial - Soleus 35.0 Could not tolerate  <=35.0 20.9 Could not tolerate    <=35.0         EMG Summary Table   Patient could not tolerate any further muscles Spontaneous MUAP Recruitment  Muscle IA Fib PSW Fasc Other Amp Dur. Poly Pattern  L. Vastus medialis Normal None None None _______ Normal Normal Normal Normal  L. Tibialis anterior Normal None None None _______ Normal Normal  Normal Normal  L. Gastrocnemius (Medial head) Normal None None None _______ Normal Normal Normal Normal  L. Extensor hallucis longus Normal None None None _______ Normal Normal Normal Normal  L. Abductor hallucis Normal None None None _______ Normal Normal Normal Normal  L. Lumbar paraspinals (low) Normal None None None _______ Normal Normal Normal Normal  L. Biceps femoris (long head) Normal None None None _______ Normal Normal Normal Normal

## 2023-08-23 ENCOUNTER — Ambulatory Visit: Payer: Medicare HMO | Admitting: Neurology

## 2023-08-23 DIAGNOSIS — R200 Anesthesia of skin: Secondary | ICD-10-CM | POA: Diagnosis not present

## 2023-08-23 MED ORDER — GABAPENTIN 100 MG PO CAPS: 100.0000 mg | ORAL_CAPSULE | Freq: Every day | ORAL | 1 refills | Status: DC

## 2023-08-23 NOTE — Patient Instructions (Addendum)
 https://www.the-rheumatologist.org/article/small-fiber-neuropathy-for-the-[email or web address]/[email or web address]/  Gabapentin Capsules or Tablets What is this medication? GABAPENTIN (GA ba pen tin) treats nerve pain. It may also be used to prevent and control seizures in people with epilepsy. It works by calming overactive nerves in your body. This medicine may be used for other purposes; ask your health care provider or pharmacist if you have questions. COMMON BRAND NAME(S): Active-PAC with Gabapentin, Ascencion Dike, Gralise, Neurontin What should I tell my care team before I take this medication? They need to know if you have any of these conditions: Kidney disease Lung or breathing disease Substance use disorder Suicidal thoughts, plans, or attempt by you or a family member An unusual or allergic reaction to gabapentin, other medications, foods, dyes, or preservatives Pregnant or trying to get pregnant Breastfeeding How should I use this medication? Take this medication by mouth with a glass of water. Follow the directions on the prescription label. You can take it with or without food. If it upsets your stomach, take it with food. Take your medication at regular intervals. Do not take it more often than directed. Do not stop taking except on your care team's advice. If you are directed to break the 600 or 800 mg tablets in half as part of your dose, the extra half tablet should be used for the next dose. If you have not used the extra half tablet within 28 days, it should be thrown away. A special MedGuide will be given to you by the pharmacist with each prescription and refill. Be sure to read this information carefully each time. Talk to your care team about the use of this medication in children. While this medication may be prescribed for children as young as 3 years for selected conditions, precautions do apply. Overdosage: If you think you have taken too much of this medicine contact a poison control center or  emergency room at once. NOTE: This medicine is only for you. Do not share this medicine with others. What if I miss a dose? If you miss a dose, take it as soon as you can. If it is almost time for your next dose, take only that dose. Do not take double or extra doses. What may interact with this medication? Alcohol Antihistamines for allergy, cough, and cold Certain medications for anxiety or sleep Certain medications for depression like amitriptyline, fluoxetine, sertraline Certain medications for seizures like phenobarbital, primidone Certain medications for stomach problems General anesthetics like halothane, isoflurane, methoxyflurane, propofol Local anesthetics like lidocaine, pramoxine, tetracaine Medications that relax muscles for surgery Opioid medications for pain Phenothiazines like chlorpromazine, mesoridazine, prochlorperazine, thioridazine This list may not describe all possible interactions. Give your health care provider a list of all the medicines, herbs, non-prescription drugs, or dietary supplements you use. Also tell them if you smoke, drink alcohol, or use illegal drugs. Some items may interact with your medicine. What should I watch for while using this medication? Visit your care team for regular checks on your progress. You may want to keep a record at home of how you feel your condition is responding to treatment. You may want to share this information with your care team at each visit. You should contact your care team if your seizures get worse or if you have any new types of seizures. Do not stop taking this medication or any of your seizure medications unless instructed by your care team. Stopping your medication suddenly can increase your seizures or their severity. This medication may cause serious skin reactions. They can happen  weeks to months after starting the medication. Contact your care team right away if you notice fevers or flu-like symptoms with a rash. The  rash may be red or purple and then turn into blisters or peeling of the skin. Or, you might notice a red rash with swelling of the face, lips or lymph nodes in your neck or under your arms. Wear a medical identification bracelet or chain if you are taking this medication for seizures. Carry a card that lists all your medications. This medication may affect your coordination, reaction time, or judgment. Do not drive or operate machinery until you know how this medication affects you. Sit up or stand slowly to reduce the risk of dizzy or fainting spells. Drinking alcohol with this medication can increase the risk of these side effects. Your mouth may get dry. Chewing sugarless gum or sucking hard candy, and drinking plenty of water may help. Watch for new or worsening thoughts of suicide or depression. This includes sudden changes in mood, behaviors, or thoughts. These changes can happen at any time but are more common in the beginning of treatment or after a change in dose. Call your care team right away if you experience these thoughts or worsening depression. If you become pregnant while using this medication, you may enroll in the [place] Antiepileptic Drug Pregnancy Registry by calling [phone]. This registry collects information about the safety of antiepileptic medication use during pregnancy. What side effects may I notice from receiving this medication? Side effects that you should report to your care team as soon as possible: Allergic reactions or angioedema--skin rash, itching, hives, swelling of the face, eyes, lips, tongue, arms, or legs, trouble swallowing or breathing Rash, fever, and swollen lymph nodes Thoughts of suicide or self harm, worsening mood, feelings of depression Trouble breathing Unusual changes in mood or behavior in children after use such as difficulty concentrating, hostility, or restlessness Side effects that usually do not require medical attention (report  to your care team if they continue or are bothersome): Dizziness Drowsiness Nausea Swelling of ankles, feet, or hands Vomiting This list may not describe all possible side effects. Call your doctor for medical advice about side effects. You may report side effects to FDA at 1-800-FDA-1088. Where should I keep my medication? Keep out of reach of children and pets. Store at room temperature between 15 and 30 degrees C (59 and 86 degrees F). Get rid of any unused medication after the expiration date. This medication may cause accidental overdose and death if taken by other adults, children, or pets. To get rid of medications that are no longer needed or have expired: Take the medication to a medication take-back program. Check with your pharmacy or law enforcement to find a location. If you cannot return the medication, check the label or package insert to see if the medication should be thrown out in the garbage or flushed down the toilet. If you are not sure, ask your care team. If it is safe to put it in the trash, empty the medication out of the container. Mix the medication with cat litter, dirt, coffee grounds, or other unwanted substance. Seal the mixture in a bag or container. Put it in the trash. NOTE: This sheet is a summary. It may not cover all possible information. If you have questions about this medicine, talk to your doctor, pharmacist, or health care provider.  2024 Elsevier/Gold Standard (2022-02-14 00:00:00)

## 2023-08-28 NOTE — Procedures (Signed)
 Full Name: Terri Hudson Gender: Female MRN #: [id number] Date of Birth: March 03, 1951    Visit Date: 08/23/2023 11:24 Age: 73 Years    History: Left leg has sensory changes on the bottom of the foot, the toe and the outer calf. Aaron Aas MRI 12/2022 Lumbar spine: At L4-L5, there are degenerative changes causing moderate left lateral recess narrowing encroaching upon the left L5 nerve root without causing definite nerve root compression. No low back pain or shooting pain down the leg.   Summary: NCS performed on bilateral lower extremities. EMG performed on the left lower extremity. Patient could not tolerate additional testing.All nerves and muscles (as indicated in the following tables) were within normal limits.    Conclusion: This is a limited (patient could not tolerate) but normal study. A small fiber neuropathy could be present despite a normal examination,   ------------------------------- Aldona Amel, M.D.  [facility] [address] [place], [place] [address] Tel: [phone] Fax: [phone]  Verbal informed consent was obtained from the patient, patient was informed of potential risk of procedure, including bruising, bleeding, hematoma formation, infection, muscle weakness, muscle pain, numbness, among others.        MNC    Nerve / Sites Muscle Latency Ref. Amplitude Ref. Rel Amp Segments Distance Velocity Ref. Area    ms ms mV mV %  cm m/s m/s mVms  L Peroneal - EDB     Ankle EDB 6.4 <=6.5 3.0 >=2.0 100 Ankle - EDB 9   8.5     Fib head EDB 13.8  2.2  72.7 Fib head - Ankle   >=44 6.6     Pop fossa EDB 15.2  4.1  186 Pop fossa - Fib head 8 56 >=44 11.3         Pop fossa - Ankle      L Tibial - AH     Ankle AH 5.8 <=5.8 6.5 >=4.0 100 Ankle - AH 9   14.1     Pop fossa AH 14.1  3.9  60.6 Pop fossa - Ankle 36 43 >=41 11.1         SNC    Nerve / Sites Rec. Site Peak Lat Ref.  Amp Ref. Segments Distance    ms ms V V  cm  R Sural - Ankle (Calf)      Calf Ankle 2.1 <=4.4 13 >=6 Calf - Ankle 14  L Sural - Ankle (Calf)     Calf Ankle 2.2 <=4.4 13 >=6 Calf - Ankle 14  R Superficial peroneal - Ankle     Lat leg Ankle 3.0 <=4.4 18 >=6 Lat leg - Ankle 14  L Superficial peroneal - Ankle     Lat leg Ankle 2.9 <=4.4 11 >=6 Lat leg - Ankle 14                   H Reflex    Nerve H Lat Lat Hmax   ms ms   Left Right Ref. Left Right Ref.  Tibial - Soleus 35.0 Could not tolerate  <=35.0 20.9 Could not tolerate    <=35.0         EMG Summary Table   Patient could not tolerate any further muscles Spontaneous MUAP Recruitment  Muscle IA Fib PSW Fasc Other Amp Dur. Poly Pattern  L. Vastus medialis Normal None None None _______ Normal Normal Normal Normal  L. Tibialis anterior Normal None None None _______ Normal Normal  Normal Normal  L. Gastrocnemius (Medial head) Normal None None None _______ Normal Normal Normal Normal  L. Extensor hallucis longus Normal None None None _______ Normal Normal Normal Normal  L. Abductor hallucis Normal None None None _______ Normal Normal Normal Normal  L. Lumbar paraspinals (low) Normal None None None _______ Normal Normal Normal Normal  L. Biceps femoris (long head) Normal None None None _______ Normal Normal Normal Normal

## 2023-09-03 ENCOUNTER — Encounter: Payer: Self-pay | Admitting: Neurology

## 2023-09-19 DIAGNOSIS — Z1231 Encounter for screening mammogram for malignant neoplasm of breast: Secondary | ICD-10-CM | POA: Diagnosis not present

## 2023-11-06 NOTE — Progress Notes (Signed)
 Office Visit Note  Patient: Terri Hudson             Date of Birth: [birth year]           MRN: [id number]             PCP: Chrystal Lamarr RAMAN, MD Referring: Chrystal Lamarr RAMAN, * Visit Date: 11/20/2023 Occupation: @GUAROCC @  Subjective:  Medication management  History of Present Illness: Terri Hudson is a 73 y.o. female with seropositive rheumatoid arthritis, polymyalgia rheumatica and osteoarthritis.  She returns today after her last visit in January 2025.  She been on sulfasalazine  500 mg 2 tablets p.o. daily and Arava  10 mg p.o. daily.  She states she discontinued Arava  about 2 weeks ago as she was on a trip and forgot to take it with her.  She has not noticed any flares of arthritis or PMR.  She has not had any flares of ulcerative colitis.  She would like to stop leflunomide .    Activities of Daily Living:  Patient reports morning stiffness for  none.   Patient Denies nocturnal pain.  Difficulty dressing/grooming: Denies Difficulty climbing stairs: Denies Difficulty getting out of chair: Denies Difficulty using hands for taps, buttons, cutlery, and/or writing: Denies  Review of Systems  Constitutional:  Negative for fatigue.  HENT:  Negative for mouth sores and mouth dryness.   Eyes:  Negative for dryness.  Respiratory:  Negative for shortness of breath.   Cardiovascular:  Negative for chest pain and palpitations.  Gastrointestinal:  Negative for blood in stool, constipation and diarrhea.  Endocrine: Negative for increased urination.  Genitourinary:  Negative for involuntary urination.  Musculoskeletal:  Negative for joint pain, gait problem, joint pain, joint swelling, myalgias, muscle weakness, morning stiffness, muscle tenderness and myalgias.  Skin:  Negative for color change, rash, hair loss and sensitivity to sunlight.  Allergic/Immunologic: Negative for susceptible to infections.  Neurological:  Negative for dizziness and headaches.   Hematological:  Negative for swollen glands.  Psychiatric/Behavioral:  Negative for depressed mood and sleep disturbance. The patient is not nervous/anxious.     PMFS History:  Patient Active Problem List   Diagnosis Date Noted   Small fiber neuropathy 06/20/2021   Family history of amyloidosis 06/20/2021   Numbness and tingling of foot 07/28/2020   Rheumatoid arthritis involving multiple sites with positive rheumatoid factor (HCC) 06/15/2020   High risk medication use 11/20/2017   On prednisone  therapy 11/20/2017   Primary osteoarthritis of both knees 11/20/2017   Spondylosis of lumbar spine 11/20/2017   Ulcerative colitis (HCC) 11/20/2017   Abnormal laboratory test 11/20/2017   Polymyalgia rheumatica (HCC) 06/07/2017    Past Medical History:  Diagnosis Date   Leukopenia    Migraine    Osteoarthritis    Osteoporosis 02/12/2018   Polymyalgia rheumatica (HCC)    Ulcerative colitis (HCC)    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HCC)     Family History  Problem Relation Age of Onset   Heart disease Mother    Osteoporosis Father    Barrett's esophagus Father    Cancer Father    Heart disease Father    Dermatomyositis Sister    Dislocations Maternal Grandmother    Migraines Daughter    Bowel Disease Son    Colon cancer Neg Hx    Colon polyps Neg Hx    Esophageal cancer Neg Hx    Stomach cancer Neg Hx    Rectal cancer Neg Hx    Neuropathy Neg Hx  Past Surgical History:  Procedure Laterality Date   COLONOSCOPY     HAND SURGERY Left    2015 pt reports cutting her hand accidentally and needing exploratory surgery to check for nerve damage   KNEE SURGERY Right    Social History   Social History Narrative   Lives at home with husband   Right handed   Caffeine: 2 cups/day   Immunization History  Administered Date(s) Administered   Fluad Quad(high Dose 65+) 01/14/2019   Influenza, High Dose Seasonal PF 03/06/2018, 01/28/2020, 03/04/2021   PFIZER Comirnaty(Gray  Top)Covid-19 Tri-Sucrose Vaccine 09/30/2020   PFIZER(Purple Top)SARS-COV-2 Vaccination 06/04/2019, 06/25/2019, 02/05/2020   Pfizer Covid-19 Vaccine Bivalent Booster 53yrs & up 02/11/2021   Unspecified SARS-COV-2 Vaccination 02/06/2022     Objective: Vital Signs: BP 117/60 (BP Location: Left Arm, Patient Position: Sitting, Cuff Size: Normal)   Pulse 74   Ht 5' 8 (1.727 m)   Wt 144 lb 12.8 oz (65.7 kg)   BMI 22.02 kg/m    Physical Exam Vitals and nursing note reviewed.  Constitutional:      Appearance: She is well-developed.  HENT:     Head: Normocephalic and atraumatic.  Eyes:     Conjunctiva/sclera: Conjunctivae normal.  Cardiovascular:     Rate and Rhythm: Normal rate and regular rhythm.     Heart sounds: Normal heart sounds.  Pulmonary:     Effort: Pulmonary effort is normal.     Breath sounds: Normal breath sounds.  Abdominal:     General: Bowel sounds are normal.     Palpations: Abdomen is soft.  Musculoskeletal:     Cervical back: Normal range of motion.  Lymphadenopathy:     Cervical: No cervical adenopathy.  Skin:    General: Skin is warm and dry.     Capillary Refill: Capillary refill takes less than 2 seconds.  Neurological:     Mental Status: She is alert and oriented to person, place, and time.  Psychiatric:        Behavior: Behavior normal.      Musculoskeletal Exam: Cervical, thoracic and lumbar spine with good range of motion.  Shoulders, elbows, wrists, MCPs PIPs and DIPs with good range of motion with no synovitis.  Hip joints and knee joints were in good range of motion.  She had difficulty squatting and getting up from the squatting position.  There was no tenderness over her ankles or MTPs.  CDAI Exam: CDAI Score: -- Patient Global: --; Provider Global: -- Swollen: --; Tender: -- Joint Exam 11/20/2023   No joint exam has been documented for this visit   There is currently no information documented on the homunculus. Go to the Rheumatology  activity and complete the homunculus joint exam.  Investigation: No additional findings.  Imaging: No results found.  Recent Labs: Lab Results  Component Value Date   WBC 6.0 05/21/2023   HGB 13.3 05/21/2023   PLT 326 05/21/2023   NA 138 05/21/2023   K 4.1 05/21/2023   CL 103 05/21/2023   CO2 27 05/21/2023   GLUCOSE 113 (H) 05/21/2023   BUN 14 05/21/2023   CREATININE 0.88 05/21/2023   BILITOT 0.7 05/21/2023   ALKPHOS 84 08/26/2019   AST 22 05/21/2023   ALT 17 05/21/2023   PROT 6.9 05/21/2023   ALBUMIN 4.6 08/26/2019   CALCIUM 9.9 05/21/2023   GFRAA 79 09/29/2020   QFTBGOLDPLUS NEGATIVE 08/26/2019    Speciality Comments: No specialty comments available.  Procedures:  No procedures performed Allergies: Patient  has no known allergies.   Assessment / Plan:     Visit Diagnoses: Rheumatoid arthritis involving multiple sites with positive rheumatoid factor , +CCP Ab: Patient denies having a flare of rheumatoid arthritis for more than 1 year or longer.  She states that she ran out of leflunomide  about 2 weeks ago and has not noticed any worsening of symptoms.  She would like to discontinue leflunomide  and continue taking sulfasalazine .  She has been taking sulfasalazine  500 mg 2 tablets p.o. daily and leflunomide  10 mg p.o. daily.  She had no synovitis on the examination.  We discussed stopping leflunomide  and if her symptoms recur she should resume leflunomide .  High risk medication use - Arava  10 mg daily and sulfasalazine  500 mg 2 tablets p.o.daily (prescribed by Dr. Albertus). -CBC and CMP were normal on May 21, 2023.  She was advised to get labs today and then every 3 months to monitor for drug toxicity.  Plan: CBC with Differential/Platelet, Comprehensive metabolic panel with GFR.  Information reimmunization was placed in the AVS.  She was advised to stop leflunomide  if she develops an infection and resume if infection resolves if she resumes leflunomide .  Polymyalgia  rheumatica (HCC) - Off prednisone  since March 2022.  She had no recurrence of polymyalgia rheumatica symptoms.  No muscular weakness or tenderness was noted on the examination except for her lower extremities.  She has some generalized deconditioning in her lower extremities.  Other ulcerative colitis with other complication (HCC) - Followed by Dr. Albertus: Patient denies having a flare of ulcerative colitis.  Primary osteoarthritis of both knees-she denies any discomfort in her knee joints.  Both knee joints in good range of motion.  Lower extremity muscle strength exercises were demonstrated in the office today.  Spondylosis of lumbar spine-she continues to have some discomfort in the lower extremities.  She had been evaluated by neurosurgery and neurologist.  A handout on back exercises was given.  Poor balance-she is concerned about her poor balance.  I will refer her to physical therapy.  Small fiber neuropathy - Persistent-bilateral LE, left worse than right.  Evaluated by neurology on 12/13/2022.  MRI 01/05/2023--patient states that she was evaluated by neurosurgeon who advised her to follow-up with the neurologist.  Age-related osteoporosis without current pathological fracture - DEXA updated on 09/13/2022: Left femoral neck BMD 0.581 with T-score -2.4.  Taking vitamin D  1000 units daily.  Vitamin D  deficiency -I will recheck vitamin D  level today.  Plan: VITAMIN D  25 Hydroxy (Vit-D Deficiency, Fractures)  Dyslipidemia - Plan: Lipid panel  Orders: Orders Placed This Encounter  Procedures   CBC with Differential/Platelet   Comprehensive metabolic panel with GFR   Lipid panel   VITAMIN D  25 Hydroxy (Vit-D Deficiency, Fractures)   No orders of the defined types were placed in this encounter.    Follow-Up Instructions: Return in about 5 months (around 04/21/2024) for Rheumatoid arthritis, Osteoarthritis.   Maya Nash, MD  Note - This record has been created using [employer].  Chart creation errors have been sought, but may not always  have been located. Such creation errors do not reflect on  the standard of medical care.

## 2023-11-08 ENCOUNTER — Ambulatory Visit: Payer: Medicare HMO | Admitting: Rheumatology

## 2023-11-20 ENCOUNTER — Encounter: Payer: Self-pay | Admitting: Rheumatology

## 2023-11-20 ENCOUNTER — Ambulatory Visit: Payer: Medicare HMO | Attending: Rheumatology | Admitting: Rheumatology

## 2023-11-20 ENCOUNTER — Telehealth: Payer: Self-pay | Admitting: *Deleted

## 2023-11-20 VITALS — BP 117/60 | HR 74 | Ht 68.0 in | Wt 144.8 lb

## 2023-11-20 DIAGNOSIS — E559 Vitamin D deficiency, unspecified: Secondary | ICD-10-CM

## 2023-11-20 DIAGNOSIS — G629 Polyneuropathy, unspecified: Secondary | ICD-10-CM

## 2023-11-20 DIAGNOSIS — K51818 Other ulcerative colitis with other complication: Secondary | ICD-10-CM | POA: Diagnosis not present

## 2023-11-20 DIAGNOSIS — M353 Polymyalgia rheumatica: Secondary | ICD-10-CM

## 2023-11-20 DIAGNOSIS — M170 Bilateral primary osteoarthritis of knee: Secondary | ICD-10-CM

## 2023-11-20 DIAGNOSIS — E785 Hyperlipidemia, unspecified: Secondary | ICD-10-CM

## 2023-11-20 DIAGNOSIS — M810 Age-related osteoporosis without current pathological fracture: Secondary | ICD-10-CM | POA: Diagnosis not present

## 2023-11-20 DIAGNOSIS — Z79899 Other long term (current) drug therapy: Secondary | ICD-10-CM

## 2023-11-20 DIAGNOSIS — R2689 Other abnormalities of gait and mobility: Secondary | ICD-10-CM | POA: Diagnosis not present

## 2023-11-20 DIAGNOSIS — M47816 Spondylosis without myelopathy or radiculopathy, lumbar region: Secondary | ICD-10-CM | POA: Diagnosis not present

## 2023-11-20 DIAGNOSIS — Z7952 Long term (current) use of systemic steroids: Secondary | ICD-10-CM

## 2023-11-20 DIAGNOSIS — M0579 Rheumatoid arthritis with rheumatoid factor of multiple sites without organ or systems involvement: Secondary | ICD-10-CM

## 2023-11-20 NOTE — Patient Instructions (Signed)
 Standing Labs We placed an order today for your standing lab work.   Please have your standing labs drawn in October and every 3 months  Please have your labs drawn 2 weeks prior to your appointment so that the provider can discuss your lab results at your appointment, if possible.  Please note that you may see your imaging and lab results in MyChart before we have reviewed them. We will contact you once all results are reviewed. Please allow our office up to 72 hours to thoroughly review all of the results before contacting the office for clarification of your results.  WALK-IN LAB HOURS  Monday through Thursday from 8:00 am -12:30 pm and 1:00 pm-4:30 pm and Friday from 8:00 am-12:00 pm.  Patients with office visits requiring labs will be seen before walk-in labs.  You may encounter longer than normal wait times. Please allow additional time. Wait times may be shorter on  Monday and Thursday afternoons.  We do not book appointments for walk-in labs. We appreciate your patience and understanding with our staff.   Labs are drawn by [facility]. Please bring your co-pay at the time of your lab draw.  You may receive a bill from [facility] for your lab work.  Please note if you are on Hydroxychloroquine and and an order has been placed for a Hydroxychloroquine level,  you will need to have it drawn 4 hours or more after your last dose.  If you wish to have your labs drawn at another location, please call the office 24 hours in advance so we can fax the orders.  The office is located at [address], [place], [place] [address]   If you have any questions regarding directions or hours of operation,  please call [phone].   As a reminder, please drink plenty of water prior to coming for your lab work. Thanks!   Vaccines You are taking a medication(s) that can suppress your immune system.  The following immunizations are recommended: Flu annually Covid-19  Td/Tdap (tetanus,  diphtheria, pertussis) every 10 years Pneumonia (Prevnar 15 then Pneumovax 23 at least 1 year apart.  Alternatively, can take Prevnar 20 without needing additional dose) Shingrix: 2 doses from 4 weeks to 6 months apart  Please check with your PCP to make sure you are up to date. If you have signs or symptoms of an infection or start antibiotics: First, call your PCP for workup of your infection. Hold your medication through the infection, until you complete your antibiotics, and until symptoms resolve if you take the following: Injectable medication (Actemra, Benlysta, Cimzia, Cosentyx, Enbrel, Humira, Kevzara, Orencia, Remicade, Simponi, Stelara, Taltz, Tremfya) Methotrexate Leflunomide  (Arava ) Mycophenolate (Cellcept) Xeljanz, Olumiant, or Rinvoq  Exercises for Chronic Knee Pain Chronic knee pain is pain that lasts longer than 3 months. For most people with chronic knee pain, exercise and weight loss is an important part of treatment. Your health care provider may want you to focus on: Making the muscles that support your knee stronger. This can take pressure off your knee and reduce pain. Preventing knee stiffness. How far you can move your knee, keeping it there or making it farther. Losing weight (if this applies) to take pressure off your knee, lower your risk for injury, and make it easier for you to exercise. Your provider will help you make an exercise program that fits your needs and physical abilities. Below are simple, low-impact exercises you can do at home. Ask your provider or physical therapist how  often you should do your exercise program and how many times to repeat each exercise. General safety tips  Get your provider's approval before doing any exercises. Start slowly and stop any time you feel pain. Do not exercise if your knee pain is flaring up. Warm up first. Stretching a cold muscle can cause an injury. Do 5-10 minutes of easy movement or light stretching before  beginning your exercises. Do 5-10 minutes of low-impact activity (like walking or cycling) before starting strengthening exercises. Contact your provider any time you have pain during or after exercising. Exercise can cause discomfort but should not be painful. It is normal to be a little stiff or sore after exercising. Stretching and range-of-motion exercises Front thigh stretch  Stand up straight and support your body by holding on to a chair or resting one hand on a wall. With your legs straight and close together, bend one knee to lift your heel up toward your butt. Using one hand for support, grab your ankle with your free hand. Pull your foot up closer toward your butt to feel the stretch in front of your thigh. Hold the stretch for 30 seconds. Repeat __________ times. Complete this exercise __________ times a day. Back thigh stretch  Sit on the floor with your back straight and your legs out straight in front of you. Place the palms of your hands on the floor and slide them toward your feet as you bend at the hip. Try to touch your nose to your knees and feel the stretch in the back of your thighs. Hold for 30 seconds. Repeat __________ times. Complete this exercise __________ times a day. Calf stretch  Stand facing a wall. Place the palms of your hands flat against the wall, arms extended, and lean slightly against the wall. Get into a lunge position with one leg bent at the knee and the other leg stretched out straight behind you. Keep both feet facing the wall and increase the bend in your knee while keeping the heel of the other leg flat on the ground. You should feel the stretch in your calf. Hold for 30 seconds. Repeat __________ times. Complete this exercise __________ times a day. Strengthening exercises Straight leg lift  Lie on your back with one knee bent and the other leg out straight. Slowly lift the straight leg without bending the knee. Lift until your foot is  about 12 inches (30 cm) off the floor. Hold for 3-5 seconds and slowly lower your leg. Repeat __________ times. Complete this exercise __________ times a day. Single leg dip  Stand between two chairs and put both hands on the backs of the chairs for support. Extend one leg out straight with your body weight resting on the heel of the standing leg. Slowly bend your standing knee to dip your body to the level that is comfortable for you. Hold for 3-5 seconds. Repeat __________ times. Complete this exercise __________ times a day. Hamstring curls  Stand straight, knees close together, facing the back of a chair. Hold on to the back of a chair with both hands. Keep one leg straight. Bend the other knee while bringing the heel up toward the butt until the knee is bent at a 90-degree angle (right angle). Hold for 3-5 seconds. Repeat __________ times. Complete this exercise __________ times a day. Wall squat  Stand straight with your back, hips, and head against a wall. Step forward one foot at a time with your back still against the wall.  Your feet should be 2 feet (61 cm) from the wall at shoulder width. Keeping your back, hips, and head against the wall, slide down the wall to as close to a sitting position as you can get. Hold for 5-10 seconds, then slowly slide back up. Repeat __________ times. Complete this exercise __________ times a day. Step-ups  Stand in front of a sturdy platform or stool that is about 6 inches (15 cm) high. Slowly step up with your left / right foot, keeping your knee in line with your hip and foot. Do not let your knee bend so far that you cannot see your toes. Hold on to a chair for balance, but do not use it for support. Slowly unlock your knee and lower yourself to the starting position. Repeat __________ times. Complete this exercise __________ times a day. Contact a health care provider if: Your exercises cause pain. Your pain is worse after you  exercise. Your pain prevents you from doing your exercises. This information is not intended to replace advice given to you by your health care provider. Make sure you discuss any questions you have with your health care provider. Document Revised: 05/16/2022 Document Reviewed: 05/16/2022 Elsevier Patient Education  2024 [employer] Low Back Sprain or Strain Rehab Ask your health care provider which exercises are safe for you. Do exercises exactly as told by your health care provider and adjust them as directed. It is normal to feel mild stretching, pulling, tightness, or discomfort as you do these exercises. Stop right away if you feel sudden pain or your pain gets worse. Do not begin these exercises until told by your health care provider. Stretching and range-of-motion exercises These exercises warm up your muscles and joints and improve the movement and flexibility of your back. These exercises also help to relieve pain, numbness, and tingling. Lumbar rotation  Lie on your back on a firm bed or the floor with your knees bent. Straighten your arms out to your sides so each arm forms a 90-degree angle (right angle) with a side of your body. Slowly move (rotate) both of your knees to one side of your body until you feel a stretch in your lower back (lumbar). Try not to let your shoulders lift off the floor. Hold this position for __________ seconds. Tense your abdominal muscles and slowly move your knees back to the starting position. Repeat this exercise on the other side of your body. Repeat __________ times. Complete this exercise __________ times a day. Single knee to chest  Lie on your back on a firm bed or the floor with both legs straight. Bend one of your knees. Use your hands to move your knee up toward your chest until you feel a gentle stretch in your lower back and buttock. Hold your leg in this position by holding on to the front of your knee. Keep your other leg as straight  as possible. Hold this position for __________ seconds. Slowly return to the starting position. Repeat with your other leg. Repeat __________ times. Complete this exercise __________ times a day. Prone extension on elbows  Lie on your abdomen on a firm bed or the floor (prone position). Prop yourself up on your elbows. Use your arms to help lift your chest up until you feel a gentle stretch in your abdomen and your lower back. This will place some of your body weight on your elbows. If this is uncomfortable, try stacking pillows under your chest. Your hips should stay down,  against the surface that you are lying on. Keep your hip and back muscles relaxed. Hold this position for __________ seconds. Slowly relax your upper body and return to the starting position. Repeat __________ times. Complete this exercise __________ times a day. Strengthening exercises These exercises build strength and endurance in your back. Endurance is the ability to use your muscles for a long time, even after they get tired. Pelvic tilt This exercise strengthens the muscles that lie deep in the abdomen. Lie on your back on a firm bed or the floor with your legs extended. Bend your knees so they are pointing toward the ceiling and your feet are flat on the floor. Tighten your lower abdominal muscles to press your lower back against the floor. This motion will tilt your pelvis so your tailbone points up toward the ceiling instead of pointing to your feet or the floor. To help with this exercise, you may place a small towel under your lower back and try to push your back into the towel. Hold this position for __________ seconds. Let your muscles relax completely before you repeat this exercise. Repeat __________ times. Complete this exercise __________ times a day. Alternating arm and leg raises  Get on your hands and knees on a firm surface. If you are on a hard floor, you may want to use padding, such as an  exercise mat, to cushion your knees. Line up your arms and legs. Your hands should be directly below your shoulders, and your knees should be directly below your hips. Lift your left leg behind you. At the same time, raise your right arm and straighten it in front of you. Do not lift your leg higher than your hip. Do not lift your arm higher than your shoulder. Keep your abdominal and back muscles tight. Keep your hips facing the ground. Do not arch your back. Keep your balance carefully, and do not hold your breath. Hold this position for __________ seconds. Slowly return to the starting position. Repeat with your right leg and your left arm. Repeat __________ times. Complete this exercise __________ times a day. Abdominal set with straight leg raise  Lie on your back on a firm bed or the floor. Bend one of your knees and keep your other leg straight. Tense your abdominal muscles and lift your straight leg up, 4-6 inches (10-15 cm) off the ground. Keep your abdominal muscles tight and hold this position for __________ seconds. Do not hold your breath. Do not arch your back. Keep it flat against the ground. Keep your abdominal muscles tense as you slowly lower your leg back to the starting position. Repeat with your other leg. Repeat __________ times. Complete this exercise __________ times a day. Single leg lower with bent knees Lie on your back on a firm bed or the floor. Tense your abdominal muscles and lift your feet off the floor, one foot at a time, so your knees and hips are bent in 90-degree angles (right angles). Your knees should be over your hips and your lower legs should be parallel to the floor. Keeping your abdominal muscles tense and your knee bent, slowly lower one of your legs so your toe touches the ground. Lift your leg back up to return to the starting position. Do not hold your breath. Do not let your back arch. Keep your back flat against the ground. Repeat with  your other leg. Repeat __________ times. Complete this exercise __________ times a day. Posture and body mechanics Good  posture and healthy body mechanics can help to relieve stress in your body's tissues and joints. Body mechanics refers to the movements and positions of your body while you do your daily activities. Posture is part of body mechanics. Good posture means: Your spine is in its natural S-curve position (neutral). Your shoulders are pulled back slightly. Your head is not tipped forward (neutral). Follow these guidelines to improve your posture and body mechanics in your everyday activities. Standing  When standing, keep your spine neutral and your feet about hip-width apart. Keep a slight bend in your knees. Your ears, shoulders, and hips should line up. When you do a task in which you stand in one place for a long time, place one foot up on a stable object that is 2-4 inches (5-10 cm) high, such as a footstool. This helps keep your spine neutral. Sitting  When sitting, keep your spine neutral and keep your feet flat on the floor. Use a footrest, if necessary, and keep your thighs parallel to the floor. Avoid rounding your shoulders, and avoid tilting your head forward. When working at a desk or a computer, keep your desk at a height where your hands are slightly lower than your elbows. Slide your chair under your desk so you are close enough to maintain good posture. When working at a computer, place your monitor at a height where you are looking straight ahead and you do not have to tilt your head forward or downward to look at the screen. Resting When lying down and resting, avoid positions that are most painful for you. If you have pain with activities such as sitting, bending, stooping, or squatting, lie in a position in which your body does not bend very much. For example, avoid curling up on your side with your arms and knees near your chest (fetal position). If you have pain  with activities such as standing for a long time or reaching with your arms, lie with your spine in a neutral position and bend your knees slightly. Try the following positions: Lying on your side with a pillow between your knees. Lying on your back with a pillow under your knees. Lifting  When lifting objects, keep your feet at least shoulder-width apart and tighten your abdominal muscles. Bend your knees and hips and keep your spine neutral. It is important to lift using the strength of your legs, not your back. Do not lock your knees straight out. Always ask for help to lift heavy or awkward objects. This information is not intended to replace advice given to you by your health care provider. Make sure you discuss any questions you have with your health care provider. Document Revised: 09/04/2022 Document Reviewed: 07/19/2020 Elsevier Patient Education  2024 [employer].

## 2023-11-20 NOTE — Telephone Encounter (Signed)
Referral placed per Dr. Estanislado Pandy

## 2023-11-21 ENCOUNTER — Ambulatory Visit: Payer: Self-pay | Admitting: Rheumatology

## 2023-11-21 LAB — LIPID PANEL
Cholesterol: 226 mg/dL — ABNORMAL HIGH (ref <200)
HDL: 100 mg/dL (ref >=50)
LDL Cholesterol (Calc): 107 mg/dL — ABNORMAL HIGH
Non-HDL Cholesterol (Calc): 126 mg/dL (ref <130)
Total CHOL/HDL Ratio: 2.3 (calc) (ref <5.0)
Triglycerides: 101 mg/dL (ref <150)

## 2023-11-21 LAB — COMPREHENSIVE METABOLIC PANEL WITH GFR
AG Ratio: 2 (calc) (ref 1.0–2.5)
ALT: 19 U/L (ref 6–29)
AST: 25 U/L (ref 10–35)
Albumin: 4.7 g/dL (ref 3.6–5.1)
Alkaline phosphatase (APISO): 55 U/L (ref 37–153)
BUN: 16 mg/dL (ref 7–25)
CO2: 28 mmol/L (ref 20–32)
Calcium: 10.3 mg/dL (ref 8.6–10.4)
Chloride: 102 mmol/L (ref 98–110)
Creat: 0.87 mg/dL (ref 0.60–1.00)
Globulin: 2.3 g/dL (ref 1.9–3.7)
Glucose, Bld: 103 mg/dL — ABNORMAL HIGH (ref 65–99)
Potassium: 4.5 mmol/L (ref 3.5–5.3)
Sodium: 138 mmol/L (ref 135–146)
Total Bilirubin: 0.8 mg/dL (ref 0.2–1.2)
Total Protein: 7 g/dL (ref 6.1–8.1)
eGFR: 72 mL/min/1.73m2 (ref >=60)

## 2023-11-21 LAB — CBC WITH DIFFERENTIAL/PLATELET
Absolute Lymphocytes: 1835 {cells}/uL (ref 850–3900)
Absolute Monocytes: 694 {cells}/uL (ref 200–950)
Basophils Absolute: 50 {cells}/uL (ref 0–200)
Basophils Relative: 0.8 %
Eosinophils Absolute: 459 {cells}/uL (ref 15–500)
Eosinophils Relative: 7.4 %
HCT: 41.2 % (ref 35.0–45.0)
Hemoglobin: 13.2 g/dL (ref 11.7–15.5)
MCH: 31.2 pg (ref 27.0–33.0)
MCHC: 32 g/dL (ref 32.0–36.0)
MCV: 97.4 fL (ref 80.0–100.0)
MPV: 11 fL (ref 7.5–12.5)
Monocytes Relative: 11.2 %
Neutro Abs: 3162 {cells}/uL (ref 1500–7800)
Neutrophils Relative %: 51 %
Platelets: 287 Thousand/uL (ref 140–400)
RBC: 4.23 Million/uL (ref 3.80–5.10)
RDW: 11.7 % (ref 11.0–15.0)
Total Lymphocyte: 29.6 %
WBC: 6.2 Thousand/uL (ref 3.8–10.8)

## 2023-11-21 LAB — VITAMIN D 25 HYDROXY (VIT D DEFICIENCY, FRACTURES): Vit D, 25-Hydroxy: 48 ng/mL (ref 30–100)

## 2023-11-21 NOTE — Progress Notes (Signed)
 CBC and CMP normal, vitamin D  48 in the desirable range.  LDL is mildly elevated at 892.  Please forward results to her PCP.

## 2023-11-23 ENCOUNTER — Other Ambulatory Visit: Payer: Self-pay | Admitting: Rheumatology

## 2023-11-23 NOTE — Telephone Encounter (Signed)
 Last Fill: 05/28/2023  Labs: 11/20/2023 CBC and CMP normal   Next Visit: 05/01/2024  Last Visit: 11/20/2023  DX: Rheumatoid arthritis involving multiple sites with positive rheumatoid factor   Current Dose per office note 11/20/2023: Arava  10 mg daily   Okay to refill Arava  ?

## 2023-12-18 ENCOUNTER — Other Ambulatory Visit: Payer: Self-pay

## 2023-12-18 ENCOUNTER — Ambulatory Visit: Attending: Rheumatology

## 2023-12-18 DIAGNOSIS — R2689 Other abnormalities of gait and mobility: Secondary | ICD-10-CM | POA: Diagnosis not present

## 2023-12-18 DIAGNOSIS — R262 Difficulty in walking, not elsewhere classified: Secondary | ICD-10-CM | POA: Insufficient documentation

## 2023-12-18 DIAGNOSIS — R293 Abnormal posture: Secondary | ICD-10-CM | POA: Insufficient documentation

## 2023-12-18 DIAGNOSIS — M6281 Muscle weakness (generalized): Secondary | ICD-10-CM | POA: Diagnosis not present

## 2023-12-18 DIAGNOSIS — R252 Cramp and spasm: Secondary | ICD-10-CM | POA: Diagnosis not present

## 2023-12-18 NOTE — Therapy (Signed)
 OUTPATIENT PHYSICAL THERAPY LOWER EXTREMITY EVALUATION   Patient Name: Terri Hudson MRN: [id number] DOB:1950-06-09, 73 y.o., female Today's Date: 12/18/2023  END OF SESSION:  PT End of Session - 12/18/23 1547     Visit Number 1    Date for PT Re-Evaluation 02/12/24    Authorization Type [employer]    Progress Note Due on Visit 10    PT Start Time 1536    PT Stop Time 1618    PT Time Calculation (min) 42 min    Activity Tolerance Patient tolerated treatment well    Behavior During Therapy [facility] for tasks assessed/performed          Past Medical History:  Diagnosis Date   Leukopenia    Migraine    Osteoarthritis    Osteoporosis 02/12/2018   Polymyalgia rheumatica (HCC)    Ulcerative colitis (HCC)    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HCC)    Past Surgical History:  Procedure Laterality Date   COLONOSCOPY     HAND SURGERY Left    2015 pt reports cutting her hand accidentally and needing exploratory surgery to check for nerve damage   KNEE SURGERY Right    Patient Active Problem List   Diagnosis Date Noted   Small fiber neuropathy 06/20/2021   Family history of amyloidosis 06/20/2021   Numbness and tingling of foot 07/28/2020   Rheumatoid arthritis involving multiple sites with positive rheumatoid factor (HCC) 06/15/2020   High risk medication use 11/20/2017   On prednisone  therapy 11/20/2017   Primary osteoarthritis of both knees 11/20/2017   Spondylosis of lumbar spine 11/20/2017   Ulcerative colitis (HCC) 11/20/2017   Abnormal laboratory test 11/20/2017   Polymyalgia rheumatica (HCC) 06/07/2017    PCP: Dr. Chrystal  REFERRING PROVIDER: Dolphus Reiter, MD  REFERRING DIAG: R26.89 (ICD-10-CM) - Poor balance  THERAPY DIAG:  Difficulty in walking, not elsewhere classified - Plan: PT plan of care cert/re-cert  Muscle weakness (generalized) - Plan: PT plan of care cert/re-cert  Cramp and spasm - Plan: PT plan of care cert/re-cert  Abnormal posture  - Plan: PT plan of care cert/re-cert  Rationale for Evaluation and Treatment: Rehabilitation  ONSET DATE: 11/20/2023  SUBJECTIVE:   SUBJECTIVE STATEMENT: Patient states she has history of leg weakness and provider noticed uncontrolled decent upon last appt which was concerning. Patient states she has not fallen but feels unsteady at times.  She states she works out regularly at her fitness facility.  She is able to do 85% of her usual functional acitivity.  Her primary issue is with sit to stand and controlled decent.  She would like to gain understanding of where her weak areas are, be educated on proper exercises for her weak areas and be able to do sit to stand with ease.    PERTINENT HISTORY: PMR history PAIN:  Are you having pain? Yes: NPRS scale: 0/10 currently, night time occasionally 3/10 Pain location: legs and feet Pain description: aching, like a tight stocking is on them Aggravating factors: trying to sleep Relieving factors: movement  PRECAUTIONS: Fall  RED FLAGS: None   WEIGHT BEARING RESTRICTIONS: No  FALLS:  Has patient fallen in last 6 months? No  LIVING ENVIRONMENT: Lives with: lives with their spouse Lives in: House/apartment Stairs: Yes: Internal: 14 steps; on right going up and External: 3 steps; none Has following equipment at home: None  OCCUPATION: Civil engineer, contracting: Chief Financial Officer, sitting for long periods of time  PLOF: Independent  PATIENT GOALS: to be able to sit without  plopping   NEXT MD VISIT: October  OBJECTIVE:  Note: Objective measures were completed at Evaluation unless otherwise noted.  DIAGNOSTIC FINDINGS: na  PATIENT SURVEYS:  LEFS  Extreme difficulty/unable (0), Quite a bit of difficulty (1), Moderate difficulty (2), Little difficulty (3), No difficulty (4) Survey date:    Any of your usual work, housework or school activities 4  2. Usual hobbies, recreational or sporting activities 4  3. Getting into/out of the bath 4   4. Walking between rooms 4  5. Putting on socks/shoes 4  6. Squatting  0  7. Lifting an object, like a bag of groceries from the floor 2  8. Performing light activities around your home 4  9. Performing heavy activities around your home 2  10. Getting into/out of a car 3  11. Walking 2 blocks 4  12. Walking 1 mile 4  13. Going up/down 10 stairs (1 flight) 4  14. Standing for 1 hour 3  15.  sitting for 1 hour 4  16. Running on even ground 3  17. Running on uneven ground 2  18. Making sharp turns while running fast 0  19. Hopping  2  20. Rolling over in bed 4  Score total:  61     COGNITION: Overall cognitive status: Within functional limits for tasks assessed     SENSATION: WFL Patient has diagnosed neuropathy per her report  POSTURE: rounded shoulders, forward head, and increased thoracic kyphosis  PALPATION: na  LOWER EXTREMITY ROM:  WFL  LOWER EXTREMITY MMT:  MMT Right eval Left eval  Hip flexion 4 4  Hip extension 4- 4-  Hip abduction 4 4  Hip adduction 4+ 4+  Hip internal rotation 4- 4-  Hip external rotation 4- 4-  Knee flexion 4+ 4+  Knee extension 4+ 4+  Ankle dorsiflexion 4+ 4+  Ankle plantarflexion    Ankle inversion    Ankle eversion     (Blank rows = not tested)   FUNCTIONAL TESTS:  5 times sit to stand: complete next visit Timed up and go (TUG): complete next visit  GAIT: Distance walked: 30 feet Assistive device utilized: None Level of assistance: Modified independence Comments: short step length                                                                                                                                TREATMENT DATE: 12/18/23 Initial eval completed and initiated HEP  PATIENT EDUCATION:  Education details: Initiated HEP, educated on various contributors to balance issues, various treatments for neuropathy, safety considerations and fall prevention. Person educated: Patient Education method: Explanation,  Demonstration, Verbal cues, and Handouts Education comprehension: verbalized understanding, returned demonstration, and verbal cues required  HOME EXERCISE PROGRAM: Access Code: [email or web address] URL: [email or web address] Date: 12/18/2023 Prepared by: Delon Haddock  Exercises - Seated Hip Abduction with Resistance  - 1 x daily - 7 x weekly - 3 sets - 10 reps -  Hooklying Isometric Clamshell  - 1 x daily - 7 x weekly - 3 sets - 10 reps - Clamshell with Resistance  - 1 x daily - 7 x weekly - 3 sets - 10 reps - Sidelying Diagonal Hip Abduction  - 1 x daily - 7 x weekly - 3 sets - 10 reps - Side Stepping with Resistance at Ankles  - 1 x daily - 7 x weekly - 3 sets - 10 reps  ASSESSMENT:  CLINICAL IMPRESSION: Patient is a 73 y.o. female who was seen today for physical therapy evaluation and treatment for unsteady gait and fall risk.  She presents with proximal hip and core weakness along with unsteady gait.  She exercises on a regular basis at her local fitness facility.  She also has reported neuropathy.  She would benefit from skilled PT for core and LE strengthening, functional training and strengthening and fall prevention.    OBJECTIVE IMPAIRMENTS: decreased balance, decreased knowledge of condition, difficulty walking, decreased ROM, decreased strength, increased fascial restrictions, impaired flexibility, improper body mechanics, postural dysfunction, and pain.   ACTIVITY LIMITATIONS: lifting, bending, standing, squatting, stairs, transfers, toileting, and caring for others  PARTICIPATION LIMITATIONS: meal prep, cleaning, laundry, driving, shopping, community activity, and yard work  PERSONAL FACTORS: Fitness and Past/current experiences are also affecting patient's functional outcome.   REHAB POTENTIAL: Good  CLINICAL DECISION MAKING: Stable/uncomplicated  EVALUATION COMPLEXITY: Low   GOALS: Goals reviewed with patient? Yes  SHORT TERM GOALS: Target date:  01/15/2024  Patient will be independent with initial HEP  Baseline: Goal status: INITIAL  2.  Pain report to be no greater than 4/10  Baseline:  Goal status: INITIAL   LONG TERM GOALS: Target date: 02/12/2024  Patient to report pain no greater than 2/10  Baseline:  Goal status: INITIAL  2.  Patient to be independent with advanced HEP  Baseline:  Goal status: INITIAL  3.  Patient to be able to bend, stoop and squat with controlled decent safely Baseline:  Goal status: INITIAL  4.  No falls during episode of PT Baseline:  Goal status: INITIAL  5.  Patient to demonstrate upright posture for 100 feet without vc's Baseline:  Goal status: INITIAL  6.  Patient to demonstrate proper alignment with 5 times sit to stand under 20 sec Baseline:  Goal status: INITIAL   PLAN:  PT FREQUENCY: 1x/week  PT DURATION: 8 weeks  PLANNED INTERVENTIONS: 97110-Therapeutic exercises, 97530- Therapeutic activity, 97112- Neuromuscular re-education, 97535- Self Care, 02859- Manual therapy, [phone]- Gait training, [phone]- Aquatic Therapy, Patient/Family education, Balance training, Stair training, Taping, Joint mobilization, Vestibular training, Visual/preceptual remediation/compensation, DME instructions, and Cryotherapy  PLAN FOR NEXT SESSION:  5 times sit to stand and TUG,Nustep, review HEP, begin functional training.    Delon B. Laith Antonelli, PT 12/18/23 10:27 PM [facility] [address] [place], [place] [address] Phone # [phone] Fax [phone]

## 2024-01-09 ENCOUNTER — Ambulatory Visit

## 2024-01-09 DIAGNOSIS — R252 Cramp and spasm: Secondary | ICD-10-CM

## 2024-01-09 DIAGNOSIS — R2689 Other abnormalities of gait and mobility: Secondary | ICD-10-CM | POA: Diagnosis not present

## 2024-01-09 DIAGNOSIS — R293 Abnormal posture: Secondary | ICD-10-CM | POA: Diagnosis not present

## 2024-01-09 DIAGNOSIS — M6281 Muscle weakness (generalized): Secondary | ICD-10-CM

## 2024-01-09 DIAGNOSIS — R262 Difficulty in walking, not elsewhere classified: Secondary | ICD-10-CM | POA: Diagnosis not present

## 2024-01-09 NOTE — Therapy (Unsigned)
 OUTPATIENT PHYSICAL THERAPY LOWER EXTREMITY TREATMENT NOTE   Patient Name: Terri Hudson MRN: [id number] DOB:November 25, 1950, 73 y.o., female Today's Date: 01/10/2024  END OF SESSION:  PT End of Session - 01/09/24 1402     Visit Number 2    Date for PT Re-Evaluation 02/12/24    Authorization Type [employer] Medicare    PT Start Time 1403    PT Stop Time 1446    PT Time Calculation (min) 43 min    Activity Tolerance Patient tolerated treatment well    Behavior During Therapy [facility] for tasks assessed/performed          Past Medical History:  Diagnosis Date   Leukopenia    Migraine    Osteoarthritis    Osteoporosis 02/12/2018   Polymyalgia rheumatica (HCC)    Ulcerative colitis (HCC)    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HCC)    Past Surgical History:  Procedure Laterality Date   COLONOSCOPY     HAND SURGERY Left    2015 pt reports cutting her hand accidentally and needing exploratory surgery to check for nerve damage   KNEE SURGERY Right    Patient Active Problem List   Diagnosis Date Noted   Small fiber neuropathy 06/20/2021   Family history of amyloidosis 06/20/2021   Numbness and tingling of foot 07/28/2020   Rheumatoid arthritis involving multiple sites with positive rheumatoid factor (HCC) 06/15/2020   High risk medication use 11/20/2017   On prednisone  therapy 11/20/2017   Primary osteoarthritis of both knees 11/20/2017   Spondylosis of lumbar spine 11/20/2017   Ulcerative colitis (HCC) 11/20/2017   Abnormal laboratory test 11/20/2017   Polymyalgia rheumatica (HCC) 06/07/2017    PCP: Dr. Chrystal  REFERRING PROVIDER: Dolphus Reiter, MD  REFERRING DIAG: R26.89 (ICD-10-CM) - Poor balance  THERAPY DIAG:  Difficulty in walking, not elsewhere classified  Muscle weakness (generalized)  Abnormal posture  Cramp and spasm  Rationale for Evaluation and Treatment: Rehabilitation  ONSET DATE: 11/20/2023  SUBJECTIVE:   SUBJECTIVE STATEMENT: Patient  reports no falls since last visit.  Feeling pretty good.  No pain today.  PERTINENT HISTORY: PMR history PAIN:  01/09/24 Are you having pain? Yes: NPRS scale: 0/10 Pain location: legs and feet Pain description: aching, like a tight stocking is on them Aggravating factors: trying to sleep Relieving factors: movement  PRECAUTIONS: Fall  RED FLAGS: None   WEIGHT BEARING RESTRICTIONS: No  FALLS:  Has patient fallen in last 6 months? No  LIVING ENVIRONMENT: Lives with: lives with their spouse Lives in: House/apartment Stairs: Yes: Internal: 14 steps; on right going up and External: 3 steps; none Has following equipment at home: None  OCCUPATION: Civil engineer, contracting: Chief Financial Officer, sitting for long periods of time  PLOF: Independent  PATIENT GOALS: to be able to sit without plopping   NEXT MD VISIT: October  OBJECTIVE:  Note: Objective measures were completed at Evaluation unless otherwise noted.  DIAGNOSTIC FINDINGS: na  PATIENT SURVEYS:  LEFS  Extreme difficulty/unable (0), Quite a bit of difficulty (1), Moderate difficulty (2), Little difficulty (3), No difficulty (4) Survey date:    Any of your usual work, housework or school activities 4  2. Usual hobbies, recreational or sporting activities 4  3. Getting into/out of the bath 4  4. Walking between rooms 4  5. Putting on socks/shoes 4  6. Squatting  0  7. Lifting an object, like a bag of groceries from the floor 2  8. Performing light activities around your home 4  9. Performing heavy  activities around your home 2  10. Getting into/out of a car 3  11. Walking 2 blocks 4  12. Walking 1 mile 4  13. Going up/down 10 stairs (1 flight) 4  14. Standing for 1 hour 3  15.  sitting for 1 hour 4  16. Running on even ground 3  17. Running on uneven ground 2  18. Making sharp turns while running fast 0  19. Hopping  2  20. Rolling over in bed 4  Score total:  61     COGNITION: Overall cognitive status: Within  functional limits for tasks assessed     SENSATION: WFL Patient has diagnosed neuropathy per her report  POSTURE: rounded shoulders, forward head, and increased thoracic kyphosis  PALPATION: na  LOWER EXTREMITY ROM:  WFL  LOWER EXTREMITY MMT:  MMT Right eval Left eval  Hip flexion 4 4  Hip extension 4- 4-  Hip abduction 4 4  Hip adduction 4+ 4+  Hip internal rotation 4- 4-  Hip external rotation 4- 4-  Knee flexion 4+ 4+  Knee extension 4+ 4+  Ankle dorsiflexion 4+ 4+  Ankle plantarflexion    Ankle inversion    Ankle eversion     (Blank rows = not tested)   FUNCTIONAL TESTS:  5 times sit to stand: 12.76 sec Timed up and go (TUG): 7.94 sec  GAIT: Distance walked: 30 feet Assistive device utilized: None Level of assistance: Modified independence Comments: short step length                                                                                                                                TREATMENT DATE:  01/09/24 Lateral band walks x 3 laps of 15 feet with yellow loop Standing hip ER/abd with yellow loop  2 x 10 Sit to stand in staggered stance x 10 each LE (vc's for avoiding knee valgus on eccentric lowering) Speed squats 2 x 30 sec Squat hold x 10 holding 10 sec, 2 sec rest at top Quad burners x 20 on mat table using dumbbells for wrist  Seated opposite elbow to knee 2 x 10 each side Cone touches 3 x 10 each LE Supine hamstring sliders in bridge position using sliders x 20 total alternating (10 ea leg)  12/18/23 Initial eval completed and initiated HEP   PATIENT EDUCATION:  Education details: Initiated HEP, educated on various contributors to balance issues, various treatments for neuropathy, safety considerations and fall prevention. Person educated: Patient Education method: Explanation, Demonstration, Verbal cues, and Handouts Education comprehension: verbalized understanding, returned demonstration, and verbal cues required  HOME  EXERCISE PROGRAM: Access Code: [email or web address] URL: [email or web address] Date: 12/18/2023 Prepared by: Delon Haddock  Exercises - Seated Hip Abduction with Resistance  - 1 x daily - 7 x weekly - 3 sets - 10 reps - Hooklying Isometric Clamshell  - 1 x daily - 7 x weekly - 3 sets - 10 reps - Clamshell  with Resistance  - 1 x daily - 7 x weekly - 3 sets - 10 reps - Sidelying Diagonal Hip Abduction  - 1 x daily - 7 x weekly - 3 sets - 10 reps - Side Stepping with Resistance at Ankles  - 1 x daily - 7 x weekly - 3 sets - 10 reps  ASSESSMENT:  CLINICAL IMPRESSION: Patient is quite fit but has isolated proximal hip weakness and lack of eccentric control in hips and quads.  She was able to complete all tasks today but fatigued easily.  The hamstring exercise was most challenging for her.  She is well motivated and compliant.  She should continue to do well.  She would benefit from continuing skilled PT for core and LE strengthening, functional training and strengthening and fall prevention.    OBJECTIVE IMPAIRMENTS: decreased balance, decreased knowledge of condition, difficulty walking, decreased ROM, decreased strength, increased fascial restrictions, impaired flexibility, improper body mechanics, postural dysfunction, and pain.   ACTIVITY LIMITATIONS: lifting, bending, standing, squatting, stairs, transfers, toileting, and caring for others  PARTICIPATION LIMITATIONS: meal prep, cleaning, laundry, driving, shopping, community activity, and yard work  PERSONAL FACTORS: Fitness and Past/current experiences are also affecting patient's functional outcome.   REHAB POTENTIAL: Good  CLINICAL DECISION MAKING: Stable/uncomplicated  EVALUATION COMPLEXITY: Low   GOALS: Goals reviewed with patient? Yes  SHORT TERM GOALS: Target date: 01/15/2024  Patient will be independent with initial HEP  Baseline: Goal status: In Progress  2.  Pain report to be no greater than 4/10  Baseline:   Goal status: MET 01/09/24   LONG TERM GOALS: Target date: 02/12/2024  Patient to report pain no greater than 2/10  Baseline:  Goal status: INITIAL  2.  Patient to be independent with advanced HEP  Baseline:  Goal status: INITIAL  3.  Patient to be able to bend, stoop and squat with controlled decent safely Baseline:  Goal status: INITIAL  4.  No falls during episode of PT Baseline:  Goal status: INITIAL  5.  Patient to demonstrate upright posture for 100 feet without vc's Baseline:  Goal status: INITIAL  6.  Patient to demonstrate proper alignment with 5 times sit to stand under 20 sec Baseline:  Goal status: INITIAL   PLAN:  PT FREQUENCY: 1x/week  PT DURATION: 8 weeks  PLANNED INTERVENTIONS: 97110-Therapeutic exercises, 97530- Therapeutic activity, 97112- Neuromuscular re-education, 97535- Self Care, 02859- Manual therapy, [phone]- Gait training, [phone]- Aquatic Therapy, Patient/Family education, Balance training, Stair training, Taping, Joint mobilization, Vestibular training, Visual/preceptual remediation/compensation, DME instructions, and Cryotherapy  PLAN FOR NEXT SESSION:  Functional training, proximal hip strengthening, eccentric control training for quads and hip, planks and side planks    Amaiah Cristiano B. Beadie Matsunaga, PT 01/10/24 9:23 AM [facility] [address] [place], [place] [address] Phone # [phone] Fax [phone]

## 2024-01-15 ENCOUNTER — Ambulatory Visit: Admitting: Podiatry

## 2024-01-15 VITALS — Ht 68.0 in | Wt 144.8 lb

## 2024-01-15 DIAGNOSIS — G603 Idiopathic progressive neuropathy: Secondary | ICD-10-CM

## 2024-01-15 NOTE — Patient Instructions (Addendum)
  VISIT SUMMARY: During your visit, we discussed the progression of your peripheral neuropathy symptoms, which have now affected both of your feet. You described the sensation as feeling like you are always wearing socks, with numbness but no pain. We reviewed your previous consultations with specialists and discussed potential next steps for managing your symptoms.  YOUR PLAN: -IDIOPATHIC PERIPHERAL NEUROPATHY: Peripheral neuropathy is a condition where the nerves outside of your brain and spinal cord are damaged, causing numbness, tingling, or pain. In your case, the cause is unknown (idiopathic) and primarily presents as numbness. We will check your B12 levels to rule out deficiency as a cause. You may consider acupuncture as a supportive therapy, and we can provide referrals if needed. We recommend taking alpha lipoic acid and Nervive supplements (which include B12 and B6) to support nerve health. Please perform daily foot checks and avoid going barefoot to prevent injuries. Monitor for any changes in your balance and walking, and avoid therapies like red light therapy that lack clinical evidence.  INSTRUCTIONS: Please have your B12 levels checked if they have not been done recently. Consider consulting with your primary care doctor, neurologist, or rheumatologist about acupuncture as a supportive therapy. Continue with the recommended supplements and daily foot checks. Monitor your symptoms closely, especially for any changes in balance and walking.                      Contains text generated by Abridge.                                 Contains text generated by Abridge.

## 2024-01-16 ENCOUNTER — Ambulatory Visit

## 2024-01-16 NOTE — Progress Notes (Signed)
  Subjective:  Patient ID: Terri Hudson, female    DOB: [birth year],  MRN: [id number]  Chief Complaint  Patient presents with   Peripheral Neuropathy    RM 1 Patient is here for eval for peripheral neuropathy. Patient is seeking more information about treatment options.     Discussed the use of AI scribe software for clinical note transcription with the patient, who gave verbal consent to proceed.  History of Present Illness Terri Hudson is a 73 year old female who presents with progression of peripheral neuropathy symptoms.  She has been experiencing ongoing issues with peripheral neuropathy, which initially started in her left foot and has now progressed to both feet. She describes the sensation as 'feeling like I'm always wearing socks' and notes that it is mostly numbness without pain.  She has previously consulted with a rheumatologist and a neurologist, who conducted tests. Despite these consultations, she has not found relief from the numbness, and treatments typically aimed at painful neuropathy have not been effective for her symptoms.  She is concerned about the progression of her symptoms, particularly the potential impact on her walking and balance. She has not had her B12 levels checked recently, although they were checked in the past.  She reports no history of diabetes, pain, burning, or tingling sensations. She is not currently seeing a neurologist.      Objective:    Physical Exam VASCULAR: DP and PT pulse palpable. Foot is warm and well-perfused. Capillary fill time is brisk. DERMATOLOGIC: Normal skin turgor, texture, and temperature. No open lesions, rashes, or ulcerations. NEUROLOGIC: Decreased sensation in distal foot and toes. No paresthesias on examination. ORTHOPEDIC: Smooth, pain-free range of motion of all examined joints. No ecchymosis or bruising. No gross deformity. No pain to palpation.   No images are attached to the encounter.     Results     Assessment:   1. Idiopathic progressive neuropathy      Plan:  Patient was evaluated and treated and all questions answered.  Assessment and Plan Assessment & Plan Idiopathic peripheral neuropathy Progressive peripheral neuropathy affecting both feet, initially starting in the left foot, presenting primarily as numbness without pain, described as feeling like wearing socks. Good pulses and circulation, not diabetic. B12 deficiency considered as a potential cause, but not recently checked. Likely age-related and hereditary factors contributing. Limited treatment options for numbness, with focus on symptom management. - Check B12 levels if not done recently. - Consider acupuncture as a supportive therapy; consult primary care, neurologist, or rheumatologist for referrals. - Recommend alpha lipoic acid supplement for nerve health. - Recommend Nervive supplement (B12 and B6) for nerve support. - Advise daily foot checks and avoid going barefoot to prevent unnoticed injuries. - Emphasize monitoring for progression affecting balance and walking. - Caution against therapies with limited clinical evidence, such as red light therapy.      Return if symptoms worsen or fail to improve.

## 2024-01-23 ENCOUNTER — Ambulatory Visit: Attending: Rheumatology

## 2024-01-23 DIAGNOSIS — R262 Difficulty in walking, not elsewhere classified: Secondary | ICD-10-CM | POA: Insufficient documentation

## 2024-01-23 DIAGNOSIS — R293 Abnormal posture: Secondary | ICD-10-CM | POA: Insufficient documentation

## 2024-01-23 DIAGNOSIS — M6281 Muscle weakness (generalized): Secondary | ICD-10-CM | POA: Diagnosis not present

## 2024-01-23 DIAGNOSIS — R252 Cramp and spasm: Secondary | ICD-10-CM | POA: Insufficient documentation

## 2024-01-23 NOTE — Therapy (Signed)
 OUTPATIENT PHYSICAL THERAPY LOWER EXTREMITY TREATMENT NOTE   Patient Name: Terri Hudson MRN: [id number] DOB:08-15-1950, 73 y.o., female Today's Date: 01/23/2024  END OF SESSION:  PT End of Session - 01/23/24 1549     Visit Number 3    Date for PT Re-Evaluation 02/12/24    Authorization Type [employer]    Progress Note Due on Visit 10    PT Start Time 0200    PT Stop Time 0245    PT Time Calculation (min) 45 min    Activity Tolerance Patient tolerated treatment well    Behavior During Therapy [facility] for tasks assessed/performed           Past Medical History:  Diagnosis Date   Leukopenia    Migraine    Osteoarthritis    Osteoporosis 02/12/2018   Polymyalgia rheumatica (HCC)    Ulcerative colitis (HCC)    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HCC)    Past Surgical History:  Procedure Laterality Date   COLONOSCOPY     HAND SURGERY Left    2015 pt reports cutting her hand accidentally and needing exploratory surgery to check for nerve damage   KNEE SURGERY Right    Patient Active Problem List   Diagnosis Date Noted   Small fiber neuropathy 06/20/2021   Family history of amyloidosis 06/20/2021   Numbness and tingling of foot 07/28/2020   Rheumatoid arthritis involving multiple sites with positive rheumatoid factor (HCC) 06/15/2020   High risk medication use 11/20/2017   On prednisone  therapy 11/20/2017   Primary osteoarthritis of both knees 11/20/2017   Spondylosis of lumbar spine 11/20/2017   Ulcerative colitis (HCC) 11/20/2017   Abnormal laboratory test 11/20/2017   Polymyalgia rheumatica (HCC) 06/07/2017    PCP: Dr. Chrystal  REFERRING PROVIDER: Dolphus Reiter, MD  REFERRING DIAG: R26.89 (ICD-10-CM) - Poor balance  THERAPY DIAG:  Difficulty in walking, not elsewhere classified  Muscle weakness (generalized)  Abnormal posture  Cramp and spasm  Rationale for Evaluation and Treatment: Rehabilitation  ONSET DATE: 11/20/2023  SUBJECTIVE:    SUBJECTIVE STATEMENT: Patient reports no falls and requests to work on standing to floor transfers today.   PERTINENT HISTORY: PMR history PAIN:  01/23/24 Are you having pain? Yes: NPRS scale: 0/10 Pain location: legs and feet Pain description: aching, like a tight stocking is on them Aggravating factors: trying to sleep Relieving factors: movement  PRECAUTIONS: Fall  RED FLAGS: None   WEIGHT BEARING RESTRICTIONS: No  FALLS:  Has patient fallen in last 6 months? No  LIVING ENVIRONMENT: Lives with: lives with their spouse Lives in: House/apartment Stairs: Yes: Internal: 14 steps; on right going up and External: 3 steps; none Has following equipment at home: None  OCCUPATION: Civil engineer, contracting: Chief Financial Officer, sitting for long periods of time  PLOF: Independent  PATIENT GOALS: to be able to sit without plopping   NEXT MD VISIT: October  OBJECTIVE:  Note: Objective measures were completed at Evaluation unless otherwise noted.  DIAGNOSTIC FINDINGS: na  PATIENT SURVEYS:  LEFS  Extreme difficulty/unable (0), Quite a bit of difficulty (1), Moderate difficulty (2), Little difficulty (3), No difficulty (4) Survey date:    Any of your usual work, housework or school activities 4  2. Usual hobbies, recreational or sporting activities 4  3. Getting into/out of the bath 4  4. Walking between rooms 4  5. Putting on socks/shoes 4  6. Squatting  0  7. Lifting an object, like a bag of groceries from the floor 2  8. Performing  light activities around your home 4  9. Performing heavy activities around your home 2  10. Getting into/out of a car 3  11. Walking 2 blocks 4  12. Walking 1 mile 4  13. Going up/down 10 stairs (1 flight) 4  14. Standing for 1 hour 3  15.  sitting for 1 hour 4  16. Running on even ground 3  17. Running on uneven ground 2  18. Making sharp turns while running fast 0  19. Hopping  2  20. Rolling over in bed 4  Score total:  61      COGNITION: Overall cognitive status: Within functional limits for tasks assessed     SENSATION: WFL Patient has diagnosed neuropathy per her report  POSTURE: rounded shoulders, forward head, and increased thoracic kyphosis  PALPATION: na  LOWER EXTREMITY ROM:  WFL  LOWER EXTREMITY MMT:  MMT Right eval Left eval  Hip flexion 4 4  Hip extension 4- 4-  Hip abduction 4 4  Hip adduction 4+ 4+  Hip internal rotation 4- 4-  Hip external rotation 4- 4-  Knee flexion 4+ 4+  Knee extension 4+ 4+  Ankle dorsiflexion 4+ 4+  Ankle plantarflexion    Ankle inversion    Ankle eversion     (Blank rows = not tested)   FUNCTIONAL TESTS:  5 times sit to stand: 12.76 sec Timed up and go (TUG): 7.94 sec  GAIT: Distance walked: 30 feet Assistive device utilized: None Level of assistance: Modified independence Comments: short step length                                                                                                                                TREATMENT DATE:   01/23/24 Nustep level 5 for 5 min- SPT/PT present to discuss pt status  Seated marches with yellow loop 2x10 Matrix machine hip extensions 2x10 each side 35# Airex tandem walking 4 laps (down and back is one)-difficult  Seated LAQ 3x10 3# Airex lateral side steps 4 laps  Transfers- using mat on floor, from standing to floor and vice versa Pt education on various ways to perform transfer using different body mechanics and manuvers Fire hydrants in quadriped x10 each side   01/09/24 Lateral band walks x 3 laps of 15 feet with yellow loop Seated marches with yellow loop 2x10 Sit to stand in staggered stance x 10 each LE (vc's for avoiding knee valgus on eccentric lowering) Speed squats 2 x 30 sec Squat hold x 10 holding 10 sec, 2 sec rest at top Quad burners x 20 on mat table using dumbbells for wrist  Seated opposite elbow to knee 2 x 10 each side Cone touches 3 x 10 each LE Supine hamstring  sliders in bridge position using sliders x 20 total alternating (10 ea leg)  12/18/23 Initial eval completed and initiated HEP   PATIENT EDUCATION:  Education details: Initiated HEP, educated on various contributors  to balance issues, various treatments for neuropathy, safety considerations and fall prevention. Person educated: Patient Education method: Explanation, Demonstration, Verbal cues, and Handouts Education comprehension: verbalized understanding, returned demonstration, and verbal cues required  HOME EXERCISE PROGRAM:  Access Code: [email or web address] URL: [email or web address] Date: 01/23/2024 Prepared by: Burnard   Program Notes With the squat position, you can do regular squats 2 x 10, speed squats 2 x 30 sec and squat hold x 10 holding 10 seconds with a 2 sec pause at top. Also try quad burners x 20 or 2 x 10   Exercises - Seated Hip Abduction with Resistance  - 1 x daily - 7 x weekly - 3 sets - 10 reps - Hooklying Isometric Clamshell  - 1 x daily - 7 x weekly - 3 sets - 10 reps - Clamshell with Resistance  - 1 x daily - 7 x weekly - 3 sets - 10 reps - Sidelying Diagonal Hip Abduction  - 1 x daily - 7 x weekly - 3 sets - 10 reps - Side Stepping with Resistance at Ankles  - 1 x daily - 7 x weekly - 3 sets - 10 reps - Squat with Chair Touch  - 1 x daily - 7 x weekly - 3 sets - 10 reps - Sitting Knee Extension with Resistance  - 2 x daily - 7 x weekly - 2 sets - 10 reps - 5 hold - Quadruped Fire Hydrant  - 1 x daily - 7 x weekly - 10 reps - Wall Sit  - 1 x daily - 7 x weekly - 1 sets - 5 reps - 30 seconds  hold ASSESSMENT:  CLINICAL IMPRESSION: Pt tolerated new exercises today well, including long arc quad and fire hydrants. Although she demonstrates good functional strength, she presents with unsteadiness/shakiness in hips/knees during isolated muscle hip strengthening. She liked the glute med/max exercises, esp LAQ because she states those are the muscles she needs  strong for safe tranfers with her grandchildren, but feels weak in. SPT/PT taught her ways to get down onto the floor and back up safely so she has options to try. She is well motivated and compliant. Pt will benefit from skilled therapy to address the deficits below and move closer to her goal related activities.    OBJECTIVE IMPAIRMENTS: decreased balance, decreased knowledge of condition, difficulty walking, decreased ROM, decreased strength, increased fascial restrictions, impaired flexibility, improper body mechanics, postural dysfunction, and pain.   ACTIVITY LIMITATIONS: lifting, bending, standing, squatting, stairs, transfers, toileting, and caring for others  PARTICIPATION LIMITATIONS: meal prep, cleaning, laundry, driving, shopping, community activity, and yard work  PERSONAL FACTORS: Fitness and Past/current experiences are also affecting patient's functional outcome.   REHAB POTENTIAL: Good  CLINICAL DECISION MAKING: Stable/uncomplicated  EVALUATION COMPLEXITY: Low   GOALS: Goals reviewed with patient? Yes  SHORT TERM GOALS: Target date: 01/15/2024  Patient will be independent with initial HEP  Baseline: Goal status: In Progress  2.  Pain report to be no greater than 4/10  Baseline:  Goal status: MET 01/09/24   LONG TERM GOALS: Target date: 02/12/2024  Patient to report pain no greater than 2/10  Baseline:  Goal status: MET 01/23/24  2.  Patient to be independent with advanced HEP  Baseline:  Goal status: Progressing   3.  Patient to be able to bend, stoop and squat with controlled decent safely Baseline:  Goal status: INITIAL  4.  No falls during episode of PT Baseline:  Goal status: INITIAL  5.  Patient to demonstrate upright posture for 100 feet without vc's Baseline:  Goal status: INITIAL  6.  Patient to demonstrate proper alignment with 5 times sit to stand under 20 sec Baseline:  Goal status: IN progress    PLAN:  PT FREQUENCY: 1x/week  PT  DURATION: 8 weeks  PLANNED INTERVENTIONS: 97110-Therapeutic exercises, 97530- Therapeutic activity, 97112- Neuromuscular re-education, 97535- Self Care, [id number]- Manual therapy, [phone]- Gait training, [phone]- Aquatic Therapy, Patient/Family education, Balance training, Stair training, Taping, Joint mobilization, Vestibular training, Visual/preceptual remediation/compensation, DME instructions, and Cryotherapy  PLAN FOR NEXT SESSION:  Functional training, proximal hip strengthening, eccentric control training for quads and hip, planks and side planks, LAQ, transfers   Lavanda Cleverly, SPT 01/23/24 3:59 PM I agree with the following treatment note after reviewing documentation. This session was performed under the supervision of a licensed clinician. Burnard Joy, PT 01/23/24 4:05 PM  [facility] [address], [address] [place], [place] [address] Phone # [phone] Fax [phone]

## 2024-01-30 ENCOUNTER — Ambulatory Visit

## 2024-01-30 DIAGNOSIS — R252 Cramp and spasm: Secondary | ICD-10-CM | POA: Diagnosis not present

## 2024-01-30 DIAGNOSIS — R262 Difficulty in walking, not elsewhere classified: Secondary | ICD-10-CM

## 2024-01-30 DIAGNOSIS — R293 Abnormal posture: Secondary | ICD-10-CM

## 2024-01-30 DIAGNOSIS — M6281 Muscle weakness (generalized): Secondary | ICD-10-CM

## 2024-01-30 NOTE — Therapy (Signed)
 OUTPATIENT PHYSICAL THERAPY LOWER EXTREMITY TREATMENT NOTE   Patient Name: Terri Hudson MRN: [id number] DOB:04/13/1951, 73 y.o., female Today's Date: 01/30/2024  END OF SESSION:  PT End of Session - 01/30/24 1619     Visit Number 4    Date for PT Re-Evaluation 02/12/24    Authorization Type [employer]    Progress Note Due on Visit 10    PT Start Time 1619    PT Stop Time 1700    PT Time Calculation (min) 41 min    Activity Tolerance Patient tolerated treatment well    Behavior During Therapy [facility] for tasks assessed/performed           Past Medical History:  Diagnosis Date   Leukopenia    Migraine    Osteoarthritis    Osteoporosis 02/12/2018   Polymyalgia rheumatica (HCC)    Ulcerative colitis (HCC)    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HCC)    Past Surgical History:  Procedure Laterality Date   COLONOSCOPY     HAND SURGERY Left    2015 pt reports cutting her hand accidentally and needing exploratory surgery to check for nerve damage   KNEE SURGERY Right    Patient Active Problem List   Diagnosis Date Noted   Small fiber neuropathy 06/20/2021   Family history of amyloidosis 06/20/2021   Numbness and tingling of foot 07/28/2020   Rheumatoid arthritis involving multiple sites with positive rheumatoid factor (HCC) 06/15/2020   High risk medication use 11/20/2017   On prednisone  therapy 11/20/2017   Primary osteoarthritis of both knees 11/20/2017   Spondylosis of lumbar spine 11/20/2017   Ulcerative colitis (HCC) 11/20/2017   Abnormal laboratory test 11/20/2017   Polymyalgia rheumatica (HCC) 06/07/2017    PCP: Dr. Chrystal  REFERRING PROVIDER: Dolphus Reiter, MD  REFERRING DIAG: R26.89 (ICD-10-CM) - Poor balance  THERAPY DIAG:  Difficulty in walking, not elsewhere classified  Muscle weakness (generalized)  Abnormal posture  Cramp and spasm  Rationale for Evaluation and Treatment: Rehabilitation  ONSET DATE: 11/20/2023  SUBJECTIVE:    SUBJECTIVE STATEMENT: Patient reports no falls.  Having a little bit of a headache so asked if we could do some simpler things.     PERTINENT HISTORY: PMR history PAIN:  01/30/24 Are you having pain? Yes: NPRS scale: 0/10 Pain location: legs and feet Pain description: aching, like a tight stocking is on them Aggravating factors: trying to sleep Relieving factors: movement  PRECAUTIONS: Fall  RED FLAGS: None   WEIGHT BEARING RESTRICTIONS: No  FALLS:  Has patient fallen in last 6 months? No  LIVING ENVIRONMENT: Lives with: lives with their spouse Lives in: House/apartment Stairs: Yes: Internal: 14 steps; on right going up and External: 3 steps; none Has following equipment at home: None  OCCUPATION: Civil engineer, contracting: Chief Financial Officer, sitting for long periods of time  PLOF: Independent  PATIENT GOALS: to be able to sit without plopping   NEXT MD VISIT: October  OBJECTIVE:  Note: Objective measures were completed at Evaluation unless otherwise noted.  DIAGNOSTIC FINDINGS: na  PATIENT SURVEYS:  LEFS  Extreme difficulty/unable (0), Quite a bit of difficulty (1), Moderate difficulty (2), Little difficulty (3), No difficulty (4) Survey date:    Any of your usual work, housework or school activities 4  2. Usual hobbies, recreational or sporting activities 4  3. Getting into/out of the bath 4  4. Walking between rooms 4  5. Putting on socks/shoes 4  6. Squatting  0  7. Lifting an object, like a bag  of groceries from the floor 2  8. Performing light activities around your home 4  9. Performing heavy activities around your home 2  10. Getting into/out of a car 3  11. Walking 2 blocks 4  12. Walking 1 mile 4  13. Going up/down 10 stairs (1 flight) 4  14. Standing for 1 hour 3  15.  sitting for 1 hour 4  16. Running on even ground 3  17. Running on uneven ground 2  18. Making sharp turns while running fast 0  19. Hopping  2  20. Rolling over in bed 4  Score  total:  61     COGNITION: Overall cognitive status: Within functional limits for tasks assessed     SENSATION: WFL Patient has diagnosed neuropathy per her report  POSTURE: rounded shoulders, forward head, and increased thoracic kyphosis  PALPATION: na  LOWER EXTREMITY ROM:  WFL  LOWER EXTREMITY MMT:  MMT Right eval Left eval  Hip flexion 4 4  Hip extension 4- 4-  Hip abduction 4 4  Hip adduction 4+ 4+  Hip internal rotation 4- 4-  Hip external rotation 4- 4-  Knee flexion 4+ 4+  Knee extension 4+ 4+  Ankle dorsiflexion 4+ 4+  Ankle plantarflexion    Ankle inversion    Ankle eversion     (Blank rows = not tested)   FUNCTIONAL TESTS:  5 times sit to stand: 12.76 sec Timed up and go (TUG): 7.94 sec  GAIT: Distance walked: 30 feet Assistive device utilized: None Level of assistance: Modified independence Comments: short step length                                                                                                                                TREATMENT DATE:  01/30/24 Nustep level 5 for 5 min- PT present to discuss pt status  Matrix machine hip extension and abduction 2x10 each side 30# Seated clam with yellow loop x 20 Sit to stand with yellow loop x 10 (had her do about 3 more with vc's to avoid valgus moment on eccentric lowering) Supine clam with yellow loop x 20 Side lying clam with yellow loop 2 x 10 Supine short range SLR 2 x 10 Supine hamstring towel sliders 2 x 10 Bridge with march 2 x 20 Side lying hip abduction 2 x 10 0# Prone hip extension 2 x 10 0# Seated LAQ 3x10 5# Seated march x 20 with 5#  01/23/24 Nustep level 5 for 5 min- SPT/PT present to discuss pt status  Seated marches with yellow loop 2x10 Matrix machine hip extensions 2x10 each side 35# Airex tandem walking 4 laps (down and back is one)-difficult  Seated LAQ 3x10 3# Airex lateral side steps 4 laps  Transfers- using mat on floor, from standing to floor and vice  versa Pt education on various ways to perform transfer using different body mechanics and manuvers Fire hydrants in quadriped x10  each side   01/09/24 Lateral band walks x 3 laps of 15 feet with yellow loop Seated marches with yellow loop 2x10 Sit to stand in staggered stance x 10 each LE (vc's for avoiding knee valgus on eccentric lowering) Speed squats 2 x 30 sec Squat hold x 10 holding 10 sec, 2 sec rest at top Quad burners x 20 on mat table using dumbbells for wrist  Seated opposite elbow to knee 2 x 10 each side Cone touches 3 x 10 each LE Supine hamstring sliders in bridge position using sliders x 20 total alternating (10 ea leg)  12/18/23 Initial eval completed and initiated HEP   PATIENT EDUCATION:  Education details: Initiated HEP, educated on various contributors to balance issues, various treatments for neuropathy, safety considerations and fall prevention. Person educated: Patient Education method: Explanation, Demonstration, Verbal cues, and Handouts Education comprehension: verbalized understanding, returned demonstration, and verbal cues required  HOME EXERCISE PROGRAM:  Access Code: [email or web address] URL: [email or web address] Date: 01/23/2024 Prepared by: Burnard   Program Notes With the squat position, you can do regular squats 2 x 10, speed squats 2 x 30 sec and squat hold x 10 holding 10 seconds with a 2 sec pause at top. Also try quad burners x 20 or 2 x 10   Exercises - Seated Hip Abduction with Resistance  - 1 x daily - 7 x weekly - 3 sets - 10 reps - Hooklying Isometric Clamshell  - 1 x daily - 7 x weekly - 3 sets - 10 reps - Clamshell with Resistance  - 1 x daily - 7 x weekly - 3 sets - 10 reps - Sidelying Diagonal Hip Abduction  - 1 x daily - 7 x weekly - 3 sets - 10 reps - Side Stepping with Resistance at Ankles  - 1 x daily - 7 x weekly - 3 sets - 10 reps - Squat with Chair Touch  - 1 x daily - 7 x weekly - 3 sets - 10 reps - Sitting Knee  Extension with Resistance  - 2 x daily - 7 x weekly - 2 sets - 10 reps - 5 hold - Quadruped Fire Hydrant  - 1 x daily - 7 x weekly - 10 reps - Wall Sit  - 1 x daily - 7 x weekly - 1 sets - 5 reps - 30 seconds  hold ASSESSMENT:  CLINICAL IMPRESSION: Pt is progressing appropriately.  She continues to fatigue easily with proximal hip exercises but is showing significant improvement with functional sit to stand and all transfers.  Pt will benefit from skilled therapy to address the deficits below and move closer to her goal related activities.    OBJECTIVE IMPAIRMENTS: decreased balance, decreased knowledge of condition, difficulty walking, decreased ROM, decreased strength, increased fascial restrictions, impaired flexibility, improper body mechanics, postural dysfunction, and pain.   ACTIVITY LIMITATIONS: lifting, bending, standing, squatting, stairs, transfers, toileting, and caring for others  PARTICIPATION LIMITATIONS: meal prep, cleaning, laundry, driving, shopping, community activity, and yard work  PERSONAL FACTORS: Fitness and Past/current experiences are also affecting patient's functional outcome.   REHAB POTENTIAL: Good  CLINICAL DECISION MAKING: Stable/uncomplicated  EVALUATION COMPLEXITY: Low   GOALS: Goals reviewed with patient? Yes  SHORT TERM GOALS: Target date: 01/15/2024  Patient will be independent with initial HEP  Baseline: Goal status: MET 01/30/24  2.  Pain report to be no greater than 4/10  Baseline:  Goal status: MET 01/09/24   LONG TERM GOALS: Target date: 02/12/2024  Patient  to report pain no greater than 2/10  Baseline:  Goal status: MET 01/23/24  2.  Patient to be independent with advanced HEP  Baseline:  Goal status: Progressing   3.  Patient to be able to bend, stoop and squat with controlled decent safely Baseline:  Goal status: MET 01/30/24  4.  No falls during episode of PT Baseline:  Goal status: MET 01/30/24  5.  Patient to demonstrate  upright posture for 100 feet without vc's Baseline:  Goal status: In Progress  6.  Patient to demonstrate proper alignment with 5 times sit to stand under 20 sec Baseline:  Goal status: IN progress    PLAN:  PT FREQUENCY: 1x/week  PT DURATION: 8 weeks  PLANNED INTERVENTIONS: 97110-Therapeutic exercises, 97530- Therapeutic activity, 97112- Neuromuscular re-education, 97535- Self Care, 02859- Manual therapy, [phone]- Gait training, [phone]- Aquatic Therapy, Patient/Family education, Balance training, Stair training, Taping, Joint mobilization, Vestibular training, Visual/preceptual remediation/compensation, DME instructions, and Cryotherapy  PLAN FOR NEXT SESSION:  Functional training, proximal hip strengthening, eccentric control training for quads and hip, planks and side planks, LAQ, transfers   Hopkins B. Celese Banner, PT 01/30/24 5:07 PM [facility] [address] [place], [place] [address] Phone # [phone] Fax [phone]

## 2024-02-06 ENCOUNTER — Ambulatory Visit

## 2024-02-06 DIAGNOSIS — R252 Cramp and spasm: Secondary | ICD-10-CM

## 2024-02-06 DIAGNOSIS — R293 Abnormal posture: Secondary | ICD-10-CM

## 2024-02-06 DIAGNOSIS — M6281 Muscle weakness (generalized): Secondary | ICD-10-CM

## 2024-02-06 DIAGNOSIS — R262 Difficulty in walking, not elsewhere classified: Secondary | ICD-10-CM | POA: Diagnosis not present

## 2024-02-06 NOTE — Therapy (Signed)
 OUTPATIENT PHYSICAL THERAPY LOWER EXTREMITY TREATMENT NOTE   Patient Name: Terri Hudson MRN: [id number] DOB:Mar 06, 1951, 73 y.o., female Today's Date: 02/06/2024  END OF SESSION:  PT End of Session - 02/06/24 1444     Visit Number 5    Date for Recertification  02/12/24    Authorization Type [employer]    Progress Note Due on Visit 10    PT Start Time 1401    PT Stop Time 1446    PT Time Calculation (min) 45 min    Activity Tolerance Patient tolerated treatment well    Behavior During Therapy [facility] for tasks assessed/performed            Past Medical History:  Diagnosis Date   Leukopenia    Migraine    Osteoarthritis    Osteoporosis 02/12/2018   Polymyalgia rheumatica    Ulcerative colitis (HCC)    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HCC)    Past Surgical History:  Procedure Laterality Date   COLONOSCOPY     HAND SURGERY Left    2015 pt reports cutting her hand accidentally and needing exploratory surgery to check for nerve damage   KNEE SURGERY Right    Patient Active Problem List   Diagnosis Date Noted   Small fiber neuropathy 06/20/2021   Family history of amyloidosis 06/20/2021   Numbness and tingling of foot 07/28/2020   Rheumatoid arthritis involving multiple sites with positive rheumatoid factor (HCC) 06/15/2020   High risk medication use 11/20/2017   On prednisone  therapy 11/20/2017   Primary osteoarthritis of both knees 11/20/2017   Spondylosis of lumbar spine 11/20/2017   Ulcerative colitis (HCC) 11/20/2017   Abnormal laboratory test 11/20/2017   Polymyalgia rheumatica 06/07/2017    PCP: Dr. Chrystal MART PROVIDER: Dolphus Reiter, MD  REFERRING DIAG: R26.89 (ICD-10-CM) - Poor balance  THERAPY DIAG:  Difficulty in walking, not elsewhere classified  Muscle weakness (generalized)  Abnormal posture  Cramp and spasm  Rationale for Evaluation and Treatment: Rehabilitation  ONSET DATE: 11/20/2023  SUBJECTIVE:   SUBJECTIVE  STATEMENT: I've been working on my exercises and going to the gym  PERTINENT HISTORY: PMR history PAIN:  02/06/24 Are you having pain? Yes: NPRS scale: 0/10 Pain location: legs and feet Pain description: aching, like a tight stocking is on them Aggravating factors: trying to sleep Relieving factors: movement  PRECAUTIONS: Fall  RED FLAGS: None   WEIGHT BEARING RESTRICTIONS: No  FALLS:  Has patient fallen in last 6 months? No  LIVING ENVIRONMENT: Lives with: lives with their spouse Lives in: House/apartment Stairs: Yes: Internal: 14 steps; on right going up and External: 3 steps; none Has following equipment at home: None  OCCUPATION: Civil engineer, contracting: Chief Financial Officer, sitting for long periods of time  PLOF: Independent  PATIENT GOALS: to be able to sit without plopping   NEXT MD VISIT: October  OBJECTIVE:  Note: Objective measures were completed at Evaluation unless otherwise noted.  DIAGNOSTIC FINDINGS: na  PATIENT SURVEYS:  LEFS  Extreme difficulty/unable (0), Quite a bit of difficulty (1), Moderate difficulty (2), Little difficulty (3), No difficulty (4) Survey date:    Any of your usual work, housework or school activities 4  2. Usual hobbies, recreational or sporting activities 4  3. Getting into/out of the bath 4  4. Walking between rooms 4  5. Putting on socks/shoes 4  6. Squatting  0  7. Lifting an object, like a bag of groceries from the floor 2  8. Performing light activities around your home  4  9. Performing heavy activities around your home 2  10. Getting into/out of a car 3  11. Walking 2 blocks 4  12. Walking 1 mile 4  13. Going up/down 10 stairs (1 flight) 4  14. Standing for 1 hour 3  15.  sitting for 1 hour 4  16. Running on even ground 3  17. Running on uneven ground 2  18. Making sharp turns while running fast 0  19. Hopping  2  20. Rolling over in bed 4  Score total:  61     COGNITION: Overall cognitive status: Within  functional limits for tasks assessed     SENSATION: WFL Patient has diagnosed neuropathy per her report  POSTURE: rounded shoulders, forward head, and increased thoracic kyphosis  PALPATION: na  LOWER EXTREMITY ROM:  WFL  LOWER EXTREMITY MMT:  MMT Right eval Left eval  Hip flexion 4 4  Hip extension 4- 4-  Hip abduction 4 4  Hip adduction 4+ 4+  Hip internal rotation 4- 4-  Hip external rotation 4- 4-  Knee flexion 4+ 4+  Knee extension 4+ 4+  Ankle dorsiflexion 4+ 4+  Ankle plantarflexion    Ankle inversion    Ankle eversion     (Blank rows = not tested)   FUNCTIONAL TESTS:  5 times sit to stand: 12.76 sec Timed up and go (TUG): 7.94 sec  GAIT: Distance walked: 30 feet Assistive device utilized: None Level of assistance: Modified independence Comments: short step length                                                                                                                                TREATMENT DATE:  02/06/24 Nustep level 5 for 5 min- PT present to discuss pt status  Standing on balance pad: 3# hip abduction and extension 2x10 bil each  Farmers Carry: alternating step taps 5# 20 reps Rt and Lt  4 step-downs: Rt and Lt 2x10 each -uncontrolled at first and improved with practice Supine clam with green band x 20 Side lying clam with yellow loop 2 x 10 Supine short range SLR 2 x 10 Seated hamstring stretch 2x20 seconds  Bridge with green band around thighs: 2x10 with 5 hold  Side lying hip abduction 2 x 10 0# Prone hip extension 2 x 10 0# Seated LAQ 3x10 5# Seated march x 20 with 5#  01/30/24 Nustep level 5 for 5 min- PT present to discuss pt status  Hip Matrix  Seated clam with yellow loop x 20 Sit to stand with yellow loop x 10 (had her do about 3 more with vc's to avoid valgus moment on eccentric lowering) Supine clam with yellow loop x 20 Side lying clam with yellow loop 2 x 10 Supine short range SLR 2 x 10 Supine hamstring towel  sliders 2 x 10 Bridge with march 2 x 20 Side lying hip abduction 2 x 10 0# Prone  hip extension 2 x 10 0# Seated LAQ 3x10 5# Seated march x 20 with 5#  01/23/24 Nustep level 5 for 5 min- SPT/PT present to discuss pt status  Seated marches with yellow loop 2x10 Matrix machine hip extensions 2x10 each side 35# Airex tandem walking 4 laps (down and back is one)-difficult  Seated LAQ 3x10 3# Airex lateral side steps 4 laps  Transfers- using mat on floor, from standing to floor and vice versa Pt education on various ways to perform transfer using different body mechanics and manuvers Fire hydrants in quadriped x10 each side    PATIENT EDUCATION:  Education details: Initiated HEP, educated on various contributors to balance issues, various treatments for neuropathy, safety considerations and fall prevention. Person educated: Patient Education method: Explanation, Demonstration, Verbal cues, and Handouts Education comprehension: verbalized understanding, returned demonstration, and verbal cues required  HOME EXERCISE PROGRAM:  Access Code: [email or web address] URL: [email or web address] Date: 01/23/2024 Prepared by: Burnard   Program Notes With the squat position, you can do regular squats 2 x 10, speed squats 2 x 30 sec and squat hold x 10 holding 10 seconds with a 2 sec pause at top. Also try quad burners x 20 or 2 x 10   Exercises - Seated Hip Abduction with Resistance  - 1 x daily - 7 x weekly - 3 sets - 10 reps - Hooklying Isometric Clamshell  - 1 x daily - 7 x weekly - 3 sets - 10 reps - Clamshell with Resistance  - 1 x daily - 7 x weekly - 3 sets - 10 reps - Sidelying Diagonal Hip Abduction  - 1 x daily - 7 x weekly - 3 sets - 10 reps - Side Stepping with Resistance at Ankles  - 1 x daily - 7 x weekly - 3 sets - 10 reps - Squat with Chair Touch  - 1 x daily - 7 x weekly - 3 sets - 10 reps - Sitting Knee Extension with Resistance  - 2 x daily - 7 x weekly - 2 sets - 10 reps -  5 hold - Quadruped Fire Hydrant  - 1 x daily - 7 x weekly - 10 reps - Wall Sit  - 1 x daily - 7 x weekly - 1 sets - 5 reps - 30 seconds  hold ASSESSMENT:  CLINICAL IMPRESSION: Pt continues to work on her HEP and goes to the gym 3-4x/week.  Pt is most challenged with step downs in her daily activities. Her control improved with practice on a 4 step and she went from requiring UE support to no support and good stability.  She will practice this at home.  She was challenged by advanced balance exercises today and showed improved stability on the 2nd set of each.  PT monitored for safety, fatigue and technique throughout session.  Pt will benefit from skilled therapy to address the deficits below and move closer to her goal related activities.    OBJECTIVE IMPAIRMENTS: decreased balance, decreased knowledge of condition, difficulty walking, decreased ROM, decreased strength, increased fascial restrictions, impaired flexibility, improper body mechanics, postural dysfunction, and pain.   ACTIVITY LIMITATIONS: lifting, bending, standing, squatting, stairs, transfers, toileting, and caring for others  PARTICIPATION LIMITATIONS: meal prep, cleaning, laundry, driving, shopping, community activity, and yard work  PERSONAL FACTORS: Fitness and Past/current experiences are also affecting patient's functional outcome.   REHAB POTENTIAL: Good  CLINICAL DECISION MAKING: Stable/uncomplicated  EVALUATION COMPLEXITY: Low   GOALS: Goals reviewed with patient? Yes  SHORT TERM  GOALS: Target date: 01/15/2024  Patient will be independent with initial HEP  Baseline: Goal status: MET 01/30/24  2.  Pain report to be no greater than 4/10  Baseline:  Goal status: MET 01/09/24   LONG TERM GOALS: Target date: 02/12/2024  Patient to report pain no greater than 2/10  Baseline:  Goal status: MET 01/23/24  2.  Patient to be independent with advanced HEP  Baseline:  Goal status: Progressing   3.  Patient to  be able to bend, stoop and squat with controlled decent safely Baseline:  Goal status: MET 01/30/24  4.  No falls during episode of PT Baseline:  Goal status: MET 01/30/24  5.  Patient to demonstrate upright posture for 100 feet without vc's Baseline:  Goal status: In Progress  6.  Patient to demonstrate proper alignment with 5 times sit to stand under 20 sec Baseline:  Goal status: IN progress    PLAN:  PT FREQUENCY: 1x/week  PT DURATION: 8 weeks  PLANNED INTERVENTIONS: 97110-Therapeutic exercises, 97530- Therapeutic activity, 97112- Neuromuscular re-education, 97535- Self Care, 02859- Manual therapy, [phone]- Gait training, [phone]- Aquatic Therapy, Patient/Family education, Balance training, Stair training, Taping, Joint mobilization, Vestibular training, Visual/preceptual remediation/compensation, DME instructions, and Cryotherapy  PLAN FOR NEXT SESSION:  work on step-downs, balance, eccentric control, core strength. ERO needed next visit.  Pt will reduce to every other week.   Burnard Joy, PT 02/06/24 2:52 PM  [facility] [address] [place], [place] [address] Phone # [phone] Fax [phone]

## 2024-02-07 DIAGNOSIS — H43811 Vitreous degeneration, right eye: Secondary | ICD-10-CM | POA: Diagnosis not present

## 2024-02-13 ENCOUNTER — Ambulatory Visit: Attending: Rheumatology

## 2024-02-13 DIAGNOSIS — R252 Cramp and spasm: Secondary | ICD-10-CM | POA: Diagnosis not present

## 2024-02-13 DIAGNOSIS — M6281 Muscle weakness (generalized): Secondary | ICD-10-CM | POA: Diagnosis not present

## 2024-02-13 DIAGNOSIS — R262 Difficulty in walking, not elsewhere classified: Secondary | ICD-10-CM | POA: Insufficient documentation

## 2024-02-13 DIAGNOSIS — R293 Abnormal posture: Secondary | ICD-10-CM | POA: Diagnosis not present

## 2024-02-13 NOTE — Therapy (Signed)
 OUTPATIENT PHYSICAL THERAPY LOWER EXTREMITY TREATMENT NOTE   Patient Name: Terri Hudson MRN: [id number] DOB:Aug 27, 1950, 73 y.o., female Today's Date: 02/13/2024  END OF SESSION:  PT End of Session - 02/13/24 1405     Visit Number 6    Date for Recertification  02/12/24    Authorization Type [employer] Medicare    Progress Note Due on Visit 10    PT Start Time 1405    PT Stop Time 1445    PT Time Calculation (min) 40 min    Activity Tolerance Patient tolerated treatment well    Behavior During Therapy [facility] for tasks assessed/performed            Past Medical History:  Diagnosis Date   Leukopenia    Migraine    Osteoarthritis    Osteoporosis 02/12/2018   Polymyalgia rheumatica    Ulcerative colitis (HCC)    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HCC)    Past Surgical History:  Procedure Laterality Date   COLONOSCOPY     HAND SURGERY Left    2015 pt reports cutting her hand accidentally and needing exploratory surgery to check for nerve damage   KNEE SURGERY Right    Patient Active Problem List   Diagnosis Date Noted   Small fiber neuropathy 06/20/2021   Family history of amyloidosis 06/20/2021   Numbness and tingling of foot 07/28/2020   Rheumatoid arthritis involving multiple sites with positive rheumatoid factor (HCC) 06/15/2020   High risk medication use 11/20/2017   On prednisone  therapy 11/20/2017   Primary osteoarthritis of both knees 11/20/2017   Spondylosis of lumbar spine 11/20/2017   Ulcerative colitis (HCC) 11/20/2017   Abnormal laboratory test 11/20/2017   Polymyalgia rheumatica 06/07/2017    PCP: Dr. Chrystal  REFERRING PROVIDER: Dolphus Reiter, MD  REFERRING DIAG: R26.89 (ICD-10-CM) - Poor balance  THERAPY DIAG:  Difficulty in walking, not elsewhere classified - Plan: PT plan of care cert/re-cert  Muscle weakness (generalized) - Plan: PT plan of care cert/re-cert  Abnormal posture - Plan: PT plan of care cert/re-cert  Cramp and spasm -  Plan: PT plan of care cert/re-cert  Rationale for Evaluation and Treatment: Rehabilitation  ONSET DATE: 11/20/2023  SUBJECTIVE:   SUBJECTIVE STATEMENT: Patient reports she is doing good but she still struggles with being able to stoop or squat.  Hips still feel shaky at times.    PERTINENT HISTORY: PMR history PAIN:  02/13/24 Are you having pain? Yes: NPRS scale: 0/10 Pain location: legs and feet Pain description: aching, like a tight stocking is on them Aggravating factors: trying to sleep Relieving factors: movement  PRECAUTIONS: Fall  RED FLAGS: None   WEIGHT BEARING RESTRICTIONS: No  FALLS:  Has patient fallen in last 6 months? No  LIVING ENVIRONMENT: Lives with: lives with their spouse Lives in: House/apartment Stairs: Yes: Internal: 14 steps; on right going up and External: 3 steps; none Has following equipment at home: None  OCCUPATION: Civil engineer, contracting: Chief Financial Officer, sitting for long periods of time  PLOF: Independent  PATIENT GOALS: to be able to sit without plopping   NEXT MD VISIT: October  OBJECTIVE:  Note: Objective measures were completed at Evaluation unless otherwise noted.  DIAGNOSTIC FINDINGS: na  PATIENT SURVEYS:  LEFS  Extreme difficulty/unable (0), Quite a bit of difficulty (1), Moderate difficulty (2), Little difficulty (3), No difficulty (4) Survey date:  12/18/23 02/13/24  Any of your usual work, housework or school activities 4 4  2. Usual hobbies, recreational or sporting activities 4 4  3. Getting into/out of the bath 4 4  4. Walking between rooms 4 4  5. Putting on socks/shoes 4 4  6. Squatting  0 0  7. Lifting an object, like a bag of groceries from the floor 2 4  8. Performing light activities around your home 4 4  9. Performing heavy activities around your home 2 2  10. Getting into/out of a car 3 3  11. Walking 2 blocks 4 4  12. Walking 1 mile 4 4  13. Going up/down 10 stairs (1 flight) 4 4  14. Standing for 1 hour  3 3  15.  sitting for 1 hour 4 4  16. Running on even ground 3 3  17. Running on uneven ground 2 2  18. Making sharp turns while running fast 0 0  19. Hopping  2 2  20. Rolling over in bed 4 4  Score total:  61 63     COGNITION: Overall cognitive status: Within functional limits for tasks assessed     SENSATION: WFL Patient has diagnosed neuropathy per her report  POSTURE: rounded shoulders, forward head, and increased thoracic kyphosis  PALPATION: na  LOWER EXTREMITY ROM:  WFL  LOWER EXTREMITY MMT:  MMT Right eval Left eval Right 02/13/24 Left 02/13/24  Hip flexion 4 4 4+ 4+  Hip extension 4- 4- 4 4  Hip abduction 4 4 4+ 4+  Hip adduction 4+ 4+ 4+ 4+  Hip internal rotation 4- 4- 4 4  Hip external rotation 4- 4- 4 4  Knee flexion 4+ 4+ 4+ 4+  Knee extension 4+ 4+ 4+ 4+  Ankle dorsiflexion 4+ 4+ 4+ 4+  Ankle plantarflexion      Ankle inversion      Ankle eversion       (Blank rows = not tested)   FUNCTIONAL TESTS:  Initial eval: 5 times sit to stand: 12.76 sec Timed up and go (TUG): 7.94 sec  02/13/24: 5 times sit to stand: 9.36 sec Timed up and go (TUG): 6.55 sec  GAIT: Distance walked: 30 feet Assistive device utilized: None Level of assistance: Modified independence Comments: short step length                                                                                                                                TREATMENT DATE:  02/13/24 Recert assessment completed Lunge to BOSU x 10 each LE fwd then lateral Lateral band walks x 3 laps of 10 steps each Alternating tap ups on cone x 20 Step up and hold on balance pad x 10 each LE fwd then lateral  Squat to pick up teclor bars x 10 (heavy vc's and education on proper technique/ hip hinging) Rocker board x 2 min Gastroc stretch on rocker board 10 x 10 sec Soleus stretch on rocker board 5 x each LE   02/06/24 Nustep level 5 for 5 min- PT present to discuss pt status  Standing on balance  pad: 3# hip abduction and extension 2x10 bil each  Farmers Carry: alternating step taps 5# 20 reps Rt and Lt  4 step-downs: Rt and Lt 2x10 each -uncontrolled at first and improved with practice Supine clam with green band x 20 Side lying clam with yellow loop 2 x 10 Supine short range SLR 2 x 10 Seated hamstring stretch 2x20 seconds  Bridge with green band around thighs: 2x10 with 5 hold  Side lying hip abduction 2 x 10 0# Prone hip extension 2 x 10 0# Seated LAQ 3x10 5# Seated march x 20 with 5#  01/30/24 Nustep level 5 for 5 min- PT present to discuss pt status  Hip Matrix  Seated clam with yellow loop x 20 Sit to stand with yellow loop x 10 (had her do about 3 more with vc's to avoid valgus moment on eccentric lowering) Supine clam with yellow loop x 20 Side lying clam with yellow loop 2 x 10 Supine short range SLR 2 x 10 Supine hamstring towel sliders 2 x 10 Bridge with march 2 x 20 Side lying hip abduction 2 x 10 0# Prone hip extension 2 x 10 0# Seated LAQ 3x10 5# Seated march x 20 with 5#  01/23/24 Nustep level 5 for 5 min- SPT/PT present to discuss pt status  Seated marches with yellow loop 2x10 Matrix machine hip extensions 2x10 each side 35# Airex tandem walking 4 laps (down and back is one)-difficult  Seated LAQ 3x10 3# Airex lateral side steps 4 laps  Transfers- using mat on floor, from standing to floor and vice versa Pt education on various ways to perform transfer using different body mechanics and manuvers Fire hydrants in quadriped x10 each side    PATIENT EDUCATION:  Education details: Initiated HEP, educated on various contributors to balance issues, various treatments for neuropathy, safety considerations and fall prevention. Person educated: Patient Education method: Explanation, Demonstration, Verbal cues, and Handouts Education comprehension: verbalized understanding, returned demonstration, and verbal cues required  HOME EXERCISE PROGRAM: Access  Code: [email or web address] URL: [email or web address] Date: 02/13/2024 Prepared by: Delon Haddock  Program Notes With the squat position, you can do regular squats 2 x 10, speed squats 2 x 30 sec and squat hold x 10 holding 10 seconds with a 2 sec pause at top. Also try quad burners x 20 or 2 x 10  Exercises - Seated Hip Abduction with Resistance  - 1 x daily - 7 x weekly - 3 sets - 10 reps - Hooklying Isometric Clamshell  - 1 x daily - 7 x weekly - 3 sets - 10 reps - Clamshell with Resistance  - 1 x daily - 7 x weekly - 3 sets - 10 reps - Sidelying Diagonal Hip Abduction  - 1 x daily - 7 x weekly - 3 sets - 10 reps - Side Stepping with Resistance at Ankles  - 1 x daily - 7 x weekly - 3 sets - 10 reps - Squat with Chair Touch  - 1 x daily - 7 x weekly - 3 sets - 10 reps - Sitting Knee Extension with Resistance  - 2 x daily - 7 x weekly - 2 sets - 10 reps - 5 hold - Quadruped Fire Hydrant  - 1 x daily - 7 x weekly - 10 reps - Wall Sit  - 1 x daily - 7 x weekly - 1 sets - 5 reps - 30 seconds  hold - Forward Step Down  - 2  x daily - 7 x weekly - 10 reps - Gastroc Stretch on Wall  - 1 x daily - 7 x weekly - 1 sets - 5 reps - 10 sec hold - Standing Soleus Stretch  - 1 x daily - 7 x weekly - 1 sets - 5 reps - 10 sec hold - Kettlebell Squat  - 1 x daily - 7 x weekly - 3 sets - 10 reps ASSESSMENT:  CLINICAL IMPRESSION: Patient is progressing appropriately.  Her objective findings were much improved.  She is beginning to work out at her gym with more confidence.  She would benefit from continuing skilled therapy to address the deficits below and move closer to her goal related activities.    OBJECTIVE IMPAIRMENTS: decreased balance, decreased knowledge of condition, difficulty walking, decreased ROM, decreased strength, increased fascial restrictions, impaired flexibility, improper body mechanics, postural dysfunction, and pain.   ACTIVITY LIMITATIONS: lifting, bending, standing,  squatting, stairs, transfers, toileting, and caring for others  PARTICIPATION LIMITATIONS: meal prep, cleaning, laundry, driving, shopping, community activity, and yard work  PERSONAL FACTORS: Fitness and Past/current experiences are also affecting patient's functional outcome.   REHAB POTENTIAL: Good  CLINICAL DECISION MAKING: Stable/uncomplicated  EVALUATION COMPLEXITY: Low   GOALS: Goals reviewed with patient? Yes  SHORT TERM GOALS: Target date: 01/15/2024  Patient will be independent with initial HEP  Baseline: Goal status: MET 01/30/24  2.  Pain report to be no greater than 4/10  Baseline:  Goal status: MET 01/09/24   LONG TERM GOALS: Target date: 02/12/2024  Patient to report pain no greater than 2/10  Baseline:  Goal status: MET 01/23/24  2.  Patient to be independent with advanced HEP  Baseline:  Goal status: Progressing   3.  Patient to be able to bend, stoop and squat with controlled decent safely Baseline:  Goal status: MET 01/30/24  4.  No falls during episode of PT Baseline:  Goal status: MET 01/30/24  5.  Patient to demonstrate upright posture for 100 feet without vc's Baseline:  Goal status: In Progress  6.  Patient to demonstrate proper alignment with 5 times sit to stand under 20 sec Baseline:  Goal status: IN progress    PLAN:  PT FREQUENCY: 1x/week  PT DURATION: 8 weeks  PLANNED INTERVENTIONS: 97110-Therapeutic exercises, 97530- Therapeutic activity, 97112- Neuromuscular re-education, 97535- Self Care, 02859- Manual therapy, [phone]- Gait training, [phone]- Aquatic Therapy, Patient/Family education, Balance training, Stair training, Taping, Joint mobilization, Vestibular training, Visual/preceptual remediation/compensation, DME instructions, and Cryotherapy  PLAN FOR NEXT SESSION:  Continue proximal hip strengthening, work on step-downs, balance, eccentric control, core strength.  Pt will reduce to every other week.   Delon B. Johnothan Bascomb,  PT 02/13/24 7:30 PM [facility] [address] [place], [place] [address] Phone # [phone] Fax [phone]

## 2024-02-14 DIAGNOSIS — Z23 Encounter for immunization: Secondary | ICD-10-CM | POA: Diagnosis not present

## 2024-02-27 ENCOUNTER — Ambulatory Visit

## 2024-02-27 DIAGNOSIS — R293 Abnormal posture: Secondary | ICD-10-CM | POA: Diagnosis not present

## 2024-02-27 DIAGNOSIS — R252 Cramp and spasm: Secondary | ICD-10-CM | POA: Diagnosis not present

## 2024-02-27 DIAGNOSIS — R262 Difficulty in walking, not elsewhere classified: Secondary | ICD-10-CM

## 2024-02-27 DIAGNOSIS — M6281 Muscle weakness (generalized): Secondary | ICD-10-CM | POA: Diagnosis not present

## 2024-02-27 NOTE — Therapy (Signed)
 OUTPATIENT PHYSICAL THERAPY LOWER EXTREMITY TREATMENT NOTE   Patient Name: Terri Hudson MRN: [id number] DOB:[birth year], 73 y.o., female Today's Date: 02/27/2024  END OF SESSION:  PT End of Session - 02/27/24 1625     Visit Number 7    Date for Recertification  04/08/24    Authorization Type [employer]    Progress Note Due on Visit 10    PT Start Time 1620    PT Stop Time 1700    PT Time Calculation (min) 40 min    Activity Tolerance Patient tolerated treatment well    Behavior During Therapy [facility] for tasks assessed/performed            Past Medical History:  Diagnosis Date   Leukopenia    Migraine    Osteoarthritis    Osteoporosis 02/12/2018   Polymyalgia rheumatica    Ulcerative colitis (HCC)    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HCC)    Past Surgical History:  Procedure Laterality Date   COLONOSCOPY     HAND SURGERY Left    2015 pt reports cutting her hand accidentally and needing exploratory surgery to check for nerve damage   KNEE SURGERY Right    Patient Active Problem List   Diagnosis Date Noted   Small fiber neuropathy 06/20/2021   Family history of amyloidosis 06/20/2021   Numbness and tingling of foot 07/28/2020   Rheumatoid arthritis involving multiple sites with positive rheumatoid factor (HCC) 06/15/2020   High risk medication use 11/20/2017   On prednisone  therapy 11/20/2017   Primary osteoarthritis of both knees 11/20/2017   Spondylosis of lumbar spine 11/20/2017   Ulcerative colitis (HCC) 11/20/2017   Abnormal laboratory test 11/20/2017   Polymyalgia rheumatica 06/07/2017    PCP: Dr. Chrystal  REFERRING PROVIDER: Dolphus Reiter, MD  REFERRING DIAG: R26.89 (ICD-10-CM) - Poor balance  THERAPY DIAG:  Difficulty in walking, not elsewhere classified  Muscle weakness (generalized)  Cramp and spasm  Abnormal posture  Rationale for Evaluation and Treatment: Rehabilitation  ONSET DATE: 11/20/2023  SUBJECTIVE:    SUBJECTIVE STATEMENT: Patient reports she is doing better.  Feeling stronger but still struggles with a deeper squat.      PERTINENT HISTORY: PMR history PAIN:  02/27/24 Are you having pain? Yes: NPRS scale: 0/10 Pain location: legs and feet Pain description: aching, like a tight stocking is on them Aggravating factors: trying to sleep Relieving factors: movement  PRECAUTIONS: Fall  RED FLAGS: None   WEIGHT BEARING RESTRICTIONS: No  FALLS:  Has patient fallen in last 6 months? No  LIVING ENVIRONMENT: Lives with: lives with their spouse Lives in: House/apartment Stairs: Yes: Internal: 14 steps; on right going up and External: 3 steps; none Has following equipment at home: None  OCCUPATION: Civil engineer, contracting: Chief Financial Officer, sitting for long periods of time  PLOF: Independent  PATIENT GOALS: to be able to sit without plopping   NEXT MD VISIT: October  OBJECTIVE:  Note: Objective measures were completed at Evaluation unless otherwise noted.  DIAGNOSTIC FINDINGS: na  PATIENT SURVEYS:  LEFS  Extreme difficulty/unable (0), Quite a bit of difficulty (1), Moderate difficulty (2), Little difficulty (3), No difficulty (4) Survey date:  12/18/23 02/13/24  Any of your usual work, housework or school activities 4 4  2. Usual hobbies, recreational or sporting activities 4 4  3. Getting into/out of the bath 4 4  4. Walking between rooms 4 4  5. Putting on socks/shoes 4 4  6. Squatting  0 0  7. Lifting an object, like  a bag of groceries from the floor 2 4  8. Performing light activities around your home 4 4  9. Performing heavy activities around your home 2 2  10. Getting into/out of a car 3 3  11. Walking 2 blocks 4 4  12. Walking 1 mile 4 4  13. Going up/down 10 stairs (1 flight) 4 4  14. Standing for 1 hour 3 3  15.  sitting for 1 hour 4 4  16. Running on even ground 3 3  17. Running on uneven ground 2 2  18. Making sharp turns while running fast 0 0  19.  Hopping  2 2  20. Rolling over in bed 4 4  Score total:  61 63     COGNITION: Overall cognitive status: Within functional limits for tasks assessed     SENSATION: WFL Patient has diagnosed neuropathy per her report  POSTURE: rounded shoulders, forward head, and increased thoracic kyphosis  PALPATION: na  LOWER EXTREMITY ROM:  WFL  LOWER EXTREMITY MMT:  MMT Right eval Left eval Right 02/13/24 Left 02/13/24  Hip flexion 4 4 4+ 4+  Hip extension 4- 4- 4 4  Hip abduction 4 4 4+ 4+  Hip adduction 4+ 4+ 4+ 4+  Hip internal rotation 4- 4- 4 4  Hip external rotation 4- 4- 4 4  Knee flexion 4+ 4+ 4+ 4+  Knee extension 4+ 4+ 4+ 4+  Ankle dorsiflexion 4+ 4+ 4+ 4+  Ankle plantarflexion      Ankle inversion      Ankle eversion       (Blank rows = not tested)   FUNCTIONAL TESTS:  Initial eval: 5 times sit to stand: 12.76 sec Timed up and go (TUG): 7.94 sec  02/13/24: 5 times sit to stand: 9.36 sec Timed up and go (TUG): 6.55 sec  GAIT: Distance walked: 30 feet Assistive device utilized: None Level of assistance: Modified independence Comments: short step length                                                                                                                                TREATMENT DATE:  02/27/24 Nustep x 5 min level 4 Star excursion with slider x 5 each LE (vc' when standing on right to maintain alignment) Squat to pick up teclor bars x 10 (decreased vc's and education on proper technique/ hip hinging) Squat to 16 side of rit fit box x 10 Rocker board x 2 min Gastroc stretch on rocker board 10 x 10 sec Soleus stretch on rocker board 5 x each LE  Lunge to BOSU x 10 each LE fwd then lateral Seated clam with black loop x 20 Supine clam with black loop x 20 Side lying clam with yellow loop 2 x 10 each side  02/13/24 Recert assessment completed Lunge to BOSU x 10 each LE fwd then lateral Lateral band walks x 3 laps of 10 steps each Alternating  tap ups on cone x 20 Step up and hold on balance pad x 10 each LE fwd then lateral  Squat to pick up teclor bars x 10 (heavy vc's and education on proper technique/ hip hinging) Rocker board x 2 min Gastroc stretch on rocker board 10 x 10 sec Soleus stretch on rocker board 5 x each LE   02/06/24 Nustep level 5 for 5 min- PT present to discuss pt status  Standing on balance pad: 3# hip abduction and extension 2x10 bil each  [employer]: alternating step taps 5# 20 reps Rt and Lt  4 step-downs: Rt and Lt 2x10 each -uncontrolled at first and improved with practice Supine clam with green band x 20 Side lying clam with yellow loop 2 x 10 Supine short range SLR 2 x 10 Seated hamstring stretch 2x20 seconds  Bridge with green band around thighs: 2x10 with 5 hold  Side lying hip abduction 2 x 10 0# Prone hip extension 2 x 10 0# Seated LAQ 3x10 5# Seated march x 20 with 5#   PATIENT EDUCATION:  Education details: Initiated HEP, educated on various contributors to balance issues, various treatments for neuropathy, safety considerations and fall prevention. Person educated: Patient Education method: Explanation, Demonstration, Verbal cues, and Handouts Education comprehension: verbalized understanding, returned demonstration, and verbal cues required  HOME EXERCISE PROGRAM: Access Code: [email or web address] URL: [email or web address] Date: 02/13/2024 Prepared by: Delon Haddock  Program Notes With the squat position, you can do regular squats 2 x 10, speed squats 2 x 30 sec and squat hold x 10 holding 10 seconds with a 2 sec pause at top. Also try quad burners x 20 or 2 x 10  Exercises - Seated Hip Abduction with Resistance  - 1 x daily - 7 x weekly - 3 sets - 10 reps - Hooklying Isometric Clamshell  - 1 x daily - 7 x weekly - 3 sets - 10 reps - Clamshell with Resistance  - 1 x daily - 7 x weekly - 3 sets - 10 reps - Sidelying Diagonal Hip Abduction  - 1 x daily - 7 x weekly - 3  sets - 10 reps - Side Stepping with Resistance at Ankles  - 1 x daily - 7 x weekly - 3 sets - 10 reps - Squat with Chair Touch  - 1 x daily - 7 x weekly - 3 sets - 10 reps - Sitting Knee Extension with Resistance  - 2 x daily - 7 x weekly - 2 sets - 10 reps - 5 hold - Quadruped Fire Hydrant  - 1 x daily - 7 x weekly - 10 reps - Wall Sit  - 1 x daily - 7 x weekly - 1 sets - 5 reps - 30 seconds  hold - Forward Step Down  - 2 x daily - 7 x weekly - 10 reps - Gastroc Stretch on Wall  - 1 x daily - 7 x weekly - 1 sets - 5 reps - 10 sec hold - Standing Soleus Stretch  - 1 x daily - 7 x weekly - 1 sets - 5 reps - 10 sec hold - Kettlebell Squat  - 1 x daily - 7 x weekly - 3 sets - 10 reps ASSESSMENT:  CLINICAL IMPRESSION: Jamaiyah continues to progress appropriately.  She is very dedicated and diligent with her HEP and all that she has been taught.  Her ankle mobility is still limited but she had less difficulty with squat to pick up.  She was able to hip hinge more effectively now that she has been more diligent with her ankle mobility stretches.  She continues to work out at her gym with confidence.  She would benefit from continuing skilled therapy to address the deficits below and move closer to her goal related activities.    OBJECTIVE IMPAIRMENTS: decreased balance, decreased knowledge of condition, difficulty walking, decreased ROM, decreased strength, increased fascial restrictions, impaired flexibility, improper body mechanics, postural dysfunction, and pain.   ACTIVITY LIMITATIONS: lifting, bending, standing, squatting, stairs, transfers, toileting, and caring for others  PARTICIPATION LIMITATIONS: meal prep, cleaning, laundry, driving, shopping, community activity, and yard work  PERSONAL FACTORS: Fitness and Past/current experiences are also affecting patient's functional outcome.   REHAB POTENTIAL: Good  CLINICAL DECISION MAKING: Stable/uncomplicated  EVALUATION COMPLEXITY:  Low   GOALS: Goals reviewed with patient? Yes  SHORT TERM GOALS: Target date: 01/15/2024  Patient will be independent with initial HEP  Baseline: Goal status: MET 01/30/24  2.  Pain report to be no greater than 4/10  Baseline:  Goal status: MET 01/09/24   LONG TERM GOALS: Target date: 02/12/2024  Patient to report pain no greater than 2/10  Baseline:  Goal status: MET 01/23/24  2.  Patient to be independent with advanced HEP  Baseline:  Goal status: Progressing   3.  Patient to be able to bend, stoop and squat with controlled decent safely Baseline:  Goal status: MET 01/30/24  4.  No falls during episode of PT Baseline:  Goal status: MET 01/30/24  5.  Patient to demonstrate upright posture for 100 feet without vc's Baseline:  Goal status: MET 02/27/24  6.  Patient to demonstrate proper alignment with 5 times sit to stand under 20 sec Baseline:  Goal status: IN progress    PLAN:  PT FREQUENCY: 1x/week  PT DURATION: 8 weeks  PLANNED INTERVENTIONS: 97110-Therapeutic exercises, 97530- Therapeutic activity, 97112- Neuromuscular re-education, 97535- Self Care, 02859- Manual therapy, [phone]- Gait training, [phone]- Aquatic Therapy, Patient/Family education, Balance training, Stair training, Taping, Joint mobilization, Vestibular training, Visual/preceptual remediation/compensation, DME instructions, and Cryotherapy  PLAN FOR NEXT SESSION:  Continue proximal hip strengthening, deeper squat and ankle mobility, work on step-downs, balance, eccentric control, core strength.  Pt will continue with every other week.   Delon B. Tiffane Sheldon, PT 02/27/24 5:08 PM [facility] [address] [place], [place] [address] Phone # [phone] Fax [phone]

## 2024-03-26 ENCOUNTER — Ambulatory Visit: Attending: Rheumatology

## 2024-03-26 DIAGNOSIS — R293 Abnormal posture: Secondary | ICD-10-CM | POA: Insufficient documentation

## 2024-03-26 DIAGNOSIS — R252 Cramp and spasm: Secondary | ICD-10-CM | POA: Insufficient documentation

## 2024-03-26 DIAGNOSIS — R262 Difficulty in walking, not elsewhere classified: Secondary | ICD-10-CM | POA: Diagnosis not present

## 2024-03-26 DIAGNOSIS — M6281 Muscle weakness (generalized): Secondary | ICD-10-CM | POA: Diagnosis not present

## 2024-03-26 NOTE — Therapy (Signed)
 OUTPATIENT PHYSICAL THERAPY LOWER EXTREMITY TREATMENT NOTE   Patient Name: Terri Hudson MRN: [id number] DOB:19-Mar-1951, 73 y.o., female Today's Date: 03/26/2024  END OF SESSION:  PT End of Session - 03/26/24 1405     Visit Number 8    Date for Recertification  04/08/24    Authorization Type [employer] Medicare    Progress Note Due on Visit 10    PT Start Time 1405    PT Stop Time 1446    PT Time Calculation (min) 41 min    Activity Tolerance Patient tolerated treatment well    Behavior During Therapy [facility] for tasks assessed/performed            Past Medical History:  Diagnosis Date   Leukopenia    Migraine    Osteoarthritis    Osteoporosis 02/12/2018   Polymyalgia rheumatica    Ulcerative colitis (HCC)    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HCC)    Past Surgical History:  Procedure Laterality Date   COLONOSCOPY     HAND SURGERY Left    2015 pt reports cutting her hand accidentally and needing exploratory surgery to check for nerve damage   KNEE SURGERY Right    Patient Active Problem List   Diagnosis Date Noted   Small fiber neuropathy 06/20/2021   Family history of amyloidosis 06/20/2021   Numbness and tingling of foot 07/28/2020   Rheumatoid arthritis involving multiple sites with positive rheumatoid factor (HCC) 06/15/2020   High risk medication use 11/20/2017   On prednisone  therapy 11/20/2017   Primary osteoarthritis of both knees 11/20/2017   Spondylosis of lumbar spine 11/20/2017   Ulcerative colitis (HCC) 11/20/2017   Abnormal laboratory test 11/20/2017   Polymyalgia rheumatica 06/07/2017    PCP: Dr. Chrystal  REFERRING PROVIDER: Dolphus Reiter, MD  REFERRING DIAG: R26.89 (ICD-10-CM) - Poor balance  THERAPY DIAG:  Difficulty in walking, not elsewhere classified  Muscle weakness (generalized)  Cramp and spasm  Abnormal posture  Rationale for Evaluation and Treatment: Rehabilitation  ONSET DATE: 11/20/2023  SUBJECTIVE:    SUBJECTIVE STATEMENT: Patient reports she is doing well.  She has been away from PT for a while because she took a trip to [place].  We did a lot of walking and I did well.      PERTINENT HISTORY: PMR history PAIN:  02/27/24 Are you having pain? Yes: NPRS scale: 0/10 Pain location: legs and feet Pain description: aching, like a tight stocking is on them Aggravating factors: trying to sleep Relieving factors: movement  PRECAUTIONS: Fall  RED FLAGS: None   WEIGHT BEARING RESTRICTIONS: No  FALLS:  Has patient fallen in last 6 months? No  LIVING ENVIRONMENT: Lives with: lives with their spouse Lives in: House/apartment Stairs: Yes: Internal: 14 steps; on right going up and External: 3 steps; none Has following equipment at home: None  OCCUPATION: Civil engineer, contracting: chief financial officer, sitting for long periods of time  PLOF: Independent  PATIENT GOALS: to be able to sit without plopping   NEXT MD VISIT: October  OBJECTIVE:  Note: Objective measures were completed at Evaluation unless otherwise noted.  DIAGNOSTIC FINDINGS: na  PATIENT SURVEYS:  LEFS  Extreme difficulty/unable (0), Quite a bit of difficulty (1), Moderate difficulty (2), Little difficulty (3), No difficulty (4) Survey date:  12/18/23 02/13/24  Any of your usual work, housework or school activities 4 4  2. Usual hobbies, recreational or sporting activities 4 4  3. Getting into/out of the bath 4 4  4. Walking between rooms 4 4  5. Putting on socks/shoes 4 4  6. Squatting  0 0  7. Lifting an object, like a bag of groceries from the floor 2 4  8. Performing light activities around your home 4 4  9. Performing heavy activities around your home 2 2  10. Getting into/out of a car 3 3  11. Walking 2 blocks 4 4  12. Walking 1 mile 4 4  13. Going up/down 10 stairs (1 flight) 4 4  14. Standing for 1 hour 3 3  15.  sitting for 1 hour 4 4  16. Running on even ground 3 3  17. Running on uneven ground 2 2   18. Making sharp turns while running fast 0 0  19. Hopping  2 2  20. Rolling over in bed 4 4  Score total:  61 63     COGNITION: Overall cognitive status: Within functional limits for tasks assessed     SENSATION: WFL Patient has diagnosed neuropathy per her report  POSTURE: rounded shoulders, forward head, and increased thoracic kyphosis  PALPATION: na  LOWER EXTREMITY ROM:  WFL  LOWER EXTREMITY MMT:  MMT Right eval Left eval Right 02/13/24 Left 02/13/24  Hip flexion 4 4 4+ 4+  Hip extension 4- 4- 4 4  Hip abduction 4 4 4+ 4+  Hip adduction 4+ 4+ 4+ 4+  Hip internal rotation 4- 4- 4 4  Hip external rotation 4- 4- 4 4  Knee flexion 4+ 4+ 4+ 4+  Knee extension 4+ 4+ 4+ 4+  Ankle dorsiflexion 4+ 4+ 4+ 4+  Ankle plantarflexion      Ankle inversion      Ankle eversion       (Blank rows = not tested)   FUNCTIONAL TESTS:  Initial eval: 5 times sit to stand: 12.76 sec Timed up and go (TUG): 7.94 sec  02/13/24: 5 times sit to stand: 9.36 sec Timed up and go (TUG): 6.55 sec  GAIT: Distance walked: 30 feet Assistive device utilized: None Level of assistance: Modified independence Comments: short step length                                                                                                                                TREATMENT DATE:  03/26/24 Nustep x 5 min level 5 (PT present to discuss progress and status) Rocker board x 2 min Lunge to BOSU x 10 each LE fwd then lateral Star excursion with slider x 5 each LE (vc' when standing on right to maintain alignment) Squat to 16 side of rit fit box x 10 Squat to pick up teclor bars x 10 (decreased vc's and education on proper technique/ hip hinging) Gastroc stretch on rocker board 10 x 10 sec Soleus stretch on rocker board 5 x each LE  Seated clam with black loop x 20 Supine clam with black loop x 20 Side lying clam with yellow loop 2 x 10 each side  02/27/24 Nustep x 5 min level 4 Star  excursion with slider x 5 each LE (vc' when standing on right to maintain alignment) Squat to pick up teclor bars x 10 (decreased vc's and education on proper technique/ hip hinging) Squat to 16 side of rit fit box trying to do squat to touch not full sit 2 x 10 Rocker board x 2 min Gastroc stretch on rocker board 10 x 10 sec Soleus stretch on rocker board 5 x each LE  Lunge to BOSU x 10 each LE fwd then lateral Seated clam with black loop x 20 Supine clam with black loop x 20 Side lying clam with yellow loop 2 x 10 each side  02/13/24 Recert assessment completed Lunge to BOSU x 10 each LE fwd then lateral Lateral band walks x 3 laps of 10 steps each Alternating tap ups on cone x 20 Step up and hold on balance pad x 10 each LE fwd then lateral  Squat to pick up teclor bars x 10 (heavy vc's and education on proper technique/ hip hinging) Rocker board x 2 min Gastroc stretch on rocker board 10 x 10 sec Soleus stretch on rocker board 5 x each LE    PATIENT EDUCATION:  Education details: Initiated HEP, educated on various contributors to balance issues, various treatments for neuropathy, safety considerations and fall prevention. Person educated: Patient Education method: Explanation, Demonstration, Verbal cues, and Handouts Education comprehension: verbalized understanding, returned demonstration, and verbal cues required  HOME EXERCISE PROGRAM: Access Code: [email or web address] URL: [email or web address] Date: 02/13/2024 Prepared by: Delon Haddock  Program Notes With the squat position, you can do regular squats 2 x 10, speed squats 2 x 30 sec and squat hold x 10 holding 10 seconds with a 2 sec pause at top. Also try quad burners x 20 or 2 x 10  Exercises - Seated Hip Abduction with Resistance  - 1 x daily - 7 x weekly - 3 sets - 10 reps - Hooklying Isometric Clamshell  - 1 x daily - 7 x weekly - 3 sets - 10 reps - Clamshell with Resistance  - 1 x daily - 7 x weekly - 3  sets - 10 reps - Sidelying Diagonal Hip Abduction  - 1 x daily - 7 x weekly - 3 sets - 10 reps - Side Stepping with Resistance at Ankles  - 1 x daily - 7 x weekly - 3 sets - 10 reps - Squat with Chair Touch  - 1 x daily - 7 x weekly - 3 sets - 10 reps - Sitting Knee Extension with Resistance  - 2 x daily - 7 x weekly - 2 sets - 10 reps - 5 hold - Quadruped Fire Hydrant  - 1 x daily - 7 x weekly - 10 reps - Wall Sit  - 1 x daily - 7 x weekly - 1 sets - 5 reps - 30 seconds  hold - Forward Step Down  - 2 x daily - 7 x weekly - 10 reps - Gastroc Stretch on Wall  - 1 x daily - 7 x weekly - 1 sets - 5 reps - 10 sec hold - Standing Soleus Stretch  - 1 x daily - 7 x weekly - 1 sets - 5 reps - 10 sec hold - Kettlebell Squat  - 1 x daily - 7 x weekly - 3 sets - 10 reps ASSESSMENT:  CLINICAL IMPRESSION: Sukanya was able to go on her trip and do all the walking  that she needed to do while she was there.  She had no issues with balance or near falls.  She completed all tasks today with good tolerance.  She would benefit from her last few visits of skilled PT to meet final goals.     OBJECTIVE IMPAIRMENTS: decreased balance, decreased knowledge of condition, difficulty walking, decreased ROM, decreased strength, increased fascial restrictions, impaired flexibility, improper body mechanics, postural dysfunction, and pain.   ACTIVITY LIMITATIONS: lifting, bending, standing, squatting, stairs, transfers, toileting, and caring for others  PARTICIPATION LIMITATIONS: meal prep, cleaning, laundry, driving, shopping, community activity, and yard work  PERSONAL FACTORS: Fitness and Past/current experiences are also affecting patient's functional outcome.   REHAB POTENTIAL: Good  CLINICAL DECISION MAKING: Stable/uncomplicated  EVALUATION COMPLEXITY: Low   GOALS: Goals reviewed with patient? Yes  SHORT TERM GOALS: Target date: 01/15/2024  Patient will be independent with initial HEP  Baseline: Goal status:  MET 01/30/24  2.  Pain report to be no greater than 4/10  Baseline:  Goal status: MET 01/09/24   LONG TERM GOALS: Target date: 02/12/2024  Patient to report pain no greater than 2/10  Baseline:  Goal status: MET 01/23/24  2.  Patient to be independent with advanced HEP  Baseline:  Goal status: Progressing   3.  Patient to be able to bend, stoop and squat with controlled decent safely Baseline:  Goal status: MET 01/30/24  4.  No falls during episode of PT Baseline:  Goal status: MET 01/30/24  5.  Patient to demonstrate upright posture for 100 feet without vc's Baseline:  Goal status: MET 02/27/24  6.  Patient to demonstrate proper alignment with 5 times sit to stand under 20 sec Baseline:  Goal status: IN progress    PLAN:  PT FREQUENCY: 1x/week  PT DURATION: 8 weeks  PLANNED INTERVENTIONS: 97110-Therapeutic exercises, 97530- Therapeutic activity, 97112- Neuromuscular re-education, 97535- Self Care, [id number]- Manual therapy, [phone]- Gait training, [phone]- Aquatic Therapy, Patient/Family education, Balance training, Stair training, Taping, Joint mobilization, Vestibular training, Visual/preceptual remediation/compensation, DME instructions, and Cryotherapy  PLAN FOR NEXT SESSION:  Continue proximal hip strengthening, deeper squat and ankle mobility, work on step-downs, balance, eccentric control, core strength.  Pt will continue with every other week.   Delon B. Jayce Boyko, PT 03/26/24 2:51 PM [facility] [address] [place], [place] [address] Phone # [phone] Fax [phone]

## 2024-04-03 DIAGNOSIS — L720 Epidermal cyst: Secondary | ICD-10-CM | POA: Diagnosis not present

## 2024-04-03 DIAGNOSIS — L820 Inflamed seborrheic keratosis: Secondary | ICD-10-CM | POA: Diagnosis not present

## 2024-04-08 ENCOUNTER — Ambulatory Visit

## 2024-04-08 DIAGNOSIS — R262 Difficulty in walking, not elsewhere classified: Secondary | ICD-10-CM

## 2024-04-08 DIAGNOSIS — R293 Abnormal posture: Secondary | ICD-10-CM | POA: Diagnosis not present

## 2024-04-08 DIAGNOSIS — M6281 Muscle weakness (generalized): Secondary | ICD-10-CM | POA: Diagnosis not present

## 2024-04-08 DIAGNOSIS — R252 Cramp and spasm: Secondary | ICD-10-CM | POA: Diagnosis not present

## 2024-04-08 NOTE — Therapy (Signed)
 OUTPATIENT PHYSICAL THERAPY LOWER EXTREMITY TREATMENT/RECERT NOTE   Patient Name: Terri Hudson MRN: [id number] DOB:Jul 16, 1950, 73 y.o., female Today's Date: 04/08/2024  END OF SESSION:  PT End of Session - 04/08/24 1407     Visit Number 9    Date for Recertification  04/08/24    Authorization Type [employer]    Progress Note Due on Visit 10    PT Start Time 1404    PT Stop Time 1448    PT Time Calculation (min) 44 min    Activity Tolerance Patient tolerated treatment well    Behavior During Therapy [facility] for tasks assessed/performed            Past Medical History:  Diagnosis Date   Leukopenia    Migraine    Osteoarthritis    Osteoporosis 02/12/2018   Polymyalgia rheumatica    Ulcerative colitis (HCC)    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HCC)    Past Surgical History:  Procedure Laterality Date   COLONOSCOPY     HAND SURGERY Left    2015 pt reports cutting her hand accidentally and needing exploratory surgery to check for nerve damage   KNEE SURGERY Right    Patient Active Problem List   Diagnosis Date Noted   Small fiber neuropathy 06/20/2021   Family history of amyloidosis 06/20/2021   Numbness and tingling of foot 07/28/2020   Rheumatoid arthritis involving multiple sites with positive rheumatoid factor (HCC) 06/15/2020   High risk medication use 11/20/2017   On prednisone  therapy 11/20/2017   Primary osteoarthritis of both knees 11/20/2017   Spondylosis of lumbar spine 11/20/2017   Ulcerative colitis (HCC) 11/20/2017   Abnormal laboratory test 11/20/2017   Polymyalgia rheumatica 06/07/2017    PCP: Dr. Chrystal  REFERRING PROVIDER: Dolphus Reiter, MD  REFERRING DIAG: R26.89 (ICD-10-CM) - Poor balance  THERAPY DIAG:  Muscle weakness (generalized) - Plan: PT plan of care cert/re-cert  Difficulty in walking, not elsewhere classified - Plan: PT plan of care cert/re-cert  Cramp and spasm - Plan: PT plan of care cert/re-cert  Abnormal  posture - Plan: PT plan of care cert/re-cert  Rationale for Evaluation and Treatment: Rehabilitation  ONSET DATE: 11/20/2023  SUBJECTIVE:   SUBJECTIVE STATEMENT: Patient reports she is incorporating more of her balance training in her workouts but feels like she still needs guidance.  She states she still feels that it is a struggle descending steps and squatting.    PERTINENT HISTORY: PMR history PAIN:  04/08/24 Are you having pain? Yes: NPRS scale: 0/10 Pain location: legs and feet Pain description: aching, like a tight stocking is on them Aggravating factors: trying to sleep Relieving factors: movement  PRECAUTIONS: Fall  RED FLAGS: None   WEIGHT BEARING RESTRICTIONS: No  FALLS:  Has patient fallen in last 6 months? No  LIVING ENVIRONMENT: Lives with: lives with their spouse Lives in: House/apartment Stairs: Yes: Internal: 14 steps; on right going up and External: 3 steps; none Has following equipment at home: None  OCCUPATION: Civil engineer, contracting: chief financial officer, sitting for long periods of time  PLOF: Independent  PATIENT GOALS: to be able to sit without plopping   NEXT MD VISIT: October  OBJECTIVE:  Note: Objective measures were completed at Evaluation unless otherwise noted.  DIAGNOSTIC FINDINGS: na  PATIENT SURVEYS:  LEFS  Extreme difficulty/unable (0), Quite a bit of difficulty (1), Moderate difficulty (2), Little difficulty (3), No difficulty (4) Survey date:  12/18/23 02/13/24 04/08/24 (completed on ipad)  Any of your usual work, housework or  school activities 4 4   2. Usual hobbies, recreational or sporting activities 4 4   3. Getting into/out of the bath 4 4   4. Walking between rooms 4 4   5. Putting on socks/shoes 4 4   6. Squatting  0 0   7. Lifting an object, like a bag of groceries from the floor 2 4   8. Performing light activities around your home 4 4   9. Performing heavy activities around your home 2 2   10. Getting into/out of a  car 3 3   11. Walking 2 blocks 4 4   12. Walking 1 mile 4 4   13. Going up/down 10 stairs (1 flight) 4 4   14. Standing for 1 hour 3 3   15.  sitting for 1 hour 4 4   16. Running on even ground 3 3   17. Running on uneven ground 2 2   18. Making sharp turns while running fast 0 0   19. Hopping  2 2   20. Rolling over in bed 4 4   Score total:  61 63 60     COGNITION: Overall cognitive status: Within functional limits for tasks assessed     SENSATION: WFL Patient has diagnosed neuropathy per her report  POSTURE: rounded shoulders, forward head, and increased thoracic kyphosis  PALPATION: na  LOWER EXTREMITY ROM:  WFL  LOWER EXTREMITY MMT:  MMT Right eval Left eval Right 02/13/24 Left 02/13/24 Right 04/08/24 Left 04/08/24  Hip flexion 4 4 4+ 4+ 4+ 5  Hip extension 4- 4- 4 4 4 4   Hip abduction 4 4 4+ 4+ 4+ 4+  Hip adduction 4+ 4+ 4+ 4+ 5 5  Hip internal rotation 4- 4- 4 4 5 5   Hip external rotation 4- 4- 4 4 5 5   Knee flexion 4+ 4+ 4+ 4+ 5 5  Knee extension 4+ 4+ 4+ 4+ 5 5  Ankle dorsiflexion 4+ 4+ 4+ 4+ 5 5  Ankle plantarflexion        Ankle inversion        Ankle eversion         (Blank rows = not tested)   FUNCTIONAL TESTS:  Initial eval: 5 times sit to stand: 12.76 sec Timed up and go (TUG): 7.94 sec  02/13/24: 5 times sit to stand: 9.36 sec Timed up and go (TUG): 6.55 sec  04/08/24: 5 times sit to stand: 8.70 sec Timed up and go (TUG): 5.97 sec  GAIT: Distance walked: 30 feet Assistive device utilized: None Level of assistance: Modified independence Comments: short step length                                                                                                                                TREATMENT DATE:  04/08/24 Recumbent bike x 5 min level 5 (PT present to discuss progress and status) Recert assessment completed Hip extension 2  x 10 both Bridging x 20 Bridging with clam with red loop x 20 Hip abduction in bridge with red  loop 2 x 10 Side lying clam with red loop 2 x 10 each side Reviewed all of HEP and provided handouts for new exercises.   03/26/24 Nustep x 5 min level 5 (PT present to discuss progress and status) Rocker board x 2 min Lunge to BOSU x 10 each LE fwd then lateral Star excursion with slider x 5 each LE (vc' when standing on right to maintain alignment) Squat to 16 side of rit fit box x 10 Squat to pick up teclor bars x 10 (decreased vc's and education on proper technique/ hip hinging) Gastroc stretch on rocker board 10 x 10 sec Soleus stretch on rocker board 5 x each LE  Seated clam with black loop x 20 Supine clam with black loop x 20 Side lying clam with yellow loop 2 x 10 each side   02/27/24 Nustep x 5 min level 4 Star excursion with slider x 5 each LE (vc' when standing on right to maintain alignment) Squat to pick up teclor bars x 10 (decreased vc's and education on proper technique/ hip hinging) Squat to 16 side of rit fit box trying to do squat to touch not full sit 2 x 10 Rocker board x 2 min Gastroc stretch on rocker board 10 x 10 sec Soleus stretch on rocker board 5 x each LE  Lunge to BOSU x 10 each LE fwd then lateral Seated clam with black loop x 20 Supine clam with black loop x 20 Side lying clam with yellow loop 2 x 10 each side   PATIENT EDUCATION:  Education details: Initiated HEP, educated on various contributors to balance issues, various treatments for neuropathy, safety considerations and fall prevention. Person educated: Patient Education method: Explanation, Demonstration, Verbal cues, and Handouts Education comprehension: verbalized understanding, returned demonstration, and verbal cues required  HOME EXERCISE PROGRAM: Access Code: [email or web address] URL: [email or web address] Date: 04/08/2024 Prepared by: Delon Haddock  Program Notes With the squat position, you can do regular squats 2 x 10, speed squats 2 x 30 sec and squat hold x 10  holding 10 seconds with a 2 sec pause at top. Also try quad burners x 20 or 2 x 10  Exercises - Seated Hip Abduction with Resistance  - 1 x daily - 7 x weekly - 3 sets - 10 reps - Hooklying Isometric Clamshell  - 1 x daily - 7 x weekly - 3 sets - 10 reps - Clamshell with Resistance  - 1 x daily - 7 x weekly - 3 sets - 10 reps - Sidelying Diagonal Hip Abduction  - 1 x daily - 7 x weekly - 3 sets - 10 reps - Side Stepping with Resistance at Ankles  - 1 x daily - 7 x weekly - 3 sets - 10 reps - Squat with Chair Touch  - 1 x daily - 7 x weekly - 3 sets - 10 reps - Sitting Knee Extension with Resistance  - 2 x daily - 7 x weekly - 2 sets - 10 reps - 5 hold - Quadruped Fire Hydrant  - 1 x daily - 7 x weekly - 10 reps - Wall Sit  - 1 x daily - 7 x weekly - 1 sets - 5 reps - 30 seconds  hold - Forward Step Down  - 2 x daily - 7 x weekly - 10 reps - Gastroc Stretch on Wall  -  1 x daily - 7 x weekly - 1 sets - 5 reps - 10 sec hold - Standing Soleus Stretch  - 1 x daily - 7 x weekly - 1 sets - 5 reps - 10 sec hold - Kettlebell Squat  - 1 x daily - 7 x weekly - 3 sets - 10 reps - Supine Bridge  - 1 x daily - 7 x weekly - 2 sets - 10 reps - Bridge with Hip Abduction and Resistance - Ground Touches  - 1 x daily - 7 x weekly - 2 sets - 10 reps - Quadruped Bent Leg Hip Extension  - 1 x daily - 7 x weekly - 2 sets - 10 reps ASSESSMENT:  CLINICAL IMPRESSION: Terri Hudson is progressing well but still has some minor functional weaknesses that are lingering.  She is now able to do controlled decent on stand to sit even at lower sitting surfaces.  Her functional scores have improved.  Her LEFS is actually slightly less but patient admits the scale is hard to understand.  Lylith has met most of her goals but may benefit from continuing for a few more visits to address any lingering weakness.  She is well motivated and compliant.  She should continue to do well.      OBJECTIVE IMPAIRMENTS: decreased balance, decreased  knowledge of condition, difficulty walking, decreased ROM, decreased strength, increased fascial restrictions, impaired flexibility, improper body mechanics, postural dysfunction, and pain.   ACTIVITY LIMITATIONS: lifting, bending, standing, squatting, stairs, transfers, toileting, and caring for others  PARTICIPATION LIMITATIONS: meal prep, cleaning, laundry, driving, shopping, community activity, and yard work  PERSONAL FACTORS: Fitness and Past/current experiences are also affecting patient's functional outcome.   REHAB POTENTIAL: Good  CLINICAL DECISION MAKING: Stable/uncomplicated  EVALUATION COMPLEXITY: Low   GOALS: Goals reviewed with patient? Yes  SHORT TERM GOALS: Target date: 01/15/2024  Patient will be independent with initial HEP  Baseline: Goal status: MET 01/30/24  2.  Pain report to be no greater than 4/10  Baseline:  Goal status: MET 01/09/24   LONG TERM GOALS: Target date: 02/12/2024  Patient to report pain no greater than 2/10  Baseline:  Goal status: MET 01/23/24  2.  Patient to be independent with advanced HEP  Baseline:  Goal status: Progressing   3.  Patient to be able to bend, stoop and squat with controlled decent safely Baseline:  Goal status: MET 01/30/24  4.  No falls during episode of PT Baseline:  Goal status: MET 01/30/24  5.  Patient to demonstrate upright posture for 100 feet without vc's Baseline:  Goal status: MET 02/27/24  6.  Patient to demonstrate proper alignment with 5 times sit to stand under 20 sec Baseline:  Goal status: IN progress    PLAN:  PT FREQUENCY: 1x/week  PT DURATION: 8 weeks  PLANNED INTERVENTIONS: 97110-Therapeutic exercises, 97530- Therapeutic activity, 97112- Neuromuscular re-education, 97535- Self Care, [id number]- Manual therapy, [phone]- Gait training, [phone]- Aquatic Therapy, Patient/Family education, Balance training, Stair training, Taping, Joint mobilization, Vestibular training, Visual/preceptual  remediation/compensation, DME instructions, and Cryotherapy  PLAN FOR NEXT SESSION:  Continue proximal hip strengthening, deeper squat and ankle mobility, work on step-downs, balance, eccentric control, core strength.  Pt will continue for 4 more visits over an 8 week period to insure she is focusing on the correct exercises at her local fitness facility.     Delon B. Rafan Sanders, PT 04/08/24 3:02 PM [facility] [address] [place], [place] [address] Phone #  [phone] Fax [phone]

## 2024-04-12 ENCOUNTER — Encounter: Payer: Self-pay | Admitting: Internal Medicine

## 2024-04-18 DIAGNOSIS — J988 Other specified respiratory disorders: Secondary | ICD-10-CM | POA: Diagnosis not present

## 2024-04-18 DIAGNOSIS — M0579 Rheumatoid arthritis with rheumatoid factor of multiple sites without organ or systems involvement: Secondary | ICD-10-CM | POA: Diagnosis not present

## 2024-04-21 NOTE — Progress Notes (Unsigned)
 Office Visit Note  Patient: Terri Hudson             Date of Birth: 11-May-1951           MRN: [id number]             PCP: Chrystal Lamarr RAMAN, MD Referring: Chrystal Lamarr RAMAN, MD Visit Date: 05/01/2024 Occupation: Data Unavailable  Subjective:  Increased joint pain  History of Present Illness: Laloni Rowton is a 73 y.o. female with seropositive rheumatoid arthritis, polymyalgia rheumatica and osteoarthritis.  She returns today after her last visit in July 2025.  She discontinued leflunomide  end of June 2025.  She continues to be on sulfasalazine  500 mg 2 tablets a day.  She states she has been experiencing some stiffness and discomfort in her joints recently.  She is uncertain if it is due to weather change or due to flare of her disease.  She notices discomfort in her hands, knee joints and her hips.  She has not had any flares of ulcerative colitis. She noticed improvement in her lower extremity muscle strength after physical therapy.   Activities of Daily Living:  Patient reports morning stiffness for 20 minutes.   Patient Denies nocturnal pain.  Difficulty dressing/grooming: Denies Difficulty climbing stairs: Denies Difficulty getting out of chair: Denies Difficulty using hands for taps, buttons, cutlery, and/or writing: Reports  Review of Systems  Constitutional:  Negative for fatigue.  HENT:  Negative for mouth sores and mouth dryness.   Eyes:  Negative for dryness.  Respiratory:  Negative for difficulty breathing.   Cardiovascular:  Negative for chest pain and palpitations.  Gastrointestinal:  Negative for blood in stool, constipation and diarrhea.  Endocrine: Negative for increased urination.  Genitourinary:  Negative for difficulty urinating.  Musculoskeletal:  Positive for joint pain, joint pain and morning stiffness. Negative for gait problem, joint swelling, myalgias and myalgias.  Skin:  Positive for color change. Negative for rash and  sensitivity to sunlight.  Allergic/Immunologic: Negative for susceptible to infections.  Neurological:  Negative for dizziness and headaches.  Hematological:  Negative for swollen glands.  Psychiatric/Behavioral:  Negative for depressed mood and sleep disturbance. The patient is not nervous/anxious.     PMFS History:  Patient Active Problem List   Diagnosis Date Noted   Small fiber neuropathy 06/20/2021   Family history of amyloidosis 06/20/2021   Numbness and tingling of foot 07/28/2020   Rheumatoid arthritis involving multiple sites with positive rheumatoid factor (HCC) 06/15/2020   High risk medication use 11/20/2017   On prednisone  therapy 11/20/2017   Primary osteoarthritis of both knees 11/20/2017   Spondylosis of lumbar spine 11/20/2017   Ulcerative colitis (HCC) 11/20/2017   Abnormal laboratory test 11/20/2017   Polymyalgia rheumatica 06/07/2017    Past Medical History:  Diagnosis Date   Leukopenia    Migraine    Osteoarthritis    Osteoporosis 02/12/2018   Polymyalgia rheumatica    Ulcerative colitis (HCC)    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HCC)     Family History  Problem Relation Age of Onset   Heart disease Mother    Osteoporosis Father    Barrett's esophagus Father    Cancer Father    Heart disease Father    Dermatomyositis Sister    Cancer Sister    Dislocations Maternal Grandmother    Migraines Daughter    Bowel Disease Son    Colon cancer Neg Hx    Colon polyps Neg Hx    Esophageal cancer Neg Hx  Stomach cancer Neg Hx    Rectal cancer Neg Hx    Neuropathy Neg Hx    Past Surgical History:  Procedure Laterality Date   COLONOSCOPY     HAND SURGERY Left    2015 pt reports cutting her hand accidentally and needing exploratory surgery to check for nerve damage   KNEE SURGERY Right    Social History   Tobacco Use   Smoking status: Never    Passive exposure: Never   Smokeless tobacco: Never  Vaping Use   Vaping status: Never Used  Substance Use  Topics   Alcohol use: Yes    Alcohol/week: 8.0 standard drinks of alcohol    Types: 7 Glasses of wine, 1 Standard drinks or equivalent per week    Comment: 1 glass/day   Drug use: Never   Social History   Social History Narrative   Lives at home with husband   Right handed   Caffeine: 2 cups/day     Immunization History  Administered Date(s) Administered   Fluad Quad(high Dose 65+) 01/14/2019   INFLUENZA, HIGH DOSE SEASONAL PF 03/06/2018, 01/28/2020, 03/04/2021   PFIZER Comirnaty(Gray Top)Covid-19 Tri-Sucrose Vaccine 09/30/2020   PFIZER(Purple Top)SARS-COV-2 Vaccination 06/04/2019, 06/25/2019, 02/05/2020   Pfizer Covid-19 Vaccine Bivalent Booster 54yrs & up 02/11/2021   Pfizer(Comirnaty)Fall Seasonal Vaccine 12 years and older 02/14/2024   Unspecified SARS-COV-2 Vaccination 02/06/2022     Objective: Vital Signs: BP (!) 140/92   Pulse 71   Temp (!) 97.2 F (36.2 C)   Resp 15   Ht 5' 8 (1.727 m)   Wt 146 lb 3.2 oz (66.3 kg)   BMI 22.23 kg/m    Physical Exam Vitals and nursing note reviewed.  Constitutional:      Appearance: She is well-developed.  HENT:     Head: Normocephalic and atraumatic.  Eyes:     Conjunctiva/sclera: Conjunctivae normal.  Cardiovascular:     Rate and Rhythm: Normal rate and regular rhythm.     Heart sounds: Normal heart sounds.  Pulmonary:     Effort: Pulmonary effort is normal.     Breath sounds: Normal breath sounds.  Abdominal:     General: Bowel sounds are normal.     Palpations: Abdomen is soft.  Musculoskeletal:     Cervical back: Normal range of motion.  Skin:    General: Skin is warm and dry.     Capillary Refill: Capillary refill takes less than 2 seconds.  Neurological:     Mental Status: She is alert and oriented to person, place, and time.  Psychiatric:        Behavior: Behavior normal.      Musculoskeletal Exam: Cervical, thoracic and lumbar spine were in good range of motion.  There was no SI joint tenderness.   Shoulder joints, elbow joints, wrist joints, MCPs, PIPs and DIPs were in good range of motion with no synovitis.  Bilateral PIP and DIP thickening was noted.  Hip joints and knee joints were in good range of motion without any warmth swelling or effusion.  There was no tenderness over ankles or MTPs.   CDAI Exam: CDAI Score: -- Patient Global: 20 / 100; Provider Global: 10 / 100 Swollen: --; Tender: -- Joint Exam 05/01/2024   No joint exam has been documented for this visit   There is currently no information documented on the homunculus. Go to the Rheumatology activity and complete the homunculus joint exam.  Investigation: No additional findings.  Imaging: No results found.  Recent  Labs: Lab Results  Component Value Date   WBC 6.2 11/20/2023   HGB 13.2 11/20/2023   PLT 287 11/20/2023   NA 138 11/20/2023   K 4.5 11/20/2023   CL 102 11/20/2023   CO2 28 11/20/2023   GLUCOSE 103 (H) 11/20/2023   BUN 16 11/20/2023   CREATININE 0.87 11/20/2023   BILITOT 0.8 11/20/2023   ALKPHOS 84 08/26/2019   AST 25 11/20/2023   ALT 19 11/20/2023   PROT 7.0 11/20/2023   ALBUMIN 4.6 08/26/2019   CALCIUM 10.3 11/20/2023   GFRAA 79 09/29/2020   QFTBGOLDPLUS NEGATIVE 08/26/2019    Speciality Comments: No specialty comments available.  Procedures:  No procedures performed Allergies: Patient has no known allergies.   Assessment / Plan:     Visit Diagnoses: Rheumatoid arthritis involving multiple sites with positive rheumatoid factor , +CCP Ab: Patient reports increasing stiffness recently.  She also noticed some discomfort in her hands knees and hips.  No synovitis was noted on the examination.  She has been off leflunomide  since June.  She had thickening of bilateral PIP and DIP joints.  I discussed the symptoms probably are related to underlying osteoarthritis.  High risk medication use - sulfasalazine  500 mg 2 tablets p.o.daily (prescribed by Dr. Albertus).  She has been off Areva 10 mg  p.o. daily since June 2025.  I will check labs today which will include CBC with differential and CMP with GFR.  Labs were advised every 3 months.  Information reimmunization was placed in the AVS.  Polymyalgia rheumatica - Off prednisone  since March 2022.  Other ulcerative colitis with other complication (HCC) -asymptomatic.  Patient does followed by Dr. Albertus  Primary osteoarthritis of both knees-she complains of some stiffness in her knees.  No warmth swelling or effusion was noted.  She had benefit from physical therapy and has noticed improvement in her muscle strengthening.  Spondylosis of lumbar spine -she had no tenderness in the lumbar spine today.  Thoracic kyphosis was noted.  Some of the exercises were demonstrated in the office.  Followed by neurosurgery and neurologist.  Age-related osteoporosis without current pathological fracture - DEXA updated on 09/13/2022: Left femoral neck BMD 0.581 with T-score -2.4.  Taking vitamin D  1000 units daily.  Will discuss repeat DEXA scan at the follow-up visit.  Vitamin D  deficiency-she has been taking vitamin D .  Vitamin D  was normal at 48 on November 20, 2023.  Small fiber neuropathy  Poor balance -improved after physical therapy.  Dyslipidemia-July 2025 LDL was 107.  Orders: Orders Placed This Encounter  Procedures   CBC with Differential/Platelet   Comprehensive metabolic panel with GFR   No orders of the defined types were placed in this encounter.    Follow-Up Instructions: Return in about 5 months (around 09/29/2024) for Rheumatoid arthritis.   Maya Nash, MD  Note - This record has been created using Animal nutritionist.  Chart creation errors have been sought, but may not always  have been located. Such creation errors do not reflect on  the standard of medical care.

## 2024-04-30 ENCOUNTER — Ambulatory Visit: Attending: Rheumatology

## 2024-04-30 DIAGNOSIS — M6281 Muscle weakness (generalized): Secondary | ICD-10-CM | POA: Diagnosis present

## 2024-04-30 DIAGNOSIS — R252 Cramp and spasm: Secondary | ICD-10-CM | POA: Insufficient documentation

## 2024-04-30 DIAGNOSIS — R262 Difficulty in walking, not elsewhere classified: Secondary | ICD-10-CM | POA: Insufficient documentation

## 2024-04-30 DIAGNOSIS — R293 Abnormal posture: Secondary | ICD-10-CM | POA: Insufficient documentation

## 2024-04-30 NOTE — Therapy (Signed)
 OUTPATIENT PHYSICAL THERAPY LOWER EXTREMITY TREATMENT Progress Note Reporting Period 12/18/23 to 04/30/24  See note below for Objective Data and Assessment of Progress/Goals.        Patient Name: Terri Hudson MRN: [id number] DOB:1950/06/09, 73 y.o., female Today's Date: 04/30/2024  END OF SESSION:  PT End of Session - 04/30/24 1407     Visit Number 10    Date for Recertification  06/04/24    Authorization Type [employer]    Progress Note Due on Visit 20    PT Start Time 1400    PT Stop Time 1443    PT Time Calculation (min) 43 min    Activity Tolerance Patient tolerated treatment well    Behavior During Therapy [facility] for tasks assessed/performed            Past Medical History:  Diagnosis Date   Leukopenia    Migraine    Osteoarthritis    Osteoporosis 02/12/2018   Polymyalgia rheumatica    Ulcerative colitis (HCC)    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HCC)    Past Surgical History:  Procedure Laterality Date   COLONOSCOPY     HAND SURGERY Left    2015 pt reports cutting her hand accidentally and needing exploratory surgery to check for nerve damage   KNEE SURGERY Right    Patient Active Problem List   Diagnosis Date Noted   Small fiber neuropathy 06/20/2021   Family history of amyloidosis 06/20/2021   Numbness and tingling of foot 07/28/2020   Rheumatoid arthritis involving multiple sites with positive rheumatoid factor (HCC) 06/15/2020   High risk medication use 11/20/2017   On prednisone  therapy 11/20/2017   Primary osteoarthritis of both knees 11/20/2017   Spondylosis of lumbar spine 11/20/2017   Ulcerative colitis (HCC) 11/20/2017   Abnormal laboratory test 11/20/2017   Polymyalgia rheumatica 06/07/2017    PCP: Dr. Chrystal  REFERRING PROVIDER: Dolphus Reiter, MD  REFERRING DIAG: R26.89 (ICD-10-CM) - Poor balance  THERAPY DIAG:  Muscle weakness (generalized)  Difficulty in walking, not elsewhere classified  Cramp and  spasm  Abnormal posture  Rationale for Evaluation and Treatment: Rehabilitation  ONSET DATE: 11/20/2023  SUBJECTIVE:   SUBJECTIVE STATEMENT: Patient reports just a little achy with her arthritis due to the cold weather.    PERTINENT HISTORY: PMR history PAIN:  04/30/24 Are you having pain? Yes: NPRS scale: 0/10 Pain location: legs and feet Pain description: aching, like a tight stocking is on them Aggravating factors: trying to sleep Relieving factors: movement  PRECAUTIONS: Fall  RED FLAGS: None   WEIGHT BEARING RESTRICTIONS: No  FALLS:  Has patient fallen in last 6 months? No  LIVING ENVIRONMENT: Lives with: lives with their spouse Lives in: House/apartment Stairs: Yes: Internal: 14 steps; on right going up and External: 3 steps; none Has following equipment at home: None  OCCUPATION: Civil engineer, contracting: chief financial officer, sitting for long periods of time  PLOF: Independent  PATIENT GOALS: to be able to sit without plopping   NEXT MD VISIT: October  OBJECTIVE:  Note: Objective measures were completed at Evaluation unless otherwise noted.  DIAGNOSTIC FINDINGS: na  PATIENT SURVEYS:  LEFS  Extreme difficulty/unable (0), Quite a bit of difficulty (1), Moderate difficulty (2), Little difficulty (3), No difficulty (4) Survey date:  12/18/23 02/13/24 04/08/24 (completed on ipad)  Any of your usual work, housework or school activities 4 4   2. Usual hobbies, recreational or sporting activities 4 4   3. Getting into/out of the bath 4 4  4. Walking between rooms 4 4   5. Putting on socks/shoes 4 4   6. Squatting  0 0   7. Lifting an object, like a bag of groceries from the floor 2 4   8. Performing light activities around your home 4 4   9. Performing heavy activities around your home 2 2   10. Getting into/out of a car 3 3   11. Walking 2 blocks 4 4   12. Walking 1 mile 4 4   13. Going up/down 10 stairs (1 flight) 4 4   14. Standing for 1 hour 3 3   15.   sitting for 1 hour 4 4   16. Running on even ground 3 3   17. Running on uneven ground 2 2   18. Making sharp turns while running fast 0 0   19. Hopping  2 2   20. Rolling over in bed 4 4   Score total:  61 63 60     COGNITION: Overall cognitive status: Within functional limits for tasks assessed     SENSATION: WFL Patient has diagnosed neuropathy per her report  POSTURE: rounded shoulders, forward head, and increased thoracic kyphosis  PALPATION: na  LOWER EXTREMITY ROM:  WFL  LOWER EXTREMITY MMT:  MMT Right eval Left eval Right 02/13/24 Left 02/13/24 Right 04/08/24 Left 04/08/24  Hip flexion 4 4 4+ 4+ 4+ 5  Hip extension 4- 4- 4 4 4 4   Hip abduction 4 4 4+ 4+ 4+ 4+  Hip adduction 4+ 4+ 4+ 4+ 5 5  Hip internal rotation 4- 4- 4 4 5 5   Hip external rotation 4- 4- 4 4 5 5   Knee flexion 4+ 4+ 4+ 4+ 5 5  Knee extension 4+ 4+ 4+ 4+ 5 5  Ankle dorsiflexion 4+ 4+ 4+ 4+ 5 5  Ankle plantarflexion        Ankle inversion        Ankle eversion         (Blank rows = not tested)   FUNCTIONAL TESTS:  Initial eval: 5 times sit to stand: 12.76 sec Timed up and go (TUG): 7.94 sec  02/13/24: 5 times sit to stand: 9.36 sec Timed up and go (TUG): 6.55 sec  04/08/24: 5 times sit to stand: 8.70 sec Timed up and go (TUG): 5.97 sec  GAIT: Distance walked: 30 feet Assistive device utilized: None Level of assistance: Modified independence Comments: short step length                                                                                                                                TREATMENT DATE:  04/30/24 Nustep x 5 min level 5 (PT present to discuss progress and status) Rocker board x 2 min Gastroc stretch on rocker board 10 x 10 sec Soleus stretch on rocker board 5 x each LE Lunge to BOSU x 10 each LE fwd then lateral Star  excursion with slider x 5 each LE (vc' when standing on right to maintain alignment) Squat to 16 side of rit fit box x 10 Squat to  pick up teclor bars x 10 (decreased vc's and education on proper technique/ hip hinging) Sit to stand with red loop around knees to encourage alignment Squat to chair with red loop around knees to encourage alignment Seated clam with black loop x 20 BOSU squats 2 x 10 with supervision and some cga  04/08/24 Recumbent bike x 5 min level 5 (PT present to discuss progress and status) Recert assessment completed Hip extension 2 x 10 both Bridging x 20 Bridging with clam with red loop x 20 Hip abduction in bridge with red loop 2 x 10 Side lying clam with red loop 2 x 10 each side Reviewed all of HEP and provided handouts for new exercises.   03/26/24 Nustep x 5 min level 5 (PT present to discuss progress and status) Rocker board x 2 min Lunge to BOSU x 10 each LE fwd then lateral Star excursion with slider x 5 each LE (vc' when standing on right to maintain alignment) Squat to 16 side of rit fit box x 10 Squat to pick up teclor bars x 10 (decreased vc's and education on proper technique/ hip hinging) Gastroc stretch on rocker board 10 x 10 sec Soleus stretch on rocker board 5 x each LE  Seated clam with black loop x 20 Supine clam with black loop x 20 Side lying clam with yellow loop 2 x 10 each side   PATIENT EDUCATION:  Education details: Initiated HEP, educated on various contributors to balance issues, various treatments for neuropathy, safety considerations and fall prevention. Person educated: Patient Education method: Explanation, Demonstration, Verbal cues, and Handouts Education comprehension: verbalized understanding, returned demonstration, and verbal cues required  HOME EXERCISE PROGRAM: Access Code: [email or web address] URL: [email or web address] Date: 04/08/2024 Prepared by: Delon Haddock  Program Notes With the squat position, you can do regular squats 2 x 10, speed squats 2 x 30 sec and squat hold x 10 holding 10 seconds with a 2 sec pause at top. Also try  quad burners x 20 or 2 x 10  Exercises - Seated Hip Abduction with Resistance  - 1 x daily - 7 x weekly - 3 sets - 10 reps - Hooklying Isometric Clamshell  - 1 x daily - 7 x weekly - 3 sets - 10 reps - Clamshell with Resistance  - 1 x daily - 7 x weekly - 3 sets - 10 reps - Sidelying Diagonal Hip Abduction  - 1 x daily - 7 x weekly - 3 sets - 10 reps - Side Stepping with Resistance at Ankles  - 1 x daily - 7 x weekly - 3 sets - 10 reps - Squat with Chair Touch  - 1 x daily - 7 x weekly - 3 sets - 10 reps - Sitting Knee Extension with Resistance  - 2 x daily - 7 x weekly - 2 sets - 10 reps - 5 hold - Quadruped Fire Hydrant  - 1 x daily - 7 x weekly - 10 reps - Wall Sit  - 1 x daily - 7 x weekly - 1 sets - 5 reps - 30 seconds  hold - Forward Step Down  - 2 x daily - 7 x weekly - 10 reps - Gastroc Stretch on Wall  - 1 x daily - 7 x weekly - 1 sets - 5 reps - 10 sec  hold - Standing Soleus Stretch  - 1 x daily - 7 x weekly - 1 sets - 5 reps - 10 sec hold - Kettlebell Squat  - 1 x daily - 7 x weekly - 3 sets - 10 reps - Supine Bridge  - 1 x daily - 7 x weekly - 2 sets - 10 reps - Bridge with Hip Abduction and Resistance - Ground Touches  - 1 x daily - 7 x weekly - 2 sets - 10 reps - Quadruped Bent Leg Hip Extension  - 1 x daily - 7 x weekly - 2 sets - 10 reps ASSESSMENT:  CLINICAL IMPRESSION: Zyanne seems to be maintaining her program fairly well.  She was a little achy due to her general OA due to the cold weather.  She was able to complete all tasks with ease today, however.   She is well motivated and compliant.  She should continue to do well.   She would benefit from continuing skilled PT to progress toward full independence of advanced HEP.     OBJECTIVE IMPAIRMENTS: decreased balance, decreased knowledge of condition, difficulty walking, decreased ROM, decreased strength, increased fascial restrictions, impaired flexibility, improper body mechanics, postural dysfunction, and pain.    ACTIVITY LIMITATIONS: lifting, bending, standing, squatting, stairs, transfers, toileting, and caring for others  PARTICIPATION LIMITATIONS: meal prep, cleaning, laundry, driving, shopping, community activity, and yard work  PERSONAL FACTORS: Fitness and Past/current experiences are also affecting patient's functional outcome.   REHAB POTENTIAL: Good  CLINICAL DECISION MAKING: Stable/uncomplicated  EVALUATION COMPLEXITY: Low   GOALS: Goals reviewed with patient? Yes  SHORT TERM GOALS: Target date: 01/15/2024  Patient will be independent with initial HEP  Baseline: Goal status: MET 01/30/24  2.  Pain report to be no greater than 4/10  Baseline:  Goal status: MET 01/09/24   LONG TERM GOALS: Target date: 02/12/2024  Patient to report pain no greater than 2/10  Baseline:  Goal status: MET 01/23/24  2.  Patient to be independent with advanced HEP  Baseline:  Goal status: Progressing   3.  Patient to be able to bend, stoop and squat with controlled decent safely Baseline:  Goal status: MET 01/30/24  4.  No falls during episode of PT Baseline:  Goal status: MET 01/30/24  5.  Patient to demonstrate upright posture for 100 feet without vc's Baseline:  Goal status: MET 02/27/24  6.  Patient to demonstrate proper alignment with 5 times sit to stand under 20 sec Baseline:  Goal status: IN progress    PLAN:  PT FREQUENCY: 1x/week  PT DURATION: 8 weeks  PLANNED INTERVENTIONS: 97110-Therapeutic exercises, 97530- Therapeutic activity, 97112- Neuromuscular re-education, 97535- Self Care, [id number]- Manual therapy, [phone]- Gait training, [phone]- Aquatic Therapy, Patient/Family education, Balance training, Stair training, Taping, Joint mobilization, Vestibular training, Visual/preceptual remediation/compensation, DME instructions, and Cryotherapy  PLAN FOR NEXT SESSION:  Continue proximal hip strengthening, deeper squat and ankle mobility, work on step-downs, balance, eccentric  control, core strength.  Pt will continue for 4 more visits over an 8 week period to insure she is focusing on the correct exercises at her local fitness facility.     Delon B. Rudolf Blizard, PT 04/30/2024 2:46 PM [facility] [address] [place], [place] [address] Phone # [phone] Fax [phone]

## 2024-05-01 ENCOUNTER — Ambulatory Visit: Admitting: Rheumatology

## 2024-05-01 ENCOUNTER — Encounter: Payer: Self-pay | Admitting: Rheumatology

## 2024-05-01 VITALS — BP 140/92 | HR 71 | Temp 97.2°F | Resp 15 | Ht 68.0 in | Wt 146.2 lb

## 2024-05-01 DIAGNOSIS — M353 Polymyalgia rheumatica: Secondary | ICD-10-CM

## 2024-05-01 DIAGNOSIS — Z79899 Other long term (current) drug therapy: Secondary | ICD-10-CM | POA: Diagnosis not present

## 2024-05-01 DIAGNOSIS — G629 Polyneuropathy, unspecified: Secondary | ICD-10-CM | POA: Diagnosis not present

## 2024-05-01 DIAGNOSIS — E559 Vitamin D deficiency, unspecified: Secondary | ICD-10-CM | POA: Diagnosis not present

## 2024-05-01 DIAGNOSIS — E785 Hyperlipidemia, unspecified: Secondary | ICD-10-CM | POA: Diagnosis not present

## 2024-05-01 DIAGNOSIS — R2689 Other abnormalities of gait and mobility: Secondary | ICD-10-CM

## 2024-05-01 DIAGNOSIS — M0579 Rheumatoid arthritis with rheumatoid factor of multiple sites without organ or systems involvement: Secondary | ICD-10-CM

## 2024-05-01 DIAGNOSIS — M170 Bilateral primary osteoarthritis of knee: Secondary | ICD-10-CM

## 2024-05-01 DIAGNOSIS — M47816 Spondylosis without myelopathy or radiculopathy, lumbar region: Secondary | ICD-10-CM | POA: Diagnosis not present

## 2024-05-01 DIAGNOSIS — M810 Age-related osteoporosis without current pathological fracture: Secondary | ICD-10-CM

## 2024-05-01 DIAGNOSIS — K51818 Other ulcerative colitis with other complication: Secondary | ICD-10-CM

## 2024-05-01 NOTE — Patient Instructions (Signed)
 Standing Labs We placed an order today for your standing lab work.   Please have your standing labs drawn in  March and every 3 months  Please have your labs drawn 2 weeks prior to your appointment so that the provider can discuss your lab results at your appointment, if possible.  Please note that you may see your imaging and lab results in MyChart before we have reviewed them. We will contact you once all results are reviewed. Please allow our office up to 72 hours to thoroughly review all of the results before contacting the office for clarification of your results.  WALK-IN LAB HOURS  Monday through Thursday from 8:00 am - 4:30 pm and Friday from 8:00 am-12:00 pm.  Patients with office visits requiring labs will be seen before walk-in labs.  You may encounter longer than normal wait times. Please allow additional time. Wait times may be shorter on  Monday and Thursday afternoons.  We do not book appointments for walk-in labs. We appreciate your patience and understanding with our staff.   Labs are drawn by [facility]. Please bring your co-pay at the time of your lab draw.  You may receive a bill from [facility] for your lab work.  Please note if you are on Hydroxychloroquine and and an order has been placed for a Hydroxychloroquine level,  you will need to have it drawn 4 hours or more after your last dose.  If you wish to have your labs drawn at another location, please call the office 24 hours in advance so we can fax the orders.  The office is located at [address], [address], [place], [place] [address]   If you have any questions regarding directions or hours of operation,  please call [phone].   As a reminder, please drink plenty of water prior to coming for your lab work. Thanks!   Vaccines You are taking a medication(s) that can suppress your immune system.  The following immunizations are recommended: Flu annually Covid-19 RSV  Td/Tdap (tetanus, diphtheria, pertussis)  every 10 years Pneumonia (Prevnar 15 then Pneumovax 23 at least 1 year apart.  Alternatively, can take Prevnar 20 without needing additional dose) Shingrix: 2 doses from 4 weeks to 6 months apart  Please check with your PCP to make sure you are up to date.

## 2024-05-02 ENCOUNTER — Ambulatory Visit: Payer: Self-pay | Admitting: Rheumatology

## 2024-05-02 LAB — CBC WITH DIFFERENTIAL/PLATELET
Absolute Lymphocytes: 1594 {cells}/uL (ref 850–3900)
Absolute Monocytes: 770 {cells}/uL (ref 200–950)
Basophils Absolute: 23 {cells}/uL (ref 0–200)
Basophils Relative: 0.3 %
Eosinophils Absolute: 169 {cells}/uL (ref 15–500)
Eosinophils Relative: 2.2 %
HCT: 40.7 % (ref 35.9–46.0)
Hemoglobin: 13.4 g/dL (ref 11.7–15.5)
MCH: 30.9 pg (ref 27.0–33.0)
MCHC: 32.9 g/dL (ref 31.6–35.4)
MCV: 94 fL (ref 81.4–101.7)
MPV: 10.4 fL (ref 7.5–12.5)
Monocytes Relative: 10 %
Neutro Abs: 5144 {cells}/uL (ref 1500–7800)
Neutrophils Relative %: 66.8 %
Platelets: 359 Thousand/uL (ref 140–400)
RBC: 4.33 Million/uL (ref 3.80–5.10)
RDW: 11.4 % (ref 11.0–15.0)
Total Lymphocyte: 20.7 %
WBC: 7.7 Thousand/uL (ref 3.8–10.8)

## 2024-05-02 LAB — COMPREHENSIVE METABOLIC PANEL WITH GFR
AG Ratio: 1.7 (calc) (ref 1.0–2.5)
ALT: 14 U/L (ref 6–29)
AST: 18 U/L (ref 10–35)
Albumin: 4.4 g/dL (ref 3.6–5.1)
Alkaline phosphatase (APISO): 80 U/L (ref 37–153)
BUN: 20 mg/dL (ref 7–25)
CO2: 28 mmol/L (ref 20–32)
Calcium: 9.9 mg/dL (ref 8.6–10.4)
Chloride: 102 mmol/L (ref 98–110)
Creat: 0.83 mg/dL (ref 0.60–1.00)
Globulin: 2.6 g/dL (ref 1.9–3.7)
Glucose, Bld: 103 mg/dL — ABNORMAL HIGH (ref 65–99)
Potassium: 4.3 mmol/L (ref 3.5–5.3)
Sodium: 137 mmol/L (ref 135–146)
Total Bilirubin: 0.6 mg/dL (ref 0.2–1.2)
Total Protein: 7 g/dL (ref 6.1–8.1)
eGFR: 75 mL/min/1.73m2

## 2024-05-19 ENCOUNTER — Ambulatory Visit: Attending: Rheumatology

## 2024-05-19 DIAGNOSIS — R293 Abnormal posture: Secondary | ICD-10-CM | POA: Insufficient documentation

## 2024-05-19 DIAGNOSIS — R252 Cramp and spasm: Secondary | ICD-10-CM | POA: Insufficient documentation

## 2024-05-19 DIAGNOSIS — R262 Difficulty in walking, not elsewhere classified: Secondary | ICD-10-CM | POA: Diagnosis present

## 2024-05-19 DIAGNOSIS — M6281 Muscle weakness (generalized): Secondary | ICD-10-CM | POA: Insufficient documentation

## 2024-05-19 NOTE — Therapy (Signed)
 " OUTPATIENT PHYSICAL THERAPY LOWER EXTREMITY TREATMENT      Patient Name: Terri Hudson MRN: [id number] DOB:Oct 06, 1950, 74 y.o., female Today's Date: 05/19/2024  END OF SESSION:  PT End of Session - 05/19/24 1543     Visit Number 11    PT Start Time 1532    PT Stop Time 1614    PT Time Calculation (min) 42 min    Activity Tolerance Patient tolerated treatment well    Behavior During Therapy [facility] for tasks assessed/performed             Past Medical History:  Diagnosis Date   Leukopenia    Migraine    Osteoarthritis    Osteoporosis 02/12/2018   Polymyalgia rheumatica    Ulcerative colitis (HCC)    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HCC)    Past Surgical History:  Procedure Laterality Date   COLONOSCOPY     HAND SURGERY Left    2015 pt reports cutting her hand accidentally and needing exploratory surgery to check for nerve damage   KNEE SURGERY Right    Patient Active Problem List   Diagnosis Date Noted   Small fiber neuropathy 06/20/2021   Family history of amyloidosis 06/20/2021   Numbness and tingling of foot 07/28/2020   Rheumatoid arthritis involving multiple sites with positive rheumatoid factor (HCC) 06/15/2020   High risk medication use 11/20/2017   On prednisone  therapy 11/20/2017   Primary osteoarthritis of both knees 11/20/2017   Spondylosis of lumbar spine 11/20/2017   Ulcerative colitis (HCC) 11/20/2017   Abnormal laboratory test 11/20/2017   Polymyalgia rheumatica 06/07/2017    PCP: Dr. Chrystal  REFERRING PROVIDER: Dolphus Reiter, MD  REFERRING DIAG: R26.89 (ICD-10-CM) - Poor balance  THERAPY DIAG:  Muscle weakness (generalized)  Difficulty in walking, not elsewhere classified  Cramp and spasm  Abnormal posture  Rationale for Evaluation and Treatment: Rehabilitation  ONSET DATE: 11/20/2023  SUBJECTIVE:   SUBJECTIVE STATEMENT: Ready for D/C to HEP and gym exercises   PERTINENT HISTORY: PMR history PAIN:  05/19/24 Are  you having pain? Yes: NPRS scale: 0/10 Pain location: legs and feet Pain description: aching, like a tight stocking is on them Aggravating factors: trying to sleep Relieving factors: movement  PRECAUTIONS: Fall  RED FLAGS: None   WEIGHT BEARING RESTRICTIONS: No  FALLS:  Has patient fallen in last 6 months? No  LIVING ENVIRONMENT: Lives with: lives with their spouse Lives in: House/apartment Stairs: Yes: Internal: 14 steps; on right going up and External: 3 steps; none Has following equipment at home: None  OCCUPATION: Civil engineer, contracting: chief financial officer, sitting for long periods of time  PLOF: Independent  PATIENT GOALS: to be able to sit without plopping   NEXT MD VISIT: October  OBJECTIVE:  Note: Objective measures were completed at Evaluation unless otherwise noted.  DIAGNOSTIC FINDINGS: na  PATIENT SURVEYS:  LEFS  Extreme difficulty/unable (0), Quite a bit of difficulty (1), Moderate difficulty (2), Little difficulty (3), No difficulty (4) Survey date:  12/18/23 02/13/24 04/08/24 (completed on ipad)  Any of your usual work, housework or school activities 4 4   2. Usual hobbies, recreational or sporting activities 4 4   3. Getting into/out of the bath 4 4   4. Walking between rooms 4 4   5. Putting on socks/shoes 4 4   6. Squatting  0 0   7. Lifting an object, like a bag of groceries from the floor 2 4   8. Performing light activities around your home 4 4  9. Performing heavy activities around your home 2 2   10. Getting into/out of a car 3 3   11. Walking 2 blocks 4 4   12. Walking 1 mile 4 4   13. Going up/down 10 stairs (1 flight) 4 4   14. Standing for 1 hour 3 3   15.  sitting for 1 hour 4 4   16. Running on even ground 3 3   17. Running on uneven ground 2 2   18. Making sharp turns while running fast 0 0   19. Hopping  2 2   20. Rolling over in bed 4 4   Score total:  61 63 60     COGNITION: Overall cognitive status: Within functional limits  for tasks assessed     SENSATION: WFL Patient has diagnosed neuropathy per her report  POSTURE: rounded shoulders, forward head, and increased thoracic kyphosis  PALPATION: na  LOWER EXTREMITY ROM:  WFL  LOWER EXTREMITY MMT:  MMT Right eval Left eval Right 02/13/24 Left 02/13/24 Right 04/08/24 Left 04/08/24  Hip flexion 4 4 4+ 4+ 4+ 5  Hip extension 4- 4- 4 4 4 4   Hip abduction 4 4 4+ 4+ 4+ 4+  Hip adduction 4+ 4+ 4+ 4+ 5 5  Hip internal rotation 4- 4- 4 4 5 5   Hip external rotation 4- 4- 4 4 5 5   Knee flexion 4+ 4+ 4+ 4+ 5 5  Knee extension 4+ 4+ 4+ 4+ 5 5  Ankle dorsiflexion 4+ 4+ 4+ 4+ 5 5  Ankle plantarflexion        Ankle inversion        Ankle eversion         (Blank rows = not tested)   FUNCTIONAL TESTS:  Initial eval: 5 times sit to stand: 12.76 sec Timed up and go (TUG): 7.94 sec  02/13/24: 5 times sit to stand: 9.36 sec Timed up and go (TUG): 6.55 sec  04/08/24: 5 times sit to stand: 8.70 sec Timed up and go (TUG): 5.97 sec  GAIT: Distance walked: 30 feet Assistive device utilized: None Level of assistance: Modified independence Comments: short step length                                                                                                                                TREATMENT DATE:  05/19/24 Nustep x 5 min level 5 (PT present to discuss progress and status) Rocker board x 3 min Gastroc stretch on rocker board 2x20 seconds sec Soleus stretch on rocker board 5 x each LE Lunge to BOSU x 10 each LE fwd then lateral bil each  Star excursion with slider x 5 each LE (vc' when standing on right to maintain alignment) Squat to 16 side of rit fit box x 14 step down Rt and Lt x10- instability and hip adduction bil  Sit to stand with red loop around knees to encourage alignment  Squat to chair with red loop around knees to encourage alignment Seated clam with black loop x 20  04/30/24 Nustep x 5 min level 5 (PT present to discuss  progress and status) Rocker board x 2 min Gastroc stretch on rocker board 10 x 10 sec Soleus stretch on rocker board 5 x each LE Lunge to BOSU x 10 each LE fwd then lateral Star excursion with slider x 5 each LE (vc' when standing on right to maintain alignment) Squat to 16 side of rit fit box x 10 Squat to pick up teclor bars x 10 (decreased vc's and education on proper technique/ hip hinging) Sit to stand with red loop around knees to encourage alignment Squat to chair with red loop around knees to encourage alignment Seated clam with black loop x 20 BOSU squats 2 x 10 with supervision and some cga  04/08/24 Recumbent bike x 5 min level 5 (PT present to discuss progress and status) Recert assessment completed Hip extension 2 x 10 both Bridging x 20 Bridging with clam with red loop x 20 Hip abduction in bridge with red loop 2 x 10 Side lying clam with red loop 2 x 10 each side Reviewed all of HEP and provided handouts for new exercises.    PATIENT EDUCATION:  Education details: Initiated HEP, educated on various contributors to balance issues, various treatments for neuropathy, safety considerations and fall prevention. Person educated: Patient Education method: Explanation, Demonstration, Verbal cues, and Handouts Education comprehension: verbalized understanding, returned demonstration, and verbal cues required  HOME EXERCISE PROGRAM: Access Code: [email or web address] URL: [email or web address] Date: 04/08/2024 Prepared by: Delon Haddock  Program Notes With the squat position, you can do regular squats 2 x 10, speed squats 2 x 30 sec and squat hold x 10 holding 10 seconds with a 2 sec pause at top. Also try quad burners x 20 or 2 x 10  Exercises - Seated Hip Abduction with Resistance  - 1 x daily - 7 x weekly - 3 sets - 10 reps - Hooklying Isometric Clamshell  - 1 x daily - 7 x weekly - 3 sets - 10 reps - Clamshell with Resistance  - 1 x daily - 7 x weekly - 3 sets  - 10 reps - Sidelying Diagonal Hip Abduction  - 1 x daily - 7 x weekly - 3 sets - 10 reps - Side Stepping with Resistance at Ankles  - 1 x daily - 7 x weekly - 3 sets - 10 reps - Squat with Chair Touch  - 1 x daily - 7 x weekly - 3 sets - 10 reps - Sitting Knee Extension with Resistance  - 2 x daily - 7 x weekly - 2 sets - 10 reps - 5 hold - Quadruped Fire Hydrant  - 1 x daily - 7 x weekly - 10 reps - Wall Sit  - 1 x daily - 7 x weekly - 1 sets - 5 reps - 30 seconds  hold - Forward Step Down  - 2 x daily - 7 x weekly - 10 reps - Gastroc Stretch on Wall  - 1 x daily - 7 x weekly - 1 sets - 5 reps - 10 sec hold - Standing Soleus Stretch  - 1 x daily - 7 x weekly - 1 sets - 5 reps - 10 sec hold - Kettlebell Squat  - 1 x daily - 7 x weekly - 3 sets - 10 reps - Supine Bridge  - 1 x daily -  7 x weekly - 2 sets - 10 reps - Bridge with Hip Abduction and Resistance - Ground Touches  - 1 x daily - 7 x weekly - 2 sets - 10 reps - Quadruped Bent Leg Hip Extension  - 1 x daily - 7 x weekly - 2 sets - 10 reps ASSESSMENT:  CLINICAL IMPRESSION: Pt is ready to D/C to HEP.  Pt is challenged with eccentric control with step down and stand to sit transition.  Verbal cues for glute med activation to reduce hip adduction.  Pt is motivated and will continue with HEP and gym exercises to continue to improve her functional strength.  D/C to HEP today.     OBJECTIVE IMPAIRMENTS: decreased balance, decreased knowledge of condition, difficulty walking, decreased ROM, decreased strength, increased fascial restrictions, impaired flexibility, improper body mechanics, postural dysfunction, and pain.   ACTIVITY LIMITATIONS: lifting, bending, standing, squatting, stairs, transfers, toileting, and caring for others  PARTICIPATION LIMITATIONS: meal prep, cleaning, laundry, driving, shopping, community activity, and yard work  PERSONAL FACTORS: Fitness and Past/current experiences are also affecting patient's functional  outcome.   REHAB POTENTIAL: Good  CLINICAL DECISION MAKING: Stable/uncomplicated  EVALUATION COMPLEXITY: Low   GOALS: Goals reviewed with patient? Yes  SHORT TERM GOALS: Target date: 01/15/2024  Patient will be independent with initial HEP  Baseline: Goal status: MET 01/30/24  2.  Pain report to be no greater than 4/10  Baseline:  Goal status: MET 01/09/24   LONG TERM GOALS: Target date: 02/12/2024  Patient to report pain no greater than 2/10  Baseline:  Goal status: MET 01/23/24  2.  Patient to be independent with advanced HEP  Baseline:  Goal status: MET  3.  Patient to be able to bend, stoop and squat with controlled decent safely Baseline:  Goal status: MET 01/30/24  4.  No falls during episode of PT Baseline:  Goal status: MET 01/30/24  5.  Patient to demonstrate upright posture for 100 feet without vc's Baseline:  Goal status: MET 02/27/24  6.  Patient to demonstrate proper alignment with 5 times sit to stand under 20 sec Baseline:  Goal status: MET   PLAN: PHYSICAL THERAPY DISCHARGE SUMMARY  Visits from Start of Care: 11  Current functional level related to goals / functional outcomes: See above for current goal status   Remaining deficits: Pelvic instability and reduced eccentric control with step downs and stand to sit   Education / Equipment: HEP   Patient agrees to discharge. Patient goals were met. Patient is being discharged due to being pleased with the current functional level.      Burnard Joy, PT 05/19/2024 4:33 PM  [facility] [address] [place], [place] [address] Phone # [phone] Fax [phone]  "

## 2024-06-02 ENCOUNTER — Ambulatory Visit

## 2024-06-04 ENCOUNTER — Encounter: Payer: Self-pay | Admitting: Rheumatology

## 2024-06-04 NOTE — Progress Notes (Unsigned)
 "  Office Visit Note  Patient: Terri Hudson             Date of Birth: 01/03/1951           MRN: [id number]             PCP: Chrystal Lamarr RAMAN, MD Referring: Chrystal Lamarr RAMAN, MD Visit Date: 06/05/2024 Occupation: Data Unavailable  Subjective:  No chief complaint on file.   History of Present Illness: Terri Hudson is a 74 y.o. female ***     Activities of Daily Living:  Patient reports morning stiffness for *** {minute/hour:19697}.   Patient {ACTIONS;DENIES/REPORTS:21021675::Denies} nocturnal pain.  Difficulty dressing/grooming: {ACTIONS;DENIES/REPORTS:21021675::Denies} Difficulty climbing stairs: {ACTIONS;DENIES/REPORTS:21021675::Denies} Difficulty getting out of chair: {ACTIONS;DENIES/REPORTS:21021675::Denies} Difficulty using hands for taps, buttons, cutlery, and/or writing: {ACTIONS;DENIES/REPORTS:21021675::Denies}  No Rheumatology ROS completed.   PMFS History:  Patient Active Problem List   Diagnosis Date Noted   Small fiber neuropathy 06/20/2021   Family history of amyloidosis 06/20/2021   Numbness and tingling of foot 07/28/2020   Rheumatoid arthritis involving multiple sites with positive rheumatoid factor (HCC) 06/15/2020   High risk medication use 11/20/2017   On prednisone  therapy 11/20/2017   Primary osteoarthritis of both knees 11/20/2017   Spondylosis of lumbar spine 11/20/2017   Ulcerative colitis (HCC) 11/20/2017   Abnormal laboratory test 11/20/2017   Polymyalgia rheumatica 06/07/2017    Past Medical History:  Diagnosis Date   Leukopenia    Migraine    Osteoarthritis    Osteoporosis 02/12/2018   Polymyalgia rheumatica    Ulcerative colitis (HCC)    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HCC)     Family History  Problem Relation Age of Onset   Heart disease Mother    Osteoporosis Father    Barrett's esophagus Father    Cancer Father    Heart disease Father    Dermatomyositis Sister    Cancer Sister    Dislocations  Maternal Grandmother    Migraines Daughter    Bowel Disease Son    Colon cancer Neg Hx    Colon polyps Neg Hx    Esophageal cancer Neg Hx    Stomach cancer Neg Hx    Rectal cancer Neg Hx    Neuropathy Neg Hx    Past Surgical History:  Procedure Laterality Date   COLONOSCOPY     HAND SURGERY Left    2015 pt reports cutting her hand accidentally and needing exploratory surgery to check for nerve damage   KNEE SURGERY Right    Social History[1] Social History   Social History Narrative   Lives at home with husband   Right handed   Caffeine: 2 cups/day     Immunization History  Administered Date(s) Administered   Fluad Quad(high Dose 65+) 01/14/2019   INFLUENZA, HIGH DOSE SEASONAL PF 03/06/2018, 01/28/2020, 03/04/2021   PFIZER Comirnaty(Gray Top)Covid-19 Tri-Sucrose Vaccine 09/30/2020   PFIZER(Purple Top)SARS-COV-2 Vaccination 06/04/2019, 06/25/2019, 02/05/2020   Pfizer Covid-19 Vaccine Bivalent Booster 41yrs & up 02/11/2021   Pfizer(Comirnaty)Fall Seasonal Vaccine 12 years and older 02/14/2024   Unspecified SARS-COV-2 Vaccination 02/06/2022     Objective: Vital Signs: There were no vitals taken for this visit.   Physical Exam   Musculoskeletal Exam: ***  CDAI Exam: CDAI Score: -- Patient Global: --; Provider Global: -- Swollen: --; Tender: -- Joint Exam 06/05/2024   No joint exam has been documented for this visit   There is currently no information documented on the homunculus. Go to the Rheumatology activity and complete the homunculus joint exam.  Investigation: No additional findings.  Imaging: No results found.  Recent Labs: Lab Results  Component Value Date   WBC 7.7 05/01/2024   HGB 13.4 05/01/2024   PLT 359 05/01/2024   NA 137 05/01/2024   K 4.3 05/01/2024   CL 102 05/01/2024   CO2 28 05/01/2024   GLUCOSE 103 (H) 05/01/2024   BUN 20 05/01/2024   CREATININE 0.83 05/01/2024   BILITOT 0.6 05/01/2024   ALKPHOS 84 08/26/2019   AST 18  05/01/2024   ALT 14 05/01/2024   PROT 7.0 05/01/2024   ALBUMIN 4.6 08/26/2019   CALCIUM 9.9 05/01/2024   GFRAA 79 09/29/2020   QFTBGOLDPLUS NEGATIVE 08/26/2019    Speciality Comments: No specialty comments available.  Procedures:  No procedures performed Allergies: Patient has no known allergies.   Assessment / Plan:     Visit Diagnoses: No diagnosis found.  Orders: No orders of the defined types were placed in this encounter.  No orders of the defined types were placed in this encounter.   Face-to-face time spent with patient was *** minutes. Greater than 50% of time was spent in counseling and coordination of care.  Follow-Up Instructions: No follow-ups on file.   Daved JAYSON Gavel, CMA  Note - This record has been created using Animal nutritionist.  Chart creation errors have been sought, but may not always  have been located. Such creation errors do not reflect on  the standard of medical care.    [1]  Social History Tobacco Use   Smoking status: Never    Passive exposure: Never   Smokeless tobacco: Never  Vaping Use   Vaping status: Never Used  Substance Use Topics   Alcohol use: Yes    Alcohol/week: 8.0 standard drinks of alcohol    Types: 7 Glasses of wine, 1 Standard drinks or equivalent per week    Comment: 1 glass/day   Drug use: Never   "

## 2024-06-04 NOTE — Telephone Encounter (Signed)
 Please advise patient not to take prednisone  until she has labs.  The labs should include sed rate, CK, rheumatoid factor and anti-CCP.  Rheumatoid arthritis could be flaring as she stopped Arava .  If possible we should evaluate her in the office.

## 2024-06-05 ENCOUNTER — Encounter: Payer: Self-pay | Admitting: Rheumatology

## 2024-06-05 ENCOUNTER — Ambulatory Visit: Attending: Rheumatology | Admitting: Rheumatology

## 2024-06-05 VITALS — BP 150/73 | HR 75 | Temp 97.1°F | Resp 14 | Ht 67.0 in | Wt 144.2 lb

## 2024-06-05 DIAGNOSIS — M810 Age-related osteoporosis without current pathological fracture: Secondary | ICD-10-CM | POA: Diagnosis not present

## 2024-06-05 DIAGNOSIS — G629 Polyneuropathy, unspecified: Secondary | ICD-10-CM

## 2024-06-05 DIAGNOSIS — M170 Bilateral primary osteoarthritis of knee: Secondary | ICD-10-CM

## 2024-06-05 DIAGNOSIS — M47816 Spondylosis without myelopathy or radiculopathy, lumbar region: Secondary | ICD-10-CM

## 2024-06-05 DIAGNOSIS — M0579 Rheumatoid arthritis with rheumatoid factor of multiple sites without organ or systems involvement: Secondary | ICD-10-CM | POA: Diagnosis not present

## 2024-06-05 DIAGNOSIS — M353 Polymyalgia rheumatica: Secondary | ICD-10-CM

## 2024-06-05 DIAGNOSIS — E559 Vitamin D deficiency, unspecified: Secondary | ICD-10-CM | POA: Diagnosis not present

## 2024-06-05 DIAGNOSIS — K51818 Other ulcerative colitis with other complication: Secondary | ICD-10-CM | POA: Diagnosis not present

## 2024-06-05 DIAGNOSIS — R2689 Other abnormalities of gait and mobility: Secondary | ICD-10-CM

## 2024-06-05 DIAGNOSIS — E785 Hyperlipidemia, unspecified: Secondary | ICD-10-CM

## 2024-06-05 DIAGNOSIS — Z79899 Other long term (current) drug therapy: Secondary | ICD-10-CM | POA: Diagnosis not present

## 2024-06-05 MED ORDER — LEFLUNOMIDE 10 MG PO TABS: 10.0000 mg | ORAL_TABLET | Freq: Every day | ORAL | 0 refills | Status: AC

## 2024-06-05 MED ORDER — PREDNISONE 5 MG PO TABS: ORAL_TABLET | ORAL | 0 refills | Status: AC

## 2024-06-05 NOTE — Patient Instructions (Addendum)
 Take prednisone  5 mg tablet, 4 tablets by mouth every morning for 4 days, 3 tablets by mouth every morning for 4 days, 2 tablets by mouth every morning for 4 days, 1 tablet by mouth every morning for 4 days  Standing Labs We placed an order today for your standing lab work.   Please have your standing labs drawn in 1 month after starting leflunomide  and then every 3 months  Please have your labs drawn 2 weeks prior to your appointment so that the provider can discuss your lab results at your appointment, if possible.  Please note that you may see your imaging and lab results in MyChart before we have reviewed them. We will contact you once all results are reviewed. Please allow our office up to 72 hours to thoroughly review all of the results before contacting the office for clarification of your results.  WALK-IN LAB HOURS  Monday through Thursday from 8:00 am - 4:30 pm and Friday from 8:00 am-12:00 pm.  Patients with office visits requiring labs will be seen before walk-in labs.  You may encounter longer than normal wait times. Please allow additional time. Wait times may be shorter on  Monday and Thursday afternoons.  We do not book appointments for walk-in labs. We appreciate your patience and understanding with our staff.   Labs are drawn by [facility]. Please bring your co-pay at the time of your lab draw.  You may receive a bill from [facility] for your lab work.  Please note if you are on Hydroxychloroquine and and an order has been placed for a Hydroxychloroquine level,  you will need to have it drawn 4 hours or more after your last dose.  If you wish to have your labs drawn at another location, please call the office 24 hours in advance so we can fax the orders.  The office is located at [address], [place], [place] [address]   If you have any questions regarding directions or hours of operation,  please call [phone].   As a reminder, please drink plenty of water  prior to coming for your lab work. Thanks!    Leflunomide  Tablets What is this medication? LEFLUNOMIDE  (le FLOO na mide) treats the symptoms of rheumatoid arthritis. It works by slowing down an overactive immune system. This decreases inflammation. It belongs to a group of medications called DMARDs. This medicine may be used for other purposes; ask your health care provider or pharmacist if you have questions. COMMON BRAND NAME(S): Arava  What should I tell my care team before I take this medication? They need to know if you have any of these conditions: Cancer Diabetes High blood pressure Immune system problems Infection Kidney disease Liver disease Low blood cell levels (white cells, red cells, and platelets) Lung or breathing disease, such as asthma or COPD Recent or upcoming vaccine Skin conditions Tingling of the fingers or toes, or other nerve disorder An unusual or allergic reaction to leflunomide , other medications, food, dyes, or preservatives Pregnant or trying to get pregnant Breastfeeding How should I use this medication? Take this medication by mouth with a full glass of water. Take it as directed on the prescription label at the same time every day. Keep taking it unless your care team tells you to stop. Talk to your care team about the use of this medication in children. Special care may be needed. Overdosage: If you think you have taken too much of this medicine contact a poison control center or  emergency room at once. NOTE: This medicine is only for you. Do not share this medicine with others. What if I miss a dose? If you miss a dose, take it as soon as you can. If it is almost time for your next dose, take only that dose. Do not take double or extra doses. What may interact with this medication? Do not take this medication with any of the following: Teriflunomide This medication may also interact with the following: Alosetron Caffeine Cefaclor Certain  medications for diabetes, such as nateglinide, repaglinide, rosiglitazone, pioglitazone Certain medications for high cholesterol, such as atorvastatin, pravastatin, rosuvastatin, simvastatin Charcoal Cholestyramine Ciprofloxacin Duloxetine Estrogen and progestin hormones Furosemide Ketoprofen Live virus vaccines Medications that increase your risk for infection Methotrexate Mitoxantrone Paclitaxel Penicillin Theophylline Tizanidine Warfarin This list may not describe all possible interactions. Give your health care provider a list of all the medicines, herbs, non-prescription drugs, or dietary supplements you use. Also tell them if you smoke, drink alcohol, or use illegal drugs. Some items may interact with your medicine. What should I watch for while using this medication? Visit your care team for regular checks on your progress. Tell your care team if your symptoms do not start to get better or if they get worse. You may need blood work done while you are taking this medication. This medication may cause serious skin reactions. They can happen weeks to months after starting the medication. Contact your care team right away if you notice fevers or flu-like symptoms with a rash. The rash may be red or purple and then turn into blisters or peeling of the skin. You may also notice a red rash with swelling of the face, lips, or lymph nodes in your neck or under your arms. You should not receive certain vaccines during your treatment and for a certain time after your treatment with this medication ends. Talk to your care team for more information. This medication may stay in your body for up to 2 years after your last dose. Tell your care team about any unusual side effects or symptoms. A medication can be given to help lower your blood levels of this medication more quickly. Talk to your care team if you may be pregnant. This medication can cause serious birth defects if taken during pregnancy  and for a while after the last dose. You will need a negative pregnancy test before starting this medication. Contraception is recommended while taking this medication and for a while after the last dose. Your care team can help you find the option that works for you. Do not breastfeed while taking this medication. What side effects may I notice from receiving this medication? Side effects that you should report to your care team as soon as possible: Allergic reactions--skin rash, itching, hives, swelling of the face, lips, tongue, or throat Dry cough, shortness of breath or trouble breathing Increase in blood pressure Infection--fever, chills, cough, sore throat, wounds that don't heal, pain or trouble when passing urine, general feeling of discomfort or being unwell Redness, blistering, peeling, or loosening of the skin, including inside the mouth Liver injury--right upper belly pain, loss of appetite, nausea, light-colored stool, dark yellow or brown urine, yellowing skin or eyes, unusual weakness or fatigue Pain, tingling, or numbness in the hands or feet Unusual bruising or bleeding Side effects that usually do not require medical attention (report to your care team if they continue or are bothersome): Back pain Diarrhea Hair loss Headache Nausea This list may not  describe all possible side effects. Call your doctor for medical advice about side effects. You may report side effects to FDA at 1-800-FDA-1088. Where should I keep my medication? Keep out of the reach of children and pets. Store at room temperature between 20 and 25 degrees C (68 and 77 degrees F). Protect from moisture and light. Keep the container tightly closed. Get rid of any unused medication after the expiration date. To get rid of medications that are no longer needed or have expired: Take the medication to a medication take-back program. Check with your pharmacy or law enforcement to find a location. If you cannot  return the medication, ask your pharmacist or care team how to get rid of this medication safely. NOTE: This sheet is a summary. It may not cover all possible information. If you have questions about this medicine, talk to your doctor, pharmacist, or health care provider.  2024 Elsevier/Gold Standard (2021-09-29 00:00:00)  Vaccines You are taking a medication(s) that can suppress your immune system.  The following immunizations are recommended: Flu annually Covid-19  Td/Tdap (tetanus, diphtheria, pertussis) every 10 years Pneumonia (Prevnar 15 then Pneumovax 23 at least 1 year apart.  Alternatively, can take Prevnar 20 without needing additional dose) Shingrix: 2 doses from 4 weeks to 6 months apart  Please check with your PCP to make sure you are up to date.   If you have signs or symptoms of an infection or start antibiotics: First, call your PCP for workup of your infection. Hold your medication through the infection, until you complete your antibiotics, and until symptoms resolve if you take the following: Injectable medication (Actemra, Benlysta, Cimzia, Cosentyx, Enbrel, Humira, Kevzara, Orencia, Remicade, Simponi, Stelara, Taltz, Tremfya) Methotrexate Leflunomide  (Arava ) Mycophenolate (Cellcept) Xeljanz, Olumiant, or Rinvoq  Prednisone  Tablets What is this medication? PREDNISONE  (PRED ni sone) treats many conditions such as asthma, allergic reactions, arthritis, inflammatory bowel diseases, adrenal, and blood or bone marrow disorders. It works by decreasing inflammation, slowing down an overactive immune system, or replacing cortisol normally made in the body. Cortisol is a hormone that plays an important role in how the body responds to stress, illness, and injury. It belongs to a group of medications called steroids. This medicine may be used for other purposes; ask your health care provider or pharmacist if you have questions. COMMON BRAND NAME(S): Deltasone , Predone, Sterapred,  Sterapred DS What should I tell my care team before I take this medication? They need to know if you have any of these conditions: Cushing's syndrome Diabetes Glaucoma Heart disease High blood pressure Infection (especially a virus infection such as chickenpox, cold sores, or herpes) Kidney disease Liver disease Mental illness Myasthenia gravis Osteoporosis Seizures Stomach or intestine problems Thyroid  disease An unusual or allergic reaction to lactose, prednisone , other medications, foods, dyes, or preservatives Pregnant or trying to get pregnant Breast-feeding How should I use this medication? Take this medication by mouth with a glass of water. Follow the directions on the prescription label. Take this medication with food. If you are taking this medication once a day, take it in the morning. Do not take more medication than you are told to take. Do not suddenly stop taking your medication because you may develop a severe reaction. Your care team will tell you how much medication to take. If your care team wants you to stop the medication, the dose may be slowly lowered over time to avoid any side effects. Talk to your care team about the use of this  medication in children. Special care may be needed. Overdosage: If you think you have taken too much of this medicine contact a poison control center or emergency room at once. NOTE: This medicine is only for you. Do not share this medicine with others. What if I miss a dose? If you miss a dose, take it as soon as you can. If it is almost time for your next dose, talk to your care team. You may need to miss a dose or take an extra dose. Do not take double or extra doses without advice. What may interact with this medication? Do not take this medication with any of the following: Metyrapone Mifepristone This medication may also interact with the following: Aminoglutethimide Amphotericin B Aspirin and aspirin-like  medications Barbiturates Certain medications for diabetes, like glipizide or glyburide Cholestyramine Cholinesterase inhibitors Cyclosporine Digoxin Diuretics Ephedrine Female hormones, like estrogens and birth control pills Isoniazid Ketoconazole NSAIDS, medications for pain and inflammation, like ibuprofen or naproxen Phenytoin Rifampin Toxoids Vaccines Warfarin This list may not describe all possible interactions. Give your health care provider a list of all the medicines, herbs, non-prescription drugs, or dietary supplements you use. Also tell them if you smoke, drink alcohol, or use illegal drugs. Some items may interact with your medicine. What should I watch for while using this medication? Visit your care team for regular checks on your progress. If you are taking this medication over a prolonged period, carry an identification card with your name and address, the type and dose of your medication, and your care team's name and address. This medication may increase your risk of getting an infection. Tell your care team if you are around anyone with measles or chickenpox, or if you develop sores or blisters that do not heal properly. If you are going to have surgery, tell your care team that you have taken this medication within the last twelve months. Ask your care team about your diet. You may need to lower the amount of salt you eat. This medication may increase blood sugar. Ask your care team if changes in diet or medications are needed if you have diabetes. What side effects may I notice from receiving this medication? Side effects that you should report to your care team as soon as possible: Allergic reactions--skin rash, itching, hives, swelling of the face, lips, tongue, or throat Cushing syndrome--increased fat around the midsection, upper back, neck, or face, pink or purple stretch marks on the skin, thinning, fragile skin that easily bruises, unexpected hair growth High  blood sugar (hyperglycemia)--increased thirst or amount of urine, unusual weakness or fatigue, blurry vision Increase in blood pressure Infection--fever, chills, cough, sore throat, wounds that don't heal, pain or trouble when passing urine, general feeling of discomfort or being unwell Low adrenal gland function--nausea, vomiting, loss of appetite, unusual weakness or fatigue, dizziness Mood and behavior changes--anxiety, nervousness, confusion, hallucinations, irritability, hostility, thoughts of suicide or self-harm, worsening mood, feelings of depression Stomach bleeding--bloody or black, tar-like stools, vomiting blood or brown material that looks like coffee grounds Swelling of the ankles, hands, or feet Side effects that usually do not require medical attention (report to your care team if they continue or are bothersome): Acne General discomfort and fatigue Headache Increase in appetite Nausea Trouble sleeping Weight gain This list may not describe all possible side effects. Call your doctor for medical advice about side effects. You may report side effects to FDA at 1-800-FDA-1088. Where should I keep my medication? Keep out of the  reach of children. Store at room temperature between 15 and 30 degrees C (59 and 86 degrees F). Protect from light. Keep container tightly closed. Throw away any unused medication after the expiration date. NOTE: This sheet is a summary. It may not cover all possible information. If you have questions about this medicine, talk to your doctor, pharmacist, or health care provider.  2024 Elsevier/Gold Standard (2020-07-30 00:00:00)

## 2024-08-07 ENCOUNTER — Ambulatory Visit: Admitting: Physician Assistant

## 2024-10-07 ENCOUNTER — Ambulatory Visit: Admitting: Rheumatology
